# Patient Record
Sex: Female | Born: 1950
Health system: Southern US, Community
[De-identification: ages and names within clinical notes are randomized; demographics above are authoritative.]

## PROBLEM LIST (undated history)

## (undated) DIAGNOSIS — N189 Chronic kidney disease, unspecified: Secondary | ICD-10-CM

## (undated) DIAGNOSIS — F419 Anxiety disorder, unspecified: Secondary | ICD-10-CM

## (undated) DIAGNOSIS — G2581 Restless legs syndrome: Secondary | ICD-10-CM

## (undated) DIAGNOSIS — J9600 Acute respiratory failure, unspecified whether with hypoxia or hypercapnia: Secondary | ICD-10-CM

## (undated) DIAGNOSIS — F32A Depression, unspecified: Secondary | ICD-10-CM

## (undated) DIAGNOSIS — I10 Essential (primary) hypertension: Secondary | ICD-10-CM

## (undated) DIAGNOSIS — Z720 Tobacco use: Secondary | ICD-10-CM

## (undated) DIAGNOSIS — E785 Hyperlipidemia, unspecified: Secondary | ICD-10-CM

## (undated) DIAGNOSIS — J449 Chronic obstructive pulmonary disease, unspecified: Secondary | ICD-10-CM

## (undated) DIAGNOSIS — F329 Major depressive disorder, single episode, unspecified: Secondary | ICD-10-CM

## (undated) DIAGNOSIS — S83209A Unspecified tear of unspecified meniscus, current injury, unspecified knee, initial encounter: Secondary | ICD-10-CM

## (undated) DIAGNOSIS — E119 Type 2 diabetes mellitus without complications: Secondary | ICD-10-CM

## (undated) DIAGNOSIS — E669 Obesity, unspecified: Secondary | ICD-10-CM

## (undated) HISTORY — DX: Essential (primary) hypertension: I10

## (undated) HISTORY — DX: Depression, unspecified: F32.A

## (undated) HISTORY — DX: Hyperlipidemia, unspecified: E78.5

## (undated) HISTORY — DX: Acute respiratory failure, unspecified whether with hypoxia or hypercapnia: J96.00

## (undated) HISTORY — DX: Chronic obstructive pulmonary disease, unspecified: J44.9

## (undated) HISTORY — DX: Unspecified tear of unspecified meniscus, current injury, unspecified knee, initial encounter: S83.209A

## (undated) HISTORY — DX: Chronic kidney disease, unspecified: N18.9

## (undated) HISTORY — DX: Anxiety disorder, unspecified: F41.9

## (undated) HISTORY — DX: Restless legs syndrome: G25.81

## (undated) HISTORY — PX: OTHER SURGICAL HISTORY: SHX169

## (undated) HISTORY — DX: Obesity, unspecified: E66.9

## (undated) HISTORY — DX: Type 2 diabetes mellitus without complications: E11.9

## (undated) HISTORY — PX: CHOLECYSTECTOMY: SHX55

## (undated) HISTORY — DX: Major depressive disorder, single episode, unspecified: F32.9

## (undated) HISTORY — DX: Tobacco use: Z72.0

---

## 1982-09-28 HISTORY — PX: HERNIA REPAIR: SHX51

## 1982-09-28 HISTORY — PX: TUBAL LIGATION: SHX77

## 2007-01-07 ENCOUNTER — Ambulatory Visit: Payer: Self-pay | Admitting: General Surgery

## 2007-01-14 ENCOUNTER — Ambulatory Visit: Payer: Self-pay | Admitting: General Surgery

## 2013-09-28 DIAGNOSIS — J9600 Acute respiratory failure, unspecified whether with hypoxia or hypercapnia: Secondary | ICD-10-CM

## 2013-09-28 HISTORY — DX: Acute respiratory failure, unspecified whether with hypoxia or hypercapnia: J96.00

## 2013-12-13 ENCOUNTER — Inpatient Hospital Stay: Payer: Self-pay | Admitting: Family Medicine

## 2013-12-13 LAB — CBC
HCT: 35.3 % (ref 35.0–47.0)
HGB: 11 g/dL — ABNORMAL LOW (ref 12.0–16.0)
MCH: 28.7 pg (ref 26.0–34.0)
MCHC: 31.3 g/dL — ABNORMAL LOW (ref 32.0–36.0)
MCV: 92 fL (ref 80–100)
Platelet: 282 10*3/uL (ref 150–440)
RBC: 3.86 10*6/uL (ref 3.80–5.20)
RDW: 13.5 % (ref 11.5–14.5)
WBC: 26.6 10*3/uL — ABNORMAL HIGH (ref 3.6–11.0)

## 2013-12-13 LAB — PRO B NATRIURETIC PEPTIDE: B-Type Natriuretic Peptide: 191 pg/mL — ABNORMAL HIGH (ref 0–125)

## 2013-12-13 LAB — COMPREHENSIVE METABOLIC PANEL
Albumin: 3.2 g/dL — ABNORMAL LOW (ref 3.4–5.0)
Alkaline Phosphatase: 102 U/L
Anion Gap: 5 — ABNORMAL LOW (ref 7–16)
BUN: 29 mg/dL — ABNORMAL HIGH (ref 7–18)
Bilirubin,Total: 0.3 mg/dL (ref 0.2–1.0)
Calcium, Total: 9.5 mg/dL (ref 8.5–10.1)
Chloride: 93 mmol/L — ABNORMAL LOW (ref 98–107)
Co2: 36 mmol/L — ABNORMAL HIGH (ref 21–32)
Creatinine: 1.48 mg/dL — ABNORMAL HIGH (ref 0.60–1.30)
EGFR (African American): 44 — ABNORMAL LOW
EGFR (Non-African Amer.): 38 — ABNORMAL LOW
Glucose: 121 mg/dL — ABNORMAL HIGH (ref 65–99)
Osmolality: 275 (ref 275–301)
Potassium: 3 mmol/L — ABNORMAL LOW (ref 3.5–5.1)
SGOT(AST): 20 U/L (ref 15–37)
SGPT (ALT): 16 U/L (ref 12–78)
Sodium: 134 mmol/L — ABNORMAL LOW (ref 136–145)
Total Protein: 7.9 g/dL (ref 6.4–8.2)

## 2013-12-13 LAB — TROPONIN I: Troponin-I: <0.02 ng/mL

## 2013-12-14 LAB — BASIC METABOLIC PANEL
Anion Gap: 2 — ABNORMAL LOW (ref 7–16)
BUN: 32 mg/dL — ABNORMAL HIGH (ref 7–18)
Calcium, Total: 8.8 mg/dL (ref 8.5–10.1)
Chloride: 96 mmol/L — ABNORMAL LOW (ref 98–107)
Co2: 36 mmol/L — ABNORMAL HIGH (ref 21–32)
Creatinine: 1.51 mg/dL — ABNORMAL HIGH (ref 0.60–1.30)
EGFR (African American): 43 — ABNORMAL LOW
EGFR (Non-African Amer.): 37 — ABNORMAL LOW
Glucose: 200 mg/dL — ABNORMAL HIGH (ref 65–99)
Osmolality: 281 (ref 275–301)
Potassium: 3.3 mmol/L — ABNORMAL LOW (ref 3.5–5.1)
Sodium: 134 mmol/L — ABNORMAL LOW (ref 136–145)

## 2013-12-14 LAB — CBC WITH DIFFERENTIAL/PLATELET
Basophil #: 0 10*3/uL (ref 0.0–0.1)
Basophil %: 0.1 %
Eosinophil #: 0 10*3/uL (ref 0.0–0.7)
Eosinophil %: 0 %
HCT: 31.1 % — ABNORMAL LOW (ref 35.0–47.0)
HGB: 10.2 g/dL — ABNORMAL LOW (ref 12.0–16.0)
Lymphocyte #: 0.6 10*3/uL — ABNORMAL LOW (ref 1.0–3.6)
Lymphocyte %: 3.3 %
MCH: 30 pg (ref 26.0–34.0)
MCHC: 32.7 g/dL (ref 32.0–36.0)
MCV: 92 fL (ref 80–100)
Monocyte #: 0.2 x10 3/mm (ref 0.2–0.9)
Monocyte %: 1.3 %
Neutrophil #: 17.1 10*3/uL — ABNORMAL HIGH (ref 1.4–6.5)
Neutrophil %: 95.3 %
Platelet: 242 10*3/uL (ref 150–440)
RBC: 3.4 10*6/uL — ABNORMAL LOW (ref 3.80–5.20)
RDW: 12.8 % (ref 11.5–14.5)
WBC: 17.9 10*3/uL — ABNORMAL HIGH (ref 3.6–11.0)

## 2013-12-14 LAB — HEMOGLOBIN A1C: Hemoglobin A1C: 6.9 % — ABNORMAL HIGH (ref 4.2–6.3)

## 2013-12-15 LAB — BASIC METABOLIC PANEL
Anion Gap: 3 — ABNORMAL LOW (ref 7–16)
BUN: 32 mg/dL — ABNORMAL HIGH (ref 7–18)
Calcium, Total: 8.3 mg/dL — ABNORMAL LOW (ref 8.5–10.1)
Chloride: 104 mmol/L (ref 98–107)
Co2: 31 mmol/L (ref 21–32)
Creatinine: 1.24 mg/dL (ref 0.60–1.30)
EGFR (African American): 54 — ABNORMAL LOW
EGFR (Non-African Amer.): 47 — ABNORMAL LOW
Glucose: 164 mg/dL — ABNORMAL HIGH (ref 65–99)
Osmolality: 286 (ref 275–301)
Potassium: 4.2 mmol/L (ref 3.5–5.1)
Sodium: 138 mmol/L (ref 136–145)

## 2013-12-16 LAB — CBC WITH DIFFERENTIAL/PLATELET
Basophil #: 0 10*3/uL (ref 0.0–0.1)
Basophil %: 0 %
Eosinophil #: 0 10*3/uL (ref 0.0–0.7)
Eosinophil %: 0.1 %
HCT: 33.6 % — ABNORMAL LOW (ref 35.0–47.0)
HGB: 10.7 g/dL — ABNORMAL LOW (ref 12.0–16.0)
Lymphocyte #: 1 10*3/uL (ref 1.0–3.6)
Lymphocyte %: 4.1 %
MCH: 29.5 pg (ref 26.0–34.0)
MCHC: 31.8 g/dL — ABNORMAL LOW (ref 32.0–36.0)
MCV: 93 fL (ref 80–100)
Monocyte #: 0.8 x10 3/mm (ref 0.2–0.9)
Monocyte %: 3.2 %
Neutrophil #: 21.9 10*3/uL — ABNORMAL HIGH (ref 1.4–6.5)
Neutrophil %: 92.6 %
Platelet: 276 10*3/uL (ref 150–440)
RBC: 3.61 10*6/uL — ABNORMAL LOW (ref 3.80–5.20)
RDW: 13.5 % (ref 11.5–14.5)
WBC: 23.7 10*3/uL — ABNORMAL HIGH (ref 3.6–11.0)

## 2013-12-16 LAB — BASIC METABOLIC PANEL
Anion Gap: 4 — ABNORMAL LOW (ref 7–16)
BUN: 32 mg/dL — ABNORMAL HIGH (ref 7–18)
Calcium, Total: 8.7 mg/dL (ref 8.5–10.1)
Chloride: 100 mmol/L (ref 98–107)
Co2: 35 mmol/L — ABNORMAL HIGH (ref 21–32)
Creatinine: 1.21 mg/dL (ref 0.60–1.30)
EGFR (African American): 56 — ABNORMAL LOW
EGFR (Non-African Amer.): 48 — ABNORMAL LOW
Glucose: 171 mg/dL — ABNORMAL HIGH (ref 65–99)
Osmolality: 288 (ref 275–301)
Potassium: 4 mmol/L (ref 3.5–5.1)
Sodium: 139 mmol/L (ref 136–145)

## 2013-12-16 LAB — MAGNESIUM: Magnesium: 2.2 mg/dL

## 2013-12-28 ENCOUNTER — Ambulatory Visit: Payer: Self-pay | Admitting: Cardiovascular Disease

## 2014-01-10 ENCOUNTER — Ambulatory Visit: Payer: Self-pay | Admitting: Cardiovascular Disease

## 2014-02-27 DIAGNOSIS — J449 Chronic obstructive pulmonary disease, unspecified: Secondary | ICD-10-CM | POA: Insufficient documentation

## 2014-06-15 ENCOUNTER — Inpatient Hospital Stay: Payer: Self-pay | Admitting: Internal Medicine

## 2014-06-15 LAB — BASIC METABOLIC PANEL
Anion Gap: 9 (ref 7–16)
BUN: 13 mg/dL (ref 7–18)
Calcium, Total: 9.1 mg/dL (ref 8.5–10.1)
Chloride: 99 mmol/L (ref 98–107)
Co2: 29 mmol/L (ref 21–32)
Creatinine: 1.04 mg/dL (ref 0.60–1.30)
EGFR (African American): >60
EGFR (Non-African Amer.): 58 — ABNORMAL LOW
Glucose: 124 mg/dL — ABNORMAL HIGH (ref 65–99)
Osmolality: 275 (ref 275–301)
Potassium: 3.5 mmol/L (ref 3.5–5.1)
Sodium: 137 mmol/L (ref 136–145)

## 2014-06-15 LAB — CBC
HCT: 40.5 % (ref 35.0–47.0)
HGB: 13 g/dL (ref 12.0–16.0)
MCH: 29.3 pg (ref 26.0–34.0)
MCHC: 32 g/dL (ref 32.0–36.0)
MCV: 91 fL (ref 80–100)
Platelet: 266 10*3/uL (ref 150–440)
RBC: 4.44 10*6/uL (ref 3.80–5.20)
RDW: 13.7 % (ref 11.5–14.5)
WBC: 10.1 10*3/uL (ref 3.6–11.0)

## 2014-06-15 LAB — TROPONIN I: Troponin-I: <0.02 ng/mL

## 2014-06-18 LAB — EXPECTORATED SPUTUM ASSESSMENT W GRAM STAIN, RFLX TO RESP C

## 2014-10-10 DIAGNOSIS — J449 Chronic obstructive pulmonary disease, unspecified: Secondary | ICD-10-CM | POA: Diagnosis not present

## 2014-11-10 DIAGNOSIS — J449 Chronic obstructive pulmonary disease, unspecified: Secondary | ICD-10-CM | POA: Diagnosis not present

## 2014-11-20 DIAGNOSIS — G2581 Restless legs syndrome: Secondary | ICD-10-CM | POA: Diagnosis not present

## 2014-12-09 DIAGNOSIS — J449 Chronic obstructive pulmonary disease, unspecified: Secondary | ICD-10-CM | POA: Diagnosis not present

## 2014-12-17 DIAGNOSIS — R0609 Other forms of dyspnea: Secondary | ICD-10-CM | POA: Diagnosis not present

## 2014-12-17 DIAGNOSIS — J449 Chronic obstructive pulmonary disease, unspecified: Secondary | ICD-10-CM | POA: Diagnosis not present

## 2015-01-09 DIAGNOSIS — J449 Chronic obstructive pulmonary disease, unspecified: Secondary | ICD-10-CM | POA: Diagnosis not present

## 2015-01-19 NOTE — H&P (Signed)
PATIENT NAMSandy Hopkins:  Hopkins, [name] MR#:  [id number] DATE OF BIRTH:  07/03/1951  DATE OF ADMISSION:  06/15/2014  [facility]   CHIEF Hopkins: Shortness of breath and cough for 3 days.   HISTORY OF PRESENT ILLNESS: A 64 year old, morbidly obese, Caucasian female patient with history of COPD, chronic respiratory failure on 2 liters home oxygen, who presents to the Emergency Room complaining of worsening shortness of breath, cough, and clear sputum of 3 days. The patient mentions that also her daughter has been sick Lindsey home a cold. She was hoping that she would improve, but has worsened and presented to the Emergency Room. Here, the patient has received a dose of Solu-Medrol 125 IV x1 along with 3 rounds of nebulizer along with a nebulizer earlier Lindsey home with no significant improvement, needing 4 liters oxygen and is being admitted to the hospitalist service. The patient is afebrile. Chest x-ray does not show any pneumonia. The patient was last admitted to the hospital in March of 2015 with a similar episode. The patient has done well, has been following with her primary care physician and Dr. Meredeth IdeFleming with pulmonary. She has not used any recent antibiotics or steroids.   The patient used to smoke in the past, but quit smoking 2 weeks back.   PAST MEDICAL HISTORY:  1.  COPD.  2.  Chronic respiratory failure on 2 liters oxygen.  3.  Hypertension.  4.  Diabetes mellitus controlled with diet.   5.  Neuropathy.  6.  Restless leg syndrome.  7.  Hyperlipidemia.  8.  Morbid obesity.   ALLERGIES: HYDROCODONE, RASH.    PAST SURGICAL HISTORY: Cholecystectomy.   FAMILY HISTORY: Of MI in her brother who had CABG.  No cancer in the family.  CVA in her mom.   SOCIAL HISTORY: The patient used to smoke in the past, and has quit 2 weeks back. This is her second attempt Lindsey quitting smoking for which I have encouraged her. Does not use any illicit drug use. Lives [place] home with her  daughter and granddaughter. Ambulates on her own, uses 2 liters oxygen.   CODE STATUS: DNR/DNI.   HOME MEDICATIONS ARE: 1.  Albuterol.  2.  Aspirin 81 mg daily.  3.  Atenolol 50 mg daily.  4.  Calcium vitamin D 1 tablet daily.  5.  Cozaar 50 mg daily.  7.  Gabapentin 100 mg oral 3 times a day.  8.  Hydrochlorothiazide 25 mg. 9.  Lorazepam 0.5 mg oral daily.  10.  Pravastatin 40 mg daily.  11.  Ropinirole 0.25 mg oral 2 times a day.  12.  Spiriva 18 mcg inhaled once a day.  13.  Symbicort 2 puffs inhaled 2 times a day.   REVIEW OF SYSTEMS:  CONSTITUTIONAL:  Complains of severe fatigue, weakness.  EYES: No blurred vision, pain, or redness.  ENT: No tinnitus, ear pain, or hearing loss.  RESPIRATORY: Has cough, wheeze, clear sputum; has COPD.  CARDIOVASCULAR: No chest pain, orthopnea, edema.  GASTROINTESTINAL: No nausea, vomiting, diarrhea, abdominal pain.  GENITOURINARY: No dysuria, hematuria, or frequency.  ENDOCRINE: No polyuria, nocturia, or thyroid problems. LYMPHATICS: No anemia, easy bruising, bleeding.  INTEGUMENTARY: No acne, rash, lesion.  MUSCULOSKELETAL: No back pain, arthritis.  NEUROLOGIC: No focal numbness, weakness, seizure. Marland Kitchen. PSYCHIATRIC: No anxiety or depression.   LABORATORY STUDIES: Show glucose of 124, BUN 13, creatinine 1.04, sodium 137, potassium 3.5, chloride 99, bicarbonate 29. GFR 58. Troponin less than 0.02. WBC  18.1, hemoglobin 13, platelets of 266,000.   IMAGING:  EKG shows normal sinus rhythm, nothing acute. Chest x-ray shows emphysematous changes, COPD, nothing acute.   ASSESSMENT AND PLAN: 1.  Acute on chronic respiratory failure secondary to acute chronic obstructive pulmonary disease exacerbation. The patient will be started on IV Solu-Medrol along with scheduled nebulizers and antibiotics. The patient had multiple rounds of DuoNeb in the Emergency Room along with IV steroids with no significant improvement on 4 liters oxygen. We will wean her off  oxygen as needed. She is on 2 liters of oxygen [place] home. She did quit smoking 2 weeks prior for which I have encouraged her to stay away from smoking.  2.  Hypertension. Continue home medications.  3.  Diet-controlled diabetes mellitus. Put her on sliding scale insulin, diabetic diet. Blood sugars will likely be elevated secondary to being on IV steroids.  4.  Deep vein thrombosis prophylaxis with Lovenox.  5.  Code status is DNR/DNI.   TIME SPENT TODAY ON THIS CASE: Was 45 minutes.    ____________________________ Lindsey Bailiff Lempi Edwin, MD srs:lr D: 06/15/2014 18:26:04 ET T: 06/15/2014 18:57:10 ET JOB#: [id number]  cc: Wardell Heath R. Tenae Graziosi, MD, <Dictator> [facility] Herbon E. Meredeth Ide, MD Orie Fisherman MD ELECTRONICALLY SIGNED 06/20/2014 16:23

## 2015-01-19 NOTE — Discharge Summary (Signed)
PATIENT NAME:  [name], [name] MR#:  [id number] DATE OF BIRTH:  26-Dec-1950   DISCHARGE DIAGNOSIS:  Chronic obstructive pulmonary disease exacerbation, now improving.  SECONDARY DIAGNOSES: 1.  Chronic obstructive pulmonary disease.  2.  Chronic respiratory failure on 2 liters oxygen. 3.  Hypertension. 4.  Diabetes mellitus, diet controlled.  5.  Neuropathy.  6.  Restless leg syndrome.  7.  Hyperlipidemia.  8.  Morbid obesity.   CONSULTATIONS: None.   PROCEDURES AND RADIOLOGY: Chest x-ray on 18th of September showed no acute abnormality. COPD seen.   MAJOR LABORATORY PANEL: Sputum culture grew normal flora.   HISTORY AND SHORT HOSPITAL COURSE: The patient is a 64 year old female with above-mentioned medical problems who was admitted for shortness of breath and cough for 3 days. She was found to have acute on COPD exacerbation with underlying chronic respiratory failure. Please see Dr. Eddie NorthSudini's dictated history and physical for further details. The patient was started on IV Solu-Medrol nebulizer breathing treatment and was having a slow improvement. She was also on some cough medicine which seemed to help her, along with empiric antibiotic. By 21st of September, she was close to her baseline and was discharged home in stable condition.   PERTINENT PHYSICAL EXAMINATION: VITAL SIGNS: On the date of discharge, her temperature is 98.1, heart rate 97 per minute, respirations 18 per minute, blood pressure 115/69 mmHg. She is saturating 92% on 2 liters oxygen via nasal cannula.  CARDIOVASCULAR: S1, S2 normal. No murmurs, rubs, or gallops. LUNGS: Clear to auscultation bilaterally. No wheezing, rales, rhonchi, or crepitation.  ABDOMEN: Soft, benign.  NEUROLOGIC: Nonfocal examination. All the physical examination remained at baseline.  DISCHARGE MEDICATIONS: 1.  Hydrochlorothiazide 25 mg p.o. daily. 2.  Gabapentin 100 mg p.o. 3 times a day.  3.  Aspirin 81 mg p.o. daily. 4.  Albuterol 2 puffs inhaled  4-6 times a day as needed.  5.  Albuterol nebulizer every 4-6 hours as needed.  6.  Daliresp 500 mcg 1 tablet p.o. daily. 7.  Symbicort 2 puffs inhaled twice a day.  8.  Spiriva once daily.  9.  Calcium with vitamin D 1 tablet p.o. daily.  10.  Atenolol 50 mg p.o. at bedtime. 11.  Cozaar 50 mg p.o. at bedtime.  12.  Lorazepam 0.5 mg p.o. at bedtime. 13.  Ropinirole 0.25 mg p.o. b.i.d. 14.  Pravastatin 40 mg p.o. at bedtime.  15.  Levaquin 250 mg p.o. daily for 3 days.  16.  Colace 100 mg p.o. at bedtime for 7 days as needed.  17.  Prednisone 60 mg p.o. daily, taper 10 mg daily until finished. 18.  Benzonatate 100 mg p.o. 3 times a day for 5 days.  19.  Tussionex 5 mL p.o. b.i.d. for 5 days.   DISCHARGE DIET:  Low Sodium    DISCHARGE ACTIVITY: As tolerated.   DISCHARGE INSTRUCTIONS AND FOLLOWUP:  1.  The patient was instructed to follow up with her primary physician, Dr. Vonita MossMark Crissman in 1-2 weeks.  2.  She will need to follow with Dr. Meredeth IdeFleming in 2-4 weeks. She was set up to get 2 liters oxygen via nasal cannula continuous at home as per her chronic need.  TOTAL TIME DISCHARGING THIS PATIENT: Forty-five minutes.    ____________________________ Ellamae SiaVipul S. Sherryll BurgerShah, MD vss:LT D: 06/20/2014 16:07:08 ET T: 06/20/2014 21:41:06 ET JOB#: [id number]  cc: Nazia Rhines S. Sherryll BurgerShah, MD, <Dictator> Steele SizerMark A. Crissman, MD Herbon E. Meredeth IdeFleming, MD Ellamae SiaVIPUL S [facility] MD ELECTRONICALLY SIGNED 06/23/2014 17:10

## 2015-01-19 NOTE — Discharge Summary (Signed)
PATIENT NAMNedra Hai:  Hopkins, [name] MR#:  [id number] DATE OF BIRTH:  03/03/1951  DATE OF ADMISSION:  12/13/2013 DATE OF DISCHARGE:  12/17/2013  PRIMARY CARE PHYSICIAN: Dr. Dossie Arbourrissman   DISCHARGE DIAGNOSES: 1.  Acute on chronic respiratory failure secondary to chronic obstructive pulmonary disease exacerbation and bronchitis.  2.  Acute renal failure.  3.  Hyponatremia.  4.  Hypertension.  5.  Diabetes.    CODE STATUS: Full code.   CONDITION: Stable.   HOME MEDICATIONS: [facility] instruction medication reconciliation list. The patient will continue home oxygen 2 liters by nasal cannula.   DIET: Low-sodium, low-fat, low-cholesterol, ADA diet.   ACTIVITY: As tolerated.   FOLLOW-UP CARE: Follow with PCP within 1 to 2 weeks.   REASON FOR ADMISSION: Shortness of breath.   HOSPITAL COURSE: The patient is a 64 year old Caucasian female with a history of chronic obstructive pulmonary disease with chronic respiratory failure, who came to the ED due to cough, fever, shortness of breath. The patient's oxygen saturation decreased to 88 on 4 liters oxygen in the ED. For detailed history and physical examination, please refer to the admission note dictated by Dr. Mordecai MaesSanchez. The patient's chest x-ray on admission date did not show any infiltrates. Laboratory data on admission date showed WBC 26, hemoglobin 11. BNP 199. ABG showed pH of 7.38, pO2 75, pCO2 of 66. Chest x-ray showed chronic obstructive pulmonary disease with hyperinflation of the lungs, negative for pneumonia. After the acute on chronic respiratory failure secondary to chronic obstructive pulmonary disease exacerbation, the patient has been treated with IV Solu-Medrol nebulizer and oxygen by nasal cannula. The patient's oxygen was 4 liters, then weaning down to 2 liters by nasal cannula. The patient's shortness of breath, cough, sputum has been improving. Physical examination shows lung sounds are weak, but no wheezing or  crackles.   1.  SIRS, which is possibly due to chronic obstructive pulmonary disease exacerbation and bronchitis. The patient was treated with Levaquin and has improved.  2.  Leukocytosis, which is possibly chronic obstructive pulmonary disease with bronchitis, and  steroid.  3.  Hypokalemia, improved after supplement.  4.  Acute renal failure. The patient's creatinine increased to 1.51. After IV fluid support, decreased to 1.24, resolved.  5.  The patient's vital signs are stable. She is clinically stable and will be discharged to home today. I discussed the patient's discharge plan with the patient and nurse.   TIME SPENT: About 38 minutes.    ____________________________ Lindsey PollackQing Dhruva Orndoff, MD qc:cg D: 12/17/2013 15:08:26 ET T: 12/18/2013 01:34:47 ET JOB#: [id number]  cc: Lindsey PollackQing Marcel Gary, MD, <Dictator> Lindsey PollackQING Lynna Zamorano MD ELECTRONICALLY SIGNED 12/18/2013 17:27

## 2015-01-19 NOTE — H&P (Signed)
PATIENT NAMEMarsi Hopkins, [name] MR#:  [id number] DATE OF BIRTH:  25-Sep-1951  DATE OF ADMISSION:  12/13/2013  REASON FOR ADMISSION: COPD exacerbation, acute-on-chronic respiratory failure.   CHIEF COMPLAINT: Shortness of breath.   REFERRING PHYSICIAN: Lowella Fairy, MD  HISTORY OF PRESENT ILLNESS: This is a very nice 64 year old female who has history of COPD with chronic respiratory failure on 2 liters of oxygen nasal cannula over 2 years. The patient comes today complaining of cough, fever, shortness of breath, and not able to ambulate very well due to that problem.   The patient apparently has been coughing for over a week, having significant increase of respiratory secretions which are colored green. She states that she had a fever of 101.5 at home. Here, the patient is afebrile but during the time that she has been in the ER, she has desaturated to 88% on 4 liters nasal cannula which she requires only 2 liters at home. She is going to be admitted and treated with steroids, antibiotics and nebulizers.   REVIEW OF SYSTEMS: A 12-system review of systems is done.  CONSTITUTIONAL: Positive fever. Denies any fatigue or weakness.  EYES: No blurry vision, double vision.  ENT: No tinnitus or hearing loss.  RESPIRATORY: Positive cough. Positive wheezing. No hemoptysis. Positive dyspnea. Positive COPD.  CARDIOVASCULAR: No chest pain, orthopnea, or syncope  GASTROINTESTINAL: No nausea, vomiting, abdominal pain, constipation, or diarrhea.  GENITOURINARY: No dysuria, hematuria.  GYNECOLOGIC: No breast masses.  HEMOLYMPHATIC: No anemia, easy bruising or bleeding.  ENDOCRINE: No polyuria, polydipsia, polyphagia.  SKIN: No rashes or petechiae.  MUSCULOSKELETAL: No significant neck pain, back pain or gout.  NEUROLOGIC: No numbness, tingling, or CVAs.  PSYCHIATRIC: No insomnia or depression.   PAST MEDICAL HISTORY:  1.  COPD.  2.  Hypertension.  3.  Diabetes mellitus, off of medications right now.  4.   Neuropathy.  5.  Restless leg syndrome.  6.  Hyperlipidemia.  7.  Chronic respiratory failure, on 2 liters of oxygen.   ALLERGIES: HYDROCODONE GIVES HER NAUSEA, VOMITING, AND RASH.   PAST SURGICAL HISTORY: Cholecystectomy.   FAMILY HISTORY: MI in her brother who had a CABG. No cancer in the family. CVA in her mom.   SOCIAL HISTORY: The patient apparently quit smoking in 2014 around May but now recently she started smoking again. Her last cigarette was a couple of days ago. Smoking cessation counseling given to the patient for over 4 minutes and she agrees that she needs to quit. Help was offered to quitting smoking, but she says she can do it herself. The patient lives with her daughter and son-in-law. Her son-in-law apparently is unemployed and he stays in bed all day. Apparently, he might be depressed and they are trying to help him and she has been wearing out  herself by taking care of her grandson.   MEDICATIONS: Aspirin 81 mg daily, Cozaar 50 mg daily, lorazepam 0.4 mg once a day as needed for anxiety or sleeping, clonazepam 1 mg p.r.n., gabapentin 100 mg three times a day, fluoxetine 20 mg once a day, atenolol 50 mg once a day, albuterol Atrovent nebs once daily, Spiriva 18 mcg once a day, Symbicort 160/4.5 mg two puffs twice daily, hydrochlorothiazide 25 mg once a day, Daliresp 500 mg oral tablet.   PHYSICAL EXAMINATION:  VITAL SIGNS: Blood pressure 121/61, heart rate 96, respirations 26, temperature 98.3.  GENERAL: Alert and oriented x3, in no acute distress. Has mild respiratory distress when she talks,  ENT: Pupils  are equal and reactive. Extraocular movements are intact. Mucosae are moist. Anicteric sclerae. Pink conjunctivae. No oral lesions. No oropharyngeal exudates.  NECK: Supple. No JVD. No thyromegaly. No adenopathy. No carotid bruits.  CARDIOVASCULAR: Regular rate and rhythm. No murmurs, rubs or gallops. No displacement of PMI.  LUNGS: Positive rhonchi, diffuse in both  respiratory fields. The patient was wheezing earlier but she has got respiratorydistress . Positive use of accessory muscles when she moves or talks. No dullness to percussion.  ABDOMEN: Soft, nontender, nondistended. No hepatosplenomegaly. No masses. Bowel sounds are positive.  GENITAL: Deferred.  EXTREMITIES: No edema, cyanosis or clubbing. Pulses +2. Capillary refill less than 3.  MUSCULOSKELETAL: No joint effusions or joint swelling.  NEUROLOGIC: Cranial nerves II through XII intact. Strength is five out of five in all four extremities. No focal findings.  PSYCHIATRIC: No agitation. The patient is alert and oriented x3.  LYMPHATICS: Negative for lymphadenopathy in the neck or supraclavicular areas.  SKIN: No rashes or petechiae.   LABORATORY, DIAGNOSTIC AND RADIOLOGICAL DATA:  CHEST X-RAY: No acute abnormalities.   Shows pH 7.38, pO2 75, pCO2 66. White count 26,000, hemoglobin 11, platelet count 282, albumin 3.2. Her BNP is 191. Glucose 121, BUN 29, creatinine 1.48, sodium 134, potassium 3, chloride 93. GFR around 44.   CHEST X-RAY: When looking at the chest x-ray myself, I think there is a slight increase in density at the level of the left costovertebral angle. Could be secondary to effusion versus atelectasis versus new infiltrate.   ASSESSMENT AND PLAN: A 64 year old female with history of chronic obstructive pulmonary disease, chronic respiratory failure, hyperlipidemia, neuropathy, diabetes, hypertension, who comes with worsening shortness of breath and hypoxic.  1.  Acute-on-chronic respiratory failure secondary to chronic obstructive pulmonary disease exacerbation. Treated with steroids, treated with nebulizers, admitted for inpatient treatment. The patient has 2 liters of oxygen at home, right now is requiring 4, and she desaturated down to 88 by moving around. Continue to monitor closely. Provide oxygen as needed.  2.  Systemic inflammatory response syndrome. The patient has a white  blood count above 20,000, tachycardic, tachypneic. Likely secondary to chronic obstructive pulmonary disease exacerbation/acute bronchitis versus new beginnings of pneumonia. The patient treated with antibiotics. Since there is no pneumonia, blood cultures were not taken. No sputum culture necessary at this moment.  3.  Chronic obstructive pulmonary disease exacerbation as mentioned above. No evident pneumonia although chest x-ray shows possible atelectasis versus light pleural effusions on the left side. Continue to monitor. The patient is afebrile. Continue treatment with Levaquin.  4.  Hypertension. The patient is stable. Continue atenolol and losartan.  5.  Neuropathy. Continue gabapentin.  6.  Restless leg syndrome. The patient is stable. Continue pramipexole.  7.  Depression. The patient on Prozac.  8.  Hypokalemia. Replace with IV fluids.  9.  Increased creatinine. At this moment, I do not have a baseline and I do not know if this is acute or chronic, but her creatinine is 1.48. Her GFR is around 44%. Provide IV fluids with potassium replacement.  10.  Hyponatremia, with sodium 134, likely secondary to intravascular volume depletion. Provide IV fluids.  11.  CO2 of 36, chronic respiratory acidosis. Consider the use of Diamox.  12.  Other medical problems are stable.  13.  Deep vein thrombosis prophylaxis with Lovenox, renal dose, and gastrointestinal prophylaxis with Protonix.   TOTAL TIME SPENT: I spent about 45 minutes with this patient.   CODE STATUS: FULL CODE.  ____________________________ Felipa Furnaceoberto Sanchez Gutierrez, MD rsg:np D: 12/13/2013 15:39:03 ET T: 12/13/2013 16:24:32 ET JOB#: [id number]  cc: Felipa Furnaceoberto Sanchez Gutierrez, MD, <Dictator> Taden Witter Juanda ChanceSANCHEZ GUTIERRE MD ELECTRONICALLY SIGNED 12/24/2013 13:40

## 2015-02-08 DIAGNOSIS — J449 Chronic obstructive pulmonary disease, unspecified: Secondary | ICD-10-CM | POA: Diagnosis not present

## 2015-03-11 DIAGNOSIS — J449 Chronic obstructive pulmonary disease, unspecified: Secondary | ICD-10-CM | POA: Diagnosis not present

## 2015-03-22 ENCOUNTER — Encounter: Payer: Self-pay | Admitting: Family Medicine

## 2015-03-22 ENCOUNTER — Ambulatory Visit (INDEPENDENT_AMBULATORY_CARE_PROVIDER_SITE_OTHER): Payer: Medicare Other | Admitting: Family Medicine

## 2015-03-22 VITALS — BP 90/63 | HR 99 | Temp 98.0°F | Ht 62.0 in | Wt 222.0 lb

## 2015-03-22 DIAGNOSIS — N189 Chronic kidney disease, unspecified: Secondary | ICD-10-CM | POA: Insufficient documentation

## 2015-03-22 DIAGNOSIS — E785 Hyperlipidemia, unspecified: Secondary | ICD-10-CM | POA: Diagnosis not present

## 2015-03-22 DIAGNOSIS — H409 Unspecified glaucoma: Secondary | ICD-10-CM

## 2015-03-22 DIAGNOSIS — N182 Chronic kidney disease, stage 2 (mild): Secondary | ICD-10-CM | POA: Diagnosis not present

## 2015-03-22 DIAGNOSIS — I129 Hypertensive chronic kidney disease with stage 1 through stage 4 chronic kidney disease, or unspecified chronic kidney disease: Secondary | ICD-10-CM | POA: Diagnosis not present

## 2015-03-22 DIAGNOSIS — J441 Chronic obstructive pulmonary disease with (acute) exacerbation: Secondary | ICD-10-CM | POA: Diagnosis not present

## 2015-03-22 DIAGNOSIS — F419 Anxiety disorder, unspecified: Secondary | ICD-10-CM | POA: Diagnosis not present

## 2015-03-22 DIAGNOSIS — E119 Type 2 diabetes mellitus without complications: Secondary | ICD-10-CM | POA: Insufficient documentation

## 2015-03-22 DIAGNOSIS — R7301 Impaired fasting glucose: Secondary | ICD-10-CM | POA: Diagnosis not present

## 2015-03-22 LAB — CBC WITH DIFFERENTIAL/PLATELET
Hematocrit: 39 % (ref 34.0–46.6)
Hemoglobin: 12.4 g/dL (ref 11.1–15.9)
Lymphocytes Absolute: 3 10*3/uL (ref 0.7–3.1)
Lymphs: 20 %
MCH: 30.2 pg (ref 26.6–33.0)
MCHC: 31.8 g/dL (ref 31.5–35.7)
MCV: 95 fL (ref 79–97)
MID (Absolute): 1 10*3/uL (ref 0.1–1.6)
MID: 7 %
Neutrophils Absolute: 11.3 10*3/uL — ABNORMAL HIGH (ref 1.4–7.0)
Neutrophils: 74 %
Platelets: 284 10*3/uL (ref 150–379)
RBC: 4.1 x10E6/uL (ref 3.77–5.28)
RDW: 14.1 % (ref 12.3–15.4)
WBC: 15.3 10*3/uL — ABNORMAL HIGH (ref 3.4–10.8)

## 2015-03-22 LAB — MICROALBUMIN, URINE WAIVED
Creatinine, Urine Waived: 100 mg/dL (ref 10–300)
Microalb, Ur Waived: 30 mg/L — ABNORMAL HIGH (ref 0–19)
Microalb/Creat Ratio: <30 mg/g (ref <30)

## 2015-03-22 LAB — LIPID PANEL PICCOLO, WAIVED
Chol/HDL Ratio Piccolo,Waive: 2.8 mg/dL
Cholesterol Piccolo, Waived: 158 mg/dL (ref <200)
HDL Chol Piccolo, Waived: 57 mg/dL — ABNORMAL LOW (ref >59)
LDL Chol Calc Piccolo Waived: 66 mg/dL (ref <100)
Triglycerides Piccolo,Waived: 176 mg/dL — ABNORMAL HIGH (ref <150)
VLDL Chol Calc Piccolo,Waive: 35 mg/dL — ABNORMAL HIGH (ref <30)

## 2015-03-22 LAB — BAYER DCA HB A1C WAIVED: HB A1C (BAYER DCA - WAIVED): 6.3 % (ref <7.0)

## 2015-03-22 MED ORDER — ATENOLOL 50 MG PO TABS: 50.0000 mg | ORAL_TABLET | Freq: Every day | ORAL | Status: DC

## 2015-03-22 MED ORDER — ROPINIROLE HCL 0.25 MG PO TABS: 0.2500 mg | ORAL_TABLET | Freq: Three times a day (TID) | ORAL | Status: DC

## 2015-03-22 MED ORDER — HYDROCHLOROTHIAZIDE 25 MG PO TABS: 25.0000 mg | ORAL_TABLET | Freq: Every day | ORAL | Status: DC

## 2015-03-22 MED ORDER — LOSARTAN POTASSIUM 50 MG PO TABS: 50.0000 mg | ORAL_TABLET | Freq: Every day | ORAL | Status: DC

## 2015-03-22 MED ORDER — GABAPENTIN 100 MG PO CAPS: 200.0000 mg | ORAL_CAPSULE | Freq: Three times a day (TID) | ORAL | Status: DC

## 2015-03-22 MED ORDER — CLONAZEPAM 1 MG PO TABS: 1.0000 mg | ORAL_TABLET | Freq: Every day | ORAL | Status: DC | PRN

## 2015-03-22 MED ORDER — BUPROPION HCL ER (SMOKING DET) 150 MG PO TB12: 300.0000 mg | ORAL_TABLET | Freq: Every day | ORAL | Status: DC

## 2015-03-22 MED ORDER — PREDNISONE 10 MG PO TABS: ORAL_TABLET | ORAL | Status: DC

## 2015-03-22 MED ORDER — PRAVASTATIN SODIUM 40 MG PO TABS: 40.0000 mg | ORAL_TABLET | Freq: Every day | ORAL | Status: DC

## 2015-03-22 MED ORDER — AZITHROMYCIN 250 MG PO TABS: ORAL_TABLET | ORAL | Status: DC

## 2015-03-22 NOTE — Assessment & Plan Note (Signed)
Currently on a ARB. Checked CMP and microalbumin today. Continue to monitor.

## 2015-03-22 NOTE — Assessment & Plan Note (Signed)
Under great control at this time. Continue current medication. Refill of pravastatin given today. Continue to monitor.

## 2015-03-22 NOTE — Patient Instructions (Signed)
DASH Eating Plan °DASH stands for "Dietary Approaches to Stop Hypertension." The DASH eating plan is a healthy eating plan that has been shown to reduce high blood pressure (hypertension). Additional health benefits may include reducing the risk of type 2 diabetes mellitus, heart disease, and stroke. The DASH eating plan may also help with weight loss. °WHAT DO I NEED TO KNOW ABOUT THE DASH EATING PLAN? °For the DASH eating plan, you will follow these general guidelines: °· Choose foods with a percent daily value for sodium of less than 5% (as listed on the food label). °· Use salt-free seasonings or herbs instead of table salt or sea salt. °· Check with your health care provider or pharmacist before using salt substitutes. °· Eat lower-sodium products, often labeled as "lower sodium" or "no salt added." °· Eat fresh foods. °· Eat more vegetables, fruits, and low-fat dairy products. °· Choose whole grains. Look for the word "whole" as the first word in the ingredient list. °· Choose fish and skinless chicken or turkey more often than red meat. Limit fish, poultry, and meat to 6 oz (170 g) each day. °· Limit sweets, desserts, sugars, and sugary drinks. °· Choose heart-healthy fats. °· Limit cheese to 1 oz (28 g) per day. °· Eat more home-cooked food and less restaurant, buffet, and fast food. °· Limit fried foods. °· Cook foods using methods other than frying. °· Limit canned vegetables. If you do use them, rinse them well to decrease the sodium. °· When eating at a restaurant, ask that your food be prepared with less salt, or no salt if possible. °WHAT FOODS CAN I EAT? °Seek help from a dietitian for individual calorie needs. °Grains °Whole grain or whole wheat bread. Brown rice. Whole grain or whole wheat pasta. Quinoa, bulgur, and whole grain cereals. Low-sodium cereals. Corn or whole wheat flour tortillas. Whole grain cornbread. Whole grain crackers. Low-sodium crackers. °Vegetables °Fresh or frozen vegetables  (raw, steamed, roasted, or grilled). Low-sodium or reduced-sodium tomato and vegetable juices. Low-sodium or reduced-sodium tomato sauce and paste. Low-sodium or reduced-sodium canned vegetables.  °Fruits °All fresh, canned (in natural juice), or frozen fruits. °Meat and Other Protein Products °Ground beef (85% or leaner), grass-fed beef, or beef trimmed of fat. Skinless chicken or turkey. Ground chicken or turkey. Pork trimmed of fat. All fish and seafood. Eggs. Dried beans, peas, or lentils. Unsalted nuts and seeds. Unsalted canned beans. °Dairy °Low-fat dairy products, such as skim or 1% milk, 2% or reduced-fat cheeses, low-fat ricotta or cottage cheese, or plain low-fat yogurt. Low-sodium or reduced-sodium cheeses. °Fats and Oils °Tub margarines without trans fats. Light or reduced-fat mayonnaise and salad dressings (reduced sodium). Avocado. Safflower, olive, or canola oils. Natural peanut or almond butter. °Other °Unsalted popcorn and pretzels. °The items listed above may not be a complete list of recommended foods or beverages. Contact your dietitian for more options. °WHAT FOODS ARE NOT RECOMMENDED? °Grains °White bread. White pasta. White rice. Refined cornbread. Bagels and croissants. Crackers that contain trans fat. °Vegetables °Creamed or fried vegetables. Vegetables in a cheese sauce. Regular canned vegetables. Regular canned tomato sauce and paste. Regular tomato and vegetable juices. °Fruits °Dried fruits. Canned fruit in light or heavy syrup. Fruit juice. °Meat and Other Protein Products °Fatty cuts of meat. Ribs, chicken wings, bacon, sausage, bologna, salami, chitterlings, fatback, hot dogs, bratwurst, and packaged luncheon meats. Salted nuts and seeds. Canned beans with salt. °Dairy °Whole or 2% milk, cream, half-and-half, and cream cheese. Whole-fat or sweetened yogurt. Full-fat   cheeses or blue cheese. Nondairy creamers and whipped toppings. Processed cheese, cheese spreads, or cheese  curds. °Condiments °Onion and garlic salt, seasoned salt, table salt, and sea salt. Canned and packaged gravies. Worcestershire sauce. Tartar sauce. Barbecue sauce. Teriyaki sauce. Soy sauce, including reduced sodium. Steak sauce. Fish sauce. Oyster sauce. Cocktail sauce. Horseradish. Ketchup and mustard. Meat flavorings and tenderizers. Bouillon cubes. Hot sauce. Tabasco sauce. Marinades. Taco seasonings. Relishes. °Fats and Oils °Butter, stick margarine, lard, shortening, ghee, and bacon fat. Coconut, palm kernel, or palm oils. Regular salad dressings. °Other °Pickles and olives. Salted popcorn and pretzels. °The items listed above may not be a complete list of foods and beverages to avoid. Contact your dietitian for more information. °WHERE CAN I FIND MORE INFORMATION? °National Heart, [employer], [employer] Institute: www.nhlbi.nih.gov/health/health-topics/topics/dash/ °Document Released: 09/03/2011 Document Revised: 01/29/2014 Document Reviewed: 07/19/2013 °ExitCare® Patient Information ©2015 [employer]. This information is not intended to replace advice given to you by your health care provider. Make sure you discuss any questions you have with your health care provider. ° °

## 2015-03-22 NOTE — Progress Notes (Signed)
BP 90/63 mmHg  Pulse 99  Temp(Src) 98 F (36.7 C)  Ht  (1.575 m)  Wt 222 lb (100.699 kg)  BMI 40.59 kg/m2  SpO2 84%  LMP    Subjective:    Patient ID: [name], female    DOB: [birth year], 64 y.o.   MRN: [id number]  HPI: [name] is a 64 y.o. female who presents today for follow up. She did not bring her home oxygen with her today. She states her pulse ox is better at home when she is on it.   Chief Complaint  Patient presents with  . Hyperlipidemia  . Hypertension  . Anxiety   UPPER RESPIRATORY TRACT INFECTION x 2 weeks Worst symptom: cough Fever: yes- subjective Cough: yes Shortness of breath: yes Wheezing: yes Chest pain: no Chest tightness: yes Chest congestion: yes Nasal congestion: yes Runny nose: yes Post nasal drip: yes Sneezing: yes Sore throat: yes Swollen glands: no Sinus pressure: no Headache: no Face pain: no Toothache: no Ear pain: no  Ear pressure: no  Eyes red/itching:no Eye drainage/crusting: no  Vomiting: no Rash: no Fatigue: no Sick contacts: no Strep contacts: no  Context: worse Recurrent sinusitis: no Relief with OTC cold/cough medications: no  Treatments attempted: none   HYPERTENSION / HYPERLIPIDEMIA Satisfied with current treatment? yes Duration of hypertension: chronic BP monitoring frequency: not checking BP medication side effects: no Duration of hyperlipidemia: chronic Cholesterol medication side effects: no Cholesterol supplements: none Past cholesterol medications: pravastatin (pravachol) Medication compliance: good compliance Aspirin: yes Recent stressors: yes Recurrent headaches: no Visual changes: no Palpitations: no Dyspnea: yes Chest pain: no Lower extremity edema: no Dizzy/lightheaded: no  ANXIETY/STRESS Duration:exacerbated Anxious mood: yes  Excessive worrying: yes Irritability: yes  Sweating: yes Nausea: no Palpitations:no Hyperventilation: no Panic attacks: no Agoraphobia: no   Obscessions/compulsions: no Depressed mood: no GAD7: 19 Anhedonia: no Weight changes: no Insomnia: yes hard to stay asleep  Hypersomnia: no Fatigue/loss of energy: yes Feelings of worthlessness: no Feelings of guilt: no Impaired concentration/indecisiveness: yes Suicidal ideations: no  Crying spells: no Recent Stressors/Life Changes: yes   Relationship problems: no   Family stress: yes     Financial stress: yes    Job stress: no    Recent death/loss: no  Impaired Fasting Glucose HbA1C: 6.3 Duration of elevated blood sugar: unknown Polydipsia: no Polyuria: no Weight change: no Visual disturbance: no Glucose Monitoring: no    Accucheck frequency: Not Checking Diabetic Education: Not Completed Family history of diabetes: yes  Relevant past medical, surgical, family and social history reviewed and updated as indicated. Interim medical history since our last visit reviewed. Allergies and medications reviewed and updated.  Review of Systems  Constitutional: Negative.   HENT: Negative.   Respiratory: Negative.   Cardiovascular: Negative.   Neurological: Negative.   Psychiatric/Behavioral: Negative.     Per HPI unless specifically indicated above     Objective:    BP 90/63 mmHg  Pulse 99  Temp(Src) 98 F (36.7 C)  Ht  (1.575 m)  Wt 222 lb (100.699 kg)  BMI 40.59 kg/m2  SpO2 84%  LMP  94% following being on o2 Wt Readings from Last 3 Encounters:  03/22/15 222 lb (100.699 kg)  11/20/14 223 lb (101.152 kg)    Physical Exam  Constitutional: She is oriented to person, place, and time. She appears well-developed and well-nourished. No distress.  HENT:  Head: Normocephalic and atraumatic.  Right Ear: Hearing and external ear normal.  Left Ear: Hearing  and external ear normal.  Nose: Nose normal.  Mouth/Throat: Oropharynx is clear and moist. No oropharyngeal exudate.  Eyes: Conjunctivae and lids are normal. Right eye exhibits no discharge. Left eye  exhibits no discharge. No scleral icterus.  Cardiovascular: Normal rate, regular rhythm and normal heart sounds.  Exam reveals no gallop and no friction rub.   No murmur heard. Pulmonary/Chest: No respiratory distress. She has wheezes. She has rales.  Decreased breath sounds, increased work of breathing, scattered wheezes throughout  Musculoskeletal: Normal range of motion.  Neurological: She is alert and oriented to person, place, and time.  Skin: Skin is intact. No rash noted.  Psychiatric: She has a normal mood and affect. Her speech is normal and behavior is normal. Judgment and thought content normal. Cognition and memory are normal.        Assessment & Plan:   Problem List Items Addressed This Visit      Endocrine   Impaired fasting glucose    A1c 6.3 today. Continue diet. Continue to monitor.       Relevant Orders   Comprehensive metabolic panel   Bayer DCA Hb O2H Waived (Completed)   Microalbumin, Urine Waived (Completed)     Genitourinary   Benign hypertensive renal disease - Primary    BP quite low today. If still low next visit, will decrease her medication. CMP checked today. Continue to monitor. Microalbumin checked today.       Relevant Orders   Comprehensive metabolic panel   Microalbumin, Urine Waived (Completed)   Chronic kidney disease    Currently on a ARB. Checked CMP and microalbumin today. Continue to monitor.       Relevant Orders   Comprehensive metabolic panel   CBC With Differential/Platelet     Other   Anxiety    In exacerbation due to social issues today. Continue current regimen. Continue to monitor. Refill of her lorazepam given today.       Hyperlipidemia    Under great control at this time. Continue current medication. Refill of pravastatin given today. Continue to monitor.       Relevant Medications   atenolol (TENORMIN) 50 MG tablet   hydrochlorothiazide (HYDRODIURIL) 25 MG tablet   losartan (COZAAR) 50 MG tablet   pravastatin  (PRAVACHOL) 40 MG tablet   Other Relevant Orders   Lipid Panel Piccolo, Waived (Completed)    Other Visit Diagnoses    Glaucoma        Needs a referral back to opthalmology. Generated today.     Relevant Orders    Ambulatory referral to Ophthalmology    COPD exacerbation        In acute exacerbation at this time. Will treat with prednisone taper and z-pack. Following up with pulmonology next week. Call if not getting better.     Relevant Medications    buPROPion (ZYBAN) 150 MG 12 hr tablet    azithromycin (ZITHROMAX Z-PAK) 250 MG tablet    predniSONE (DELTASONE) 10 MG tablet        Follow up plan: Return 2-3 months.

## 2015-03-22 NOTE — Assessment & Plan Note (Signed)
BP quite low today. If still low next visit, will decrease her medication. CMP checked today. Continue to monitor. Microalbumin checked today.

## 2015-03-22 NOTE — Assessment & Plan Note (Signed)
A1c 6.3 today. Continue diet. Continue to monitor.

## 2015-03-22 NOTE — Assessment & Plan Note (Signed)
In exacerbation due to social issues today. Continue current regimen. Continue to monitor. Refill of her lorazepam given today.

## 2015-03-23 LAB — COMPREHENSIVE METABOLIC PANEL
ALT: 12 IU/L (ref 0–32)
AST: 17 IU/L (ref 0–40)
Albumin/Globulin Ratio: 1.5 (ref 1.1–2.5)
Albumin: 4.5 g/dL (ref 3.6–4.8)
Alkaline Phosphatase: 102 IU/L (ref 39–117)
BUN/Creatinine Ratio: 22 (ref 11–26)
BUN: 20 mg/dL (ref 8–27)
Bilirubin Total: <0.2 mg/dL (ref 0.0–1.2)
CO2: 31 mmol/L — ABNORMAL HIGH (ref 18–29)
Calcium: 9.6 mg/dL (ref 8.7–10.3)
Chloride: 94 mmol/L — ABNORMAL LOW (ref 97–108)
Creatinine, Ser: 0.93 mg/dL (ref 0.57–1.00)
GFR calc Af Amer: 76 mL/min/{1.73_m2} (ref >59)
GFR calc non Af Amer: 66 mL/min/{1.73_m2} (ref >59)
Globulin, Total: 3.1 g/dL (ref 1.5–4.5)
Glucose: 123 mg/dL — ABNORMAL HIGH (ref 65–99)
Potassium: 3.4 mmol/L — ABNORMAL LOW (ref 3.5–5.2)
Sodium: 143 mmol/L (ref 134–144)
Total Protein: 7.6 g/dL (ref 6.0–8.5)

## 2015-03-25 ENCOUNTER — Encounter: Payer: Self-pay | Admitting: Family Medicine

## 2015-03-26 ENCOUNTER — Telehealth: Payer: Self-pay | Admitting: Family Medicine

## 2015-03-26 NOTE — Telephone Encounter (Signed)
Called patient and let her know the results of her blood work. Also let her know that they have been mailed out to her as well.

## 2015-03-26 NOTE — Telephone Encounter (Signed)
Patient called requesting results. Were discussed at her appointment, but "just wants to know how she's doing". Requesting a call back.

## 2015-03-27 DIAGNOSIS — J449 Chronic obstructive pulmonary disease, unspecified: Secondary | ICD-10-CM | POA: Diagnosis not present

## 2015-03-27 DIAGNOSIS — J9801 Acute bronchospasm: Secondary | ICD-10-CM | POA: Diagnosis not present

## 2015-03-27 DIAGNOSIS — R05 Cough: Secondary | ICD-10-CM | POA: Diagnosis not present

## 2015-04-09 DIAGNOSIS — E119 Type 2 diabetes mellitus without complications: Secondary | ICD-10-CM | POA: Diagnosis not present

## 2015-04-09 LAB — HM DIABETES EYE EXAM

## 2015-04-10 DIAGNOSIS — J449 Chronic obstructive pulmonary disease, unspecified: Secondary | ICD-10-CM | POA: Diagnosis not present

## 2015-05-10 ENCOUNTER — Telehealth: Payer: Self-pay | Admitting: Family Medicine

## 2015-05-10 DIAGNOSIS — F419 Anxiety disorder, unspecified: Secondary | ICD-10-CM

## 2015-05-10 MED ORDER — GABAPENTIN 100 MG PO CAPS: 200.0000 mg | ORAL_CAPSULE | Freq: Three times a day (TID) | ORAL | Status: DC

## 2015-05-10 NOTE — Telephone Encounter (Signed)
Called into [facility].

## 2015-05-10 NOTE — Telephone Encounter (Signed)
Gabapentin sent in. Requip 6 month supply sent in at the end of June. Should not be due.

## 2015-05-10 NOTE — Telephone Encounter (Signed)
Patient has an appointment the middle of September.

## 2015-05-10 NOTE — Telephone Encounter (Signed)
E-fax came through for refill on: Rx: Ropinirole Rx: Gabapentin Copy in basket.

## 2015-05-11 DIAGNOSIS — J449 Chronic obstructive pulmonary disease, unspecified: Secondary | ICD-10-CM | POA: Diagnosis not present

## 2015-06-11 ENCOUNTER — Ambulatory Visit (INDEPENDENT_AMBULATORY_CARE_PROVIDER_SITE_OTHER): Payer: Medicare Other | Admitting: Family Medicine

## 2015-06-11 ENCOUNTER — Other Ambulatory Visit: Payer: Self-pay | Admitting: Family Medicine

## 2015-06-11 ENCOUNTER — Encounter: Payer: Self-pay | Admitting: Family Medicine

## 2015-06-11 VITALS — BP 130/67 | HR 103 | Temp 98.6°F | Wt 225.0 lb

## 2015-06-11 DIAGNOSIS — Z23 Encounter for immunization: Secondary | ICD-10-CM | POA: Diagnosis not present

## 2015-06-11 DIAGNOSIS — J449 Chronic obstructive pulmonary disease, unspecified: Secondary | ICD-10-CM | POA: Diagnosis not present

## 2015-06-11 DIAGNOSIS — E785 Hyperlipidemia, unspecified: Secondary | ICD-10-CM | POA: Diagnosis not present

## 2015-06-11 DIAGNOSIS — F419 Anxiety disorder, unspecified: Secondary | ICD-10-CM

## 2015-06-11 DIAGNOSIS — I129 Hypertensive chronic kidney disease with stage 1 through stage 4 chronic kidney disease, or unspecified chronic kidney disease: Secondary | ICD-10-CM | POA: Diagnosis not present

## 2015-06-11 DIAGNOSIS — G2581 Restless legs syndrome: Secondary | ICD-10-CM

## 2015-06-11 MED ORDER — ATENOLOL 50 MG PO TABS: 50.0000 mg | ORAL_TABLET | Freq: Every day | ORAL | Status: DC

## 2015-06-11 MED ORDER — GABAPENTIN 100 MG PO CAPS: 200.0000 mg | ORAL_CAPSULE | Freq: Three times a day (TID) | ORAL | Status: DC

## 2015-06-11 MED ORDER — HYDROCHLOROTHIAZIDE 25 MG PO TABS: 25.0000 mg | ORAL_TABLET | Freq: Every day | ORAL | Status: DC

## 2015-06-11 MED ORDER — ALBUTEROL SULFATE HFA 108 (90 BASE) MCG/ACT IN AERS: 2.0000 | INHALATION_SPRAY | Freq: Four times a day (QID) | RESPIRATORY_TRACT | Status: DC | PRN

## 2015-06-11 MED ORDER — CLONAZEPAM 1 MG PO TABS: 1.0000 mg | ORAL_TABLET | Freq: Every day | ORAL | Status: DC | PRN

## 2015-06-11 MED ORDER — ALBUTEROL SULFATE (2.5 MG/3ML) 0.083% IN NEBU: 2.5000 mg | INHALATION_SOLUTION | Freq: Four times a day (QID) | RESPIRATORY_TRACT | Status: DC | PRN

## 2015-06-11 MED ORDER — TIOTROPIUM BROMIDE MONOHYDRATE 18 MCG IN CAPS: 18.0000 ug | ORAL_CAPSULE | Freq: Every day | RESPIRATORY_TRACT | Status: DC

## 2015-06-11 MED ORDER — LOSARTAN POTASSIUM 50 MG PO TABS: 50.0000 mg | ORAL_TABLET | Freq: Every day | ORAL | Status: DC

## 2015-06-11 MED ORDER — PRAVASTATIN SODIUM 40 MG PO TABS
40.0000 mg | ORAL_TABLET | Freq: Every day | ORAL | Status: DC
Start: 2015-06-11 — End: 2016-01-21

## 2015-06-11 MED ORDER — BUDESONIDE-FORMOTEROL FUMARATE 160-4.5 MCG/ACT IN AERO: 2.0000 | INHALATION_SPRAY | Freq: Two times a day (BID) | RESPIRATORY_TRACT | Status: DC

## 2015-06-11 MED ORDER — THEOPHYLLINE ER 400 MG PO TB24: 400.0000 mg | ORAL_TABLET | Freq: Every day | ORAL | Status: DC

## 2015-06-11 MED ORDER — BUPROPION HCL ER (SMOKING DET) 150 MG PO TB12: 300.0000 mg | ORAL_TABLET | Freq: Every day | ORAL | Status: DC

## 2015-06-11 MED ORDER — ROPINIROLE HCL 0.25 MG PO TABS: 0.2500 mg | ORAL_TABLET | Freq: Three times a day (TID) | ORAL | Status: DC

## 2015-06-11 NOTE — Assessment & Plan Note (Signed)
Recovered from recent exacerbation. Continue oxygen. Continue current regimen. Encouraged her to establish with a new pulmonologist ASAP.  [place] of [place], we will give her 6 month supply until she can establish with a new provider.

## 2015-06-11 NOTE — Assessment & Plan Note (Signed)
Under good control at last check. As she is moving out of state, we will give her 6 month supply until she can establish with a new provider.

## 2015-06-11 NOTE — Assessment & Plan Note (Signed)
BP under good control at this time. As she is moving out of state, we will give her 6 month supply until she can establish with a new provider.

## 2015-06-11 NOTE — Assessment & Plan Note (Signed)
Mild- has usually taken it more for RLS than anxiety. Controlled with klonapin, which helps with the  RLS as well. Has been very stable. Has followed controlled substance agreement and used medicine appropriately.  As she is moving out of state, we will give her 6 month supply until she can establish with a new provider with 90 day supply followed by post-dated rx for December. After this, she will need to get her medicine from her new provider.

## 2015-06-11 NOTE — Progress Notes (Addendum)
BP 130/67 mmHg  Pulse 103  Temp(Src) 98.6 F (37 C)  Wt 225 lb (102.059 kg)  SpO2 97% on supplemental O2  Subjective:    Patient ID: [name], female    DOB: September 30, 1950, 64 y.o.   MRN: [id number]  HPI: [name] is a 64 y.o. female  Chief Complaint  Patient presents with  . Anxiety    Patient is moving, she will be in [place] for 3 months until she can get settled back in with her daughter. Is there anyway that she can get enough medication to get her through until then.,   Restless leg has been acting up again. Klonapin helps, but she has been having to take it just about every night. She has been feeling more anxious recently from social issues. He son-in-law lost his job and the family is going to have to move. She is going to go to [place] to live with her ex-husband and his wife until her daughter and her husband get settled in [place]. She is planning on getting settled with her new doctors shortly, but is concerned about how quickly she will be able to get set up. She just got over a COPD exacerbation and has finished her steroids and antibiotics. She notes that she is feeling better and more like herself. She is anxious about her move. She is otherwise doing well with no other concerns or complaints at this time.    ANXIETY/STRESS Duration:controlled Anxious mood: yes  Excessive worrying: yes Irritability: no  Sweating: no Nausea: no Palpitations:no Hyperventilation: no Panic attacks: no Agoraphobia: no  Obscessions/compulsions: no Depressed mood: no Depression screen PHQ 2/9 06/12/2015  Decreased Interest 1  Down, Depressed, Hopeless 1  PHQ - 2 Score 2  GAD7: 13 Anhedonia: no Weight changes: no Insomnia: no   Hypersomnia: no Fatigue/loss of energy: yes Feelings of worthlessness: no Feelings of guilt: no Impaired concentration/indecisiveness: yes Suicidal ideations: no  Crying spells: no Recent Stressors/Life Changes: yes  Relationship problems: no   Family stress: yes     Financial stress: yes    Job stress: no    Recent death/loss: no   Relevant past medical, surgical, family and social history reviewed and updated as indicated. Interim medical history since our last visit reviewed. Allergies and medications reviewed and updated.  Review of Systems  Constitutional: Negative.   Respiratory: Negative.   Cardiovascular: Negative.   Musculoskeletal: Positive for myalgias, arthralgias and gait problem. Negative for back pain, joint swelling, neck pain and neck stiffness.  Skin: Negative.   Psychiatric/Behavioral: Negative.     Per HPI unless specifically indicated above     Objective:    BP 130/67 mmHg  Pulse 103  Temp(Src) 98.6 F (37 C)  Wt 225 lb (102.059 kg)  SpO2 97%  Wt Readings from Last 3 Encounters:  06/11/15 225 lb (102.059 kg)  03/22/15 222 lb (100.699 kg)  11/20/14 223 lb (101.152 kg)    Physical Exam  Constitutional: She is oriented to person, place, and time. She appears well-developed and well-nourished. No distress.  HENT:  Head: Normocephalic and atraumatic.  Right Ear: Hearing normal.  Left Ear: Hearing normal.  Nose: Nose normal.  Eyes: Conjunctivae and lids are normal. Right eye exhibits no discharge. Left eye exhibits no discharge. No scleral icterus.  Cardiovascular: Normal rate, regular rhythm, normal heart sounds and intact distal pulses.  Exam reveals no gallop and no friction rub.   No murmur heard. Pulmonary/Chest:  Effort normal. No respiratory distress. She has wheezes. She has no rales. She exhibits no tenderness.  Decreased breath sounds throughout  Musculoskeletal: Normal range of motion.  Neurological: She is alert and oriented to person, place, and time.  Skin: Skin is warm, dry and intact. No rash noted. No erythema. No pallor.  Psychiatric: She has a normal mood and affect. Her speech is normal and behavior is normal. Judgment and thought content normal.  Cognition and memory are normal.  Nursing note and vitals reviewed.   Results for orders placed or performed in visit on 03/22/15  Comprehensive metabolic panel  Result Value Ref Range   Glucose 123 (H) 65 - 99 mg/dL   BUN 20 8 - 27 mg/dL   Creatinine, Ser 1.61 0.57 - 1.00 mg/dL   GFR calc non Af Amer 66 >59 mL/min/1.73   GFR calc Af Amer 76 >59 mL/min/1.73   BUN/Creatinine Ratio 22 11 - 26   Sodium 143 134 - 144 mmol/L   Potassium 3.4 (L) 3.5 - 5.2 mmol/L   Chloride 94 (L) 97 - 108 mmol/L   CO2 31 (H) 18 - 29 mmol/L   Calcium 9.6 8.7 - 10.3 mg/dL   Total Protein 7.6 6.0 - 8.5 g/dL   Albumin 4.5 3.6 - 4.8 g/dL   Globulin, Total 3.1 1.5 - 4.5 g/dL   Albumin/Globulin Ratio 1.5 1.1 - 2.5   Bilirubin Total <0.2 0.0 - 1.2 mg/dL   Alkaline Phosphatase 102 39 - 117 IU/L   AST 17 0 - 40 IU/L   ALT 12 0 - 32 IU/L  Bayer DCA Hb A1c Waived  Result Value Ref Range   Bayer DCA Hb A1c Waived 6.3 <7.0 %  Lipid Panel Piccolo, Waived  Result Value Ref Range   Cholesterol Piccolo, Waived 158 <200 mg/dL   HDL Chol Piccolo, Waived 57 (L) >59 mg/dL   Triglycerides Piccolo,Waived 176 (H) <150 mg/dL   Chol/HDL Ratio Piccolo,Waive 2.8 mg/dL   LDL Chol Calc Piccolo Waived 66 <100 mg/dL   VLDL Chol Calc Piccolo,Waive 35 (H) <30 mg/dL  Microalbumin, Urine Waived  Result Value Ref Range   Microalb, Ur Waived 30 (H) 0 - 19 mg/L   Creatinine, Urine Waived 100 10 - 300 mg/dL   Microalb/Creat Ratio <30 <30 mg/g  CBC With Differential/Platelet  Result Value Ref Range   WBC 15.3 (H) 3.4 - 10.8 x10E3/uL   RBC 4.10 3.77 - 5.28 x10E6/uL   Hemoglobin 12.4 11.1 - 15.9 g/dL   Hematocrit 09.6 04.5 - 46.6 %   MCV 95 79 - 97 fL   MCH 30.2 26.6 - 33.0 pg   MCHC 31.8 31.5 - 35.7 g/dL   RDW 40.9 81.1 - 91.4 %   Platelets 284 150 - 379 x10E3/uL   Neutrophils 74 %   Lymphs 20 %   MID 7 %   Neutrophils Absolute 11.3 (H) 1.4 - 7.0 x10E3/uL   Lymphocytes Absolute 3.0 0.7 - 3.1 x10E3/uL   MID (Absolute) 1.0  0.1 - 1.6 X10E3/uL      Assessment & Plan:   Problem List Items Addressed This Visit      Respiratory   COPD (chronic obstructive pulmonary disease)    Recovered from recent exacerbation. Continue oxygen. Continue current regimen. Encouraged her to establish with a new pulmonologist ASAP.  As she is moving out of state, we will give her 6 month supply until she can establish with a new provider.      Relevant Medications  albuterol (PROVENTIL HFA;VENTOLIN HFA) 108 (90 BASE) MCG/ACT inhaler   albuterol (PROVENTIL) (2.5 MG/3ML) 0.083% nebulizer solution   budesonide-formoterol (SYMBICORT) 160-4.5 MCG/ACT inhaler   buPROPion (ZYBAN) 150 MG 12 hr tablet   theophylline (UNIPHYL) 400 MG 24 hr tablet   tiotropium (SPIRIVA) 18 MCG inhalation capsule     Genitourinary   Benign hypertensive renal disease    BP under good control at this time. As she is moving out of state, we will give her 6 month supply until she can establish with a new provider.       Relevant Medications   atenolol (TENORMIN) 50 MG tablet   hydrochlorothiazide (HYDRODIURIL) 25 MG tablet   losartan (COZAAR) 50 MG tablet     Other   Anxiety - Primary    Mild- has usually taken it more for RLS than anxiety. Controlled with klonapin, which helps with the  RLS as well. Has been very stable. Has followed controlled substance agreement and used medicine appropriately.  As she is moving out of state, we will give her 6 month supply until she can establish with a new provider with 90 day supply followed by post-dated rx for December. After this, she will need to get her medicine from her new provider.       Relevant Medications   buPROPion (ZYBAN) 150 MG 12 hr tablet   gabapentin (NEURONTIN) 100 MG capsule   rOPINIRole (REQUIP) 0.25 MG tablet   clonazePAM (KLONOPIN) 1 MG tablet   Hyperlipidemia    Under good control at last check. As she is moving out of state, we will give her 6 month supply until she can establish with a  new provider.      Relevant Medications   atenolol (TENORMIN) 50 MG tablet   hydrochlorothiazide (HYDRODIURIL) 25 MG tablet   losartan (COZAAR) 50 MG tablet   pravastatin (PRAVACHOL) 40 MG tablet   Restless legs syndrome (RLS)    Not under great control at this time. Controlled with klonapin, which helps with the anxiety as well. Has been very stable. Has followed controlled substance agreement and used medicine appropriately.  As she is moving out of state, we will give her 6 month supply until she can establish with a new provider with 90 day supply followed by post-dated rx for December. After this, she will need to get her medicine from her new provider.        Other Visit Diagnoses    Immunization due        Flu shot given today.     Relevant Orders    Flu Vaccine QUAD 36+ mos PF IM (Fluarix & Fluzone Quad PF) (Completed)        Follow up plan: Return if symptoms worsen or fail to improve.

## 2015-06-11 NOTE — Assessment & Plan Note (Signed)
Not under great control at this time. Controlled with klonapin, which helps with the anxiety as well. Has been very stable. Has followed controlled substance agreement and used medicine appropriately.  As she is moving out of state, we will give her 6 month supply until she can establish with a new provider with 90 day supply followed by post-dated rx for December. After this, she will need to get her medicine from her new provider.

## 2015-06-14 ENCOUNTER — Ambulatory Visit: Payer: Self-pay | Admitting: Family Medicine

## 2015-08-05 ENCOUNTER — Telehealth: Payer: Self-pay | Admitting: Family Medicine

## 2015-08-05 NOTE — Telephone Encounter (Signed)
Pt needs refills sent to [facility]. Would not give me the list she just continued to say it's a lot of them. Pt stated she had some questions about some of her medications and would like a call back.

## 2015-08-05 NOTE — Telephone Encounter (Signed)
Spoke with patient and explained that all the medications [facility] [place] [facility]/[place], notified patient to call and speak with someone at the pharmacy to see if they have them on hold. If they don't have them on hold to give me a call back.

## 2015-08-11 DIAGNOSIS — J449 Chronic obstructive pulmonary disease, unspecified: Secondary | ICD-10-CM | POA: Diagnosis not present

## 2015-09-09 ENCOUNTER — Telehealth: Payer: Self-pay | Admitting: Family Medicine

## 2015-09-09 NOTE — Telephone Encounter (Signed)
[facility]arm correction:  [address], [place], [place] [phone]

## 2015-09-09 NOTE — Telephone Encounter (Signed)
Pt called she is in [place], pt stated the RX for Klonopin must be electronically sent, pt also needs refill on HCTZ. Pharm is [facility] on [address] [place], [place]. Pt needs a 3 month supply. Thanks.  Pharm phone # [phone]

## 2015-09-09 NOTE — Telephone Encounter (Signed)
Please contact patient and let her know that Dr.Johnson can not send medications [place]. She was also aware that she needed to find a new provider in [place].

## 2015-09-11 ENCOUNTER — Telehealth: Payer: Self-pay

## 2015-09-11 DIAGNOSIS — I129 Hypertensive chronic kidney disease with stage 1 through stage 4 chronic kidney disease, or unspecified chronic kidney disease: Secondary | ICD-10-CM

## 2015-09-11 DIAGNOSIS — F419 Anxiety disorder, unspecified: Secondary | ICD-10-CM

## 2015-09-11 NOTE — Telephone Encounter (Signed)
Notified patient that we are unable to send th prescription for the klonopin. Will send HCTZ.

## 2015-09-11 NOTE — Telephone Encounter (Signed)
Patient called, she states that the pharmacy there will not accept the script that you had wrote for her back in September for the Klonipin and HCTZ!  Pharmacy states that it can be electronically sent. I informed patient that I didn't think that you could refill it, I told her I would ask you again.

## 2015-09-11 NOTE — Telephone Encounter (Signed)
We can't send it in because of [facility]. We can call in the Rx, but then she will need to get a new provider for the next rx.

## 2015-09-11 NOTE — Telephone Encounter (Signed)
I will send it, I just need the pharmacy in [place]'s info

## 2015-09-11 NOTE — Telephone Encounter (Signed)
I am not able to call the prescription in due to a strict policy that they have. Could we mail her a new copy of the prescription, in [place] it has to be filled within 30 days of the prescription being written.

## 2015-09-12 MED ORDER — HYDROCHLOROTHIAZIDE 25 MG PO TABS: 25.0000 mg | ORAL_TABLET | Freq: Every day | ORAL | Status: DC

## 2015-09-12 NOTE — Addendum Note (Signed)
Addended by: Dorcas CarrowJOHNSON, MEGAN P on: 09/12/2015 02:46 PM   Modules accepted: Orders

## 2015-09-12 NOTE — Telephone Encounter (Signed)
Rx sent to her pharmacy 

## 2015-09-15 DIAGNOSIS — J441 Chronic obstructive pulmonary disease with (acute) exacerbation: Secondary | ICD-10-CM | POA: Diagnosis not present

## 2015-09-15 DIAGNOSIS — R0600 Dyspnea, unspecified: Secondary | ICD-10-CM | POA: Diagnosis not present

## 2015-09-15 DIAGNOSIS — F17200 Nicotine dependence, unspecified, uncomplicated: Secondary | ICD-10-CM | POA: Diagnosis not present

## 2015-09-15 DIAGNOSIS — I10 Essential (primary) hypertension: Secondary | ICD-10-CM | POA: Diagnosis not present

## 2015-09-15 DIAGNOSIS — E119 Type 2 diabetes mellitus without complications: Secondary | ICD-10-CM | POA: Diagnosis not present

## 2015-10-22 ENCOUNTER — Encounter: Payer: Self-pay | Admitting: Family Medicine

## 2015-10-22 ENCOUNTER — Ambulatory Visit (INDEPENDENT_AMBULATORY_CARE_PROVIDER_SITE_OTHER): Payer: Medicare Other | Admitting: Family Medicine

## 2015-10-22 VITALS — BP 108/63 | HR 96 | Temp 98.7°F | Ht 63.7 in | Wt 219.0 lb

## 2015-10-22 DIAGNOSIS — J449 Chronic obstructive pulmonary disease, unspecified: Secondary | ICD-10-CM | POA: Diagnosis not present

## 2015-10-22 DIAGNOSIS — G2581 Restless legs syndrome: Secondary | ICD-10-CM | POA: Diagnosis not present

## 2015-10-22 DIAGNOSIS — R7301 Impaired fasting glucose: Secondary | ICD-10-CM | POA: Diagnosis not present

## 2015-10-22 DIAGNOSIS — N182 Chronic kidney disease, stage 2 (mild): Secondary | ICD-10-CM

## 2015-10-22 DIAGNOSIS — F419 Anxiety disorder, unspecified: Secondary | ICD-10-CM

## 2015-10-22 DIAGNOSIS — E119 Type 2 diabetes mellitus without complications: Secondary | ICD-10-CM

## 2015-10-22 DIAGNOSIS — I129 Hypertensive chronic kidney disease with stage 1 through stage 4 chronic kidney disease, or unspecified chronic kidney disease: Secondary | ICD-10-CM | POA: Diagnosis not present

## 2015-10-22 DIAGNOSIS — E785 Hyperlipidemia, unspecified: Secondary | ICD-10-CM | POA: Diagnosis not present

## 2015-10-22 LAB — CBC WITH DIFFERENTIAL/PLATELET
Hematocrit: 34.3 % (ref 34.0–46.6)
Hemoglobin: 11.1 g/dL (ref 11.1–15.9)
Lymphocytes Absolute: 2.3 10*3/uL (ref 0.7–3.1)
Lymphs: 16 %
MCH: 31.4 pg (ref 26.6–33.0)
MCHC: 32.4 g/dL (ref 31.5–35.7)
MCV: 97 fL (ref 79–97)
MID (Absolute): 0.7 10*3/uL (ref 0.1–1.6)
MID: 5 %
Neutrophils Absolute: 11.8 10*3/uL — ABNORMAL HIGH (ref 1.4–7.0)
Neutrophils: 80 %
Platelets: 266 10*3/uL (ref 150–379)
RBC: 3.53 x10E6/uL — ABNORMAL LOW (ref 3.77–5.28)
RDW: 13.9 % (ref 12.3–15.4)
WBC: 14.8 10*3/uL — ABNORMAL HIGH (ref 3.4–10.8)

## 2015-10-22 LAB — BAYER DCA HB A1C WAIVED: HB A1C (BAYER DCA - WAIVED): 6.6 % (ref <7.0)

## 2015-10-22 MED ORDER — SERTRALINE HCL 25 MG PO TABS: 25.0000 mg | ORAL_TABLET | Freq: Every day | ORAL | Status: DC

## 2015-10-22 MED ORDER — LOSARTAN POTASSIUM 50 MG PO TABS: 50.0000 mg | ORAL_TABLET | Freq: Every day | ORAL | Status: DC

## 2015-10-22 NOTE — Assessment & Plan Note (Signed)
Stable on current regimen. Continue current regimen. Continue to montior

## 2015-10-22 NOTE — Assessment & Plan Note (Signed)
Stable on current regimen. Continue current regimen. Continue to montior 

## 2015-10-22 NOTE — Assessment & Plan Note (Signed)
Newly diagnosed. A1c 6.6 up from 6.3- work on diet and exercise. Will recheck in 3 months.

## 2015-10-22 NOTE — Assessment & Plan Note (Signed)
Stable on current medicines. Continue current regimen. Continue to monitor.

## 2015-10-22 NOTE — Progress Notes (Signed)
BP 108/63 mmHg  Pulse 96  Temp(Src) 98.7 F (37.1 C)  Ht 5' 3.7" (1.618 m)  Wt 219 lb (99.338 kg)  BMI 37.95 kg/m2  SpO2 91%   Subjective:    Patient ID: [name], female    DOB: 12-31-1950, 65 y.o.   MRN: [id number]  HPI: [name] is a 65 y.o. female  Chief Complaint  Patient presents with  . medication refills  . Stress   Just moved back from [place]. States that she is practically homeless, but living with a couple of ladies for as long as she can. She states that her daughter kicked her out and that she can't live with her any more. She is really upset about this because she has been under a lot of stress  Impaired Fasting Glucose HbA1C: 6.6 Duration of elevated blood sugar: chronic Polydipsia: yes Polyuria: no Weight change: no Visual disturbance: yes Glucose Monitoring: yes- every other day Diabetic Education: Not Completed Family history of diabetes: yes  HYPERTENSION / HYPERLIPIDEMIA Satisfied with current treatment? yes Duration of hypertension: chronic BP monitoring frequency: not checking BP medication side effects: no Duration of hyperlipidemia: chronic Cholesterol medication side effects: no Cholesterol supplements: none Medication compliance: good compliance Aspirin: yes Recent stressors: yes Recurrent headaches: no Visual changes: no Palpitations: no Dyspnea: no Chest pain: no Lower extremity edema: no Dizzy/lightheaded: no  ANXIETY/STRESS- under a lot of stress, very sad. Not living with daughter and granddaughter any more. Feeling abandoned. Stopped her wellbutrin. Doesn't want to go back on it. Would consider taking something else for mood.  Duration:exacerbated Anxious mood: yes  Excessive worrying: yes Irritability: yes  Sweating: no Nausea: no Palpitations:no Hyperventilation: no Panic attacks: no Agoraphobia: no  Obscessions/compulsions: no Depressed mood: yes Depression screen PHQ 2/9 06/12/2015  Decreased Interest  1  Down, Depressed, Hopeless 1  PHQ - 2 Score 2   Anhedonia: no Weight changes: no Insomnia: no   Hypersomnia: no Fatigue/loss of energy: yes Feelings of worthlessness: no Feelings of guilt: yes Impaired concentration/indecisiveness: yes Suicidal ideations: no  Crying spells: yes Recent Stressors/Life Changes: yes   Relationship problems: no   Family stress: yes     Financial stress: yes    Job stress: no    Recent death/loss: no   Relevant past medical, surgical, family and social history reviewed and updated as indicated. Interim medical history since our last visit reviewed. Allergies and medications reviewed and updated.  Review of Systems  Constitutional: Negative.   Respiratory: Negative.   Cardiovascular: Negative.   Gastrointestinal: Negative.   Musculoskeletal: Negative.   Psychiatric/Behavioral: Negative.     Per HPI unless specifically indicated above     Objective:    BP 108/63 mmHg  Pulse 96  Temp(Src) 98.7 F (37.1 C)  Ht 5' 3.7" (1.618 m)  Wt 219 lb (99.338 kg)  BMI 37.95 kg/m2  SpO2 91%  Wt Readings from Last 3 Encounters:  10/22/15 219 lb (99.338 kg)  06/11/15 225 lb (102.059 kg)  03/22/15 222 lb (100.699 kg)    Physical Exam  Constitutional: She is oriented to person, place, and time. She appears well-developed and well-nourished. No distress.  HENT:  Head: Normocephalic and atraumatic.  Right Ear: Hearing and external ear normal.  Left Ear: Hearing and external ear normal.  Nose: Nose normal.  Mouth/Throat: Oropharynx is clear and moist. No oropharyngeal exudate.  Eyes: Conjunctivae, EOM and lids are normal. Pupils are equal, round, and reactive to light. Right  eye exhibits no discharge. Left eye exhibits no discharge. No scleral icterus.  Neck: Normal range of motion. Neck supple. No JVD present. No tracheal deviation present. No thyromegaly present.  Cardiovascular: Normal rate, regular rhythm, normal heart sounds and intact distal  pulses.  Exam reveals no gallop and no friction rub.   No murmur heard. Pulmonary/Chest: Effort normal. No stridor. No respiratory distress. She has decreased breath sounds. She has no wheezes. She has no rales. She exhibits no tenderness.  Musculoskeletal: Normal range of motion.  Lymphadenopathy:    She has no cervical adenopathy.  Neurological: She is alert and oriented to person, place, and time.  Skin: Skin is warm, dry and intact. No rash noted. She is not diaphoretic. No erythema. No pallor.  Psychiatric: She has a normal mood and affect. Her speech is normal and behavior is normal. Judgment and thought content normal. Cognition and memory are normal.  Nursing note and vitals reviewed.   Results for orders placed or performed in visit on 03/22/15  Comprehensive metabolic panel  Result Value Ref Range   Glucose 123 (H) 65 - 99 mg/dL   BUN 20 8 - 27 mg/dL   Creatinine, Ser 1.61 0.57 - 1.00 mg/dL   GFR calc non Af Amer 66 >59 mL/min/1.73   GFR calc Af Amer 76 >59 mL/min/1.73   BUN/Creatinine Ratio 22 11 - 26   Sodium 143 134 - 144 mmol/L   Potassium 3.4 (L) 3.5 - 5.2 mmol/L   Chloride 94 (L) 97 - 108 mmol/L   CO2 31 (H) 18 - 29 mmol/L   Calcium 9.6 8.7 - 10.3 mg/dL   Total Protein 7.6 6.0 - 8.5 g/dL   Albumin 4.5 3.6 - 4.8 g/dL   Globulin, Total 3.1 1.5 - 4.5 g/dL   Albumin/Globulin Ratio 1.5 1.1 - 2.5   Bilirubin Total <0.2 0.0 - 1.2 mg/dL   Alkaline Phosphatase 102 39 - 117 IU/L   AST 17 0 - 40 IU/L   ALT 12 0 - 32 IU/L  Bayer DCA Hb A1c Waived  Result Value Ref Range   Bayer DCA Hb A1c Waived 6.3 <7.0 %  Lipid Panel Piccolo, Waived  Result Value Ref Range   Cholesterol Piccolo, Waived 158 <200 mg/dL   HDL Chol Piccolo, Waived 57 (L) >59 mg/dL   Triglycerides Piccolo,Waived 176 (H) <150 mg/dL   Chol/HDL Ratio Piccolo,Waive 2.8 mg/dL   LDL Chol Calc Piccolo Waived 66 <100 mg/dL   VLDL Chol Calc Piccolo,Waive 35 (H) <30 mg/dL  Microalbumin, Urine Waived  Result Value  Ref Range   Microalb, Ur Waived 30 (H) 0 - 19 mg/L   Creatinine, Urine Waived 100 10 - 300 mg/dL   Microalb/Creat Ratio <30 <30 mg/g  CBC With Differential/Platelet  Result Value Ref Range   WBC 15.3 (H) 3.4 - 10.8 x10E3/uL   RBC 4.10 3.77 - 5.28 x10E6/uL   Hemoglobin 12.4 11.1 - 15.9 g/dL   Hematocrit 09.6 04.5 - 46.6 %   MCV 95 79 - 97 fL   MCH 30.2 26.6 - 33.0 pg   MCHC 31.8 31.5 - 35.7 g/dL   RDW 40.9 81.1 - 91.4 %   Platelets 284 150 - 379 x10E3/uL   Neutrophils 74 %   Lymphs 20 %   MID 7 %   Neutrophils Absolute 11.3 (H) 1.4 - 7.0 x10E3/uL   Lymphocytes Absolute 3.0 0.7 - 3.1 x10E3/uL   MID (Absolute) 1.0 0.1 - 1.6 X10E3/uL  Assessment & Plan:   Problem List Items Addressed This Visit      Endocrine   Type 2 diabetes, diet controlled (HCC)    Newly diagnosed. A1c 6.6 up from 6.3- work on diet and exercise. Will recheck in 3 months.       Relevant Medications   losartan (COZAAR) 50 MG tablet     Genitourinary   Benign hypertensive renal disease    Stable on current regimen. Continue current regimen. Continue to montior      Relevant Medications   losartan (COZAAR) 50 MG tablet   Other Relevant Orders   CBC With Differential/Platelet   Chronic kidney disease    Stable on current regimen. Continue current regimen. Continue to montior      Relevant Orders   CBC With Differential/Platelet   Comprehensive metabolic panel     Other   Anxiety - Primary    In exacerbation today due to recent social issues. Will start low dose zoloft to see if that helps. Continue to monitor closely and recheck in 1 month.       Relevant Medications   sertraline (ZOLOFT) 25 MG tablet   Other Relevant Orders   TSH   Hyperlipidemia    Stable on current regimen. Continue current regimen. Continue to montior      Relevant Medications   losartan (COZAAR) 50 MG tablet   Other Relevant Orders   Comprehensive metabolic panel   Lipid Panel w/o Chol/HDL Ratio   Restless  legs syndrome (RLS)    Stable on current regimen. Continue current regimen. Continue to montior      Relevant Orders   Comprehensive metabolic panel   TSH    Other Visit Diagnoses    Chronic obstructive pulmonary disease, unspecified COPD, unspecified chronic bronchitis type        Relevant Orders    CBC With Differential/Platelet    TSH        Follow up plan: Return in about 3 months (around 01/20/2016) for DM visit.

## 2015-10-22 NOTE — Assessment & Plan Note (Signed)
In exacerbation today due to recent social issues. Will start low dose zoloft to see if that helps. Continue to monitor closely and recheck in 1 month.

## 2015-10-23 ENCOUNTER — Encounter: Payer: Self-pay | Admitting: Family Medicine

## 2015-10-23 LAB — COMPREHENSIVE METABOLIC PANEL
ALT: 13 IU/L (ref 0–32)
AST: 16 IU/L (ref 0–40)
Albumin/Globulin Ratio: 1.6 (ref 1.1–2.5)
Albumin: 4 g/dL (ref 3.6–4.8)
Alkaline Phosphatase: 84 IU/L (ref 39–117)
BUN/Creatinine Ratio: 23 (ref 11–26)
BUN: 27 mg/dL (ref 8–27)
Bilirubin Total: <0.2 mg/dL (ref 0.0–1.2)
CO2: 30 mmol/L — ABNORMAL HIGH (ref 18–29)
Calcium: 9 mg/dL (ref 8.7–10.3)
Chloride: 93 mmol/L — ABNORMAL LOW (ref 96–106)
Creatinine, Ser: 1.16 mg/dL — ABNORMAL HIGH (ref 0.57–1.00)
GFR calc Af Amer: 58 mL/min/{1.73_m2} — ABNORMAL LOW (ref >59)
GFR calc non Af Amer: 50 mL/min/{1.73_m2} — ABNORMAL LOW (ref >59)
Globulin, Total: 2.5 g/dL (ref 1.5–4.5)
Glucose: 110 mg/dL — ABNORMAL HIGH (ref 65–99)
Potassium: 3.6 mmol/L (ref 3.5–5.2)
Sodium: 141 mmol/L (ref 134–144)
Total Protein: 6.5 g/dL (ref 6.0–8.5)

## 2015-10-23 LAB — LIPID PANEL W/O CHOL/HDL RATIO
Cholesterol, Total: 160 mg/dL (ref 100–199)
HDL: 53 mg/dL (ref >39)
LDL Calculated: 80 mg/dL (ref 0–99)
Triglycerides: 133 mg/dL (ref 0–149)
VLDL Cholesterol Cal: 27 mg/dL (ref 5–40)

## 2015-10-23 LAB — TSH: TSH: 1.62 u[IU]/mL (ref 0.450–4.500)

## 2015-11-22 ENCOUNTER — Ambulatory Visit (INDEPENDENT_AMBULATORY_CARE_PROVIDER_SITE_OTHER): Payer: Medicare Other | Admitting: Family Medicine

## 2015-11-22 ENCOUNTER — Encounter: Payer: Self-pay | Admitting: Family Medicine

## 2015-11-22 VITALS — BP 99/65 | HR 79 | Temp 98.2°F | Ht 63.5 in | Wt 216.0 lb

## 2015-11-22 DIAGNOSIS — I129 Hypertensive chronic kidney disease with stage 1 through stage 4 chronic kidney disease, or unspecified chronic kidney disease: Secondary | ICD-10-CM | POA: Diagnosis not present

## 2015-11-22 DIAGNOSIS — F419 Anxiety disorder, unspecified: Secondary | ICD-10-CM | POA: Diagnosis not present

## 2015-11-22 DIAGNOSIS — Z720 Tobacco use: Secondary | ICD-10-CM | POA: Diagnosis not present

## 2015-11-22 MED ORDER — VARENICLINE TARTRATE 1 MG PO TABS
1.0000 mg | ORAL_TABLET | Freq: Two times a day (BID) | ORAL | Status: DC
Start: 2015-11-22 — End: 2015-12-24

## 2015-11-22 MED ORDER — VARENICLINE TARTRATE 0.5 MG X 11 & 1 MG X 42 PO MISC: ORAL | Status: DC

## 2015-11-22 MED ORDER — SERTRALINE HCL 50 MG PO TABS: 25.0000 mg | ORAL_TABLET | Freq: Every day | ORAL | Status: DC

## 2015-11-22 NOTE — Assessment & Plan Note (Signed)
Would like to start chantix. Rx given today. Recheck 1 month.

## 2015-11-22 NOTE — Assessment & Plan Note (Signed)
BP running low, will stop HCTZ and recheck in 1 month.

## 2015-11-22 NOTE — Progress Notes (Signed)
BP 99/65 mmHg  Pulse 79  Temp(Src) 98.2 F (36.8 C)  Ht 5' 3.5" (1.613 m)  Wt 216 lb (97.977 kg)  BMI 37.66 kg/m2  SpO2 95%   Subjective:    Patient ID: [name], female    DOB: Jul 27, 1951, 65 y.o.   MRN: [id number]  HPI: [name] is a 65 y.o. female  Chief Complaint  Patient presents with  . Anxiety  . would like to quit smoking   ANXIETY/STRESS Duration:better Anxious mood: yes  Excessive worrying: yes Irritability: no  Sweating: no Nausea: no Palpitations:no Hyperventilation: no Panic attacks: no Agoraphobia: no  Obscessions/compulsions: no Depressed mood: yes Depression screen [facility] 2/9 11/22/2015 06/12/2015  Decreased Interest 1 1  Down, Depressed, Hopeless 1 1  PHQ - 2 Score 2 2   Anhedonia: no Weight changes: yes Insomnia: no   Hypersomnia: no Fatigue/loss of energy: yes Feelings of worthlessness: yes Feelings of guilt: yes Impaired concentration/indecisiveness: no Suicidal ideations: no  Crying spells: no Recent Stressors/Life Changes: yes  SMOKING CESSATION Smoking Status: Current every day smoker Smoking Amount: 1pack per 3-4 days Smoking Quit Date: not set yet Smoking triggers: eating, stress Type of tobacco use: cigarettes Children in the house: yes Other household members who smoke: yes Treatments attempted: chantix worked in the past  Pneumovax: up to date  Relevant past medical, surgical, family and social history reviewed and updated as indicated. Interim medical history since our last visit reviewed. Allergies and medications reviewed and updated.  Review of Systems  Constitutional: Negative.   Respiratory: Negative.   Cardiovascular: Negative.   Psychiatric/Behavioral: Negative.     Per HPI unless specifically indicated above     Objective:    BP 99/65 mmHg  Pulse 79  Temp(Src) 98.2 F (36.8 C)  Ht 5' 3.5" (1.613 m)  Wt 216 lb (97.977 kg)  BMI 37.66 kg/m2  SpO2 95%  Wt Readings from Last 3 Encounters:   11/22/15 216 lb (97.977 kg)  10/22/15 219 lb (99.338 kg)  06/11/15 225 lb (102.059 kg)    Physical Exam  Constitutional: She is oriented to person, place, and time. She appears well-developed and well-nourished. No distress.  HENT:  Head: Normocephalic and atraumatic.  Right Ear: Hearing normal.  Left Ear: Hearing normal.  Nose: Nose normal.  Eyes: Conjunctivae and lids are normal. Right eye exhibits no discharge. Left eye exhibits no discharge. No scleral icterus.  Cardiovascular: Normal rate, regular rhythm and intact distal pulses.  Exam reveals no gallop and no friction rub.   No murmur heard. Pulmonary/Chest: Effort normal and breath sounds normal. No respiratory distress. She has no wheezes. She has no rales. She exhibits no tenderness.  Musculoskeletal: Normal range of motion.  Neurological: She is alert and oriented to person, place, and time.  Skin: Skin is warm, dry and intact. No rash noted. No erythema. No pallor.  Psychiatric: She has a normal mood and affect. Her speech is normal and behavior is normal. Judgment and thought content normal. Cognition and memory are normal.  Nursing note and vitals reviewed.   Results for orders placed or performed in visit on 10/22/15  Bayer DCA Hb A1c Waived  Result Value Ref Range   Bayer DCA Hb A1c Waived 6.6 <7.0 %  CBC With Differential/Platelet  Result Value Ref Range   WBC 14.8 (H) 3.4 - 10.8 x10E3/uL   RBC 3.53 (L) 3.77 - 5.28 x10E6/uL   Hemoglobin 11.1 11.1 - 15.9 g/dL   Hematocrit 34.3  34.0 - 46.6 %   MCV 97 79 - 97 fL   MCH 31.4 26.6 - 33.0 pg   MCHC 32.4 31.5 - 35.7 g/dL   RDW 16.1 09.6 - 04.5 %   Platelets 266 150 - 379 x10E3/uL   Neutrophils 80 %   Lymphs 16 %   MID 5 %   Neutrophils Absolute 11.8 (H) 1.4 - 7.0 x10E3/uL   Lymphocytes Absolute 2.3 0.7 - 3.1 x10E3/uL   MID (Absolute) 0.7 0.1 - 1.6 X10E3/uL  Comprehensive metabolic panel  Result Value Ref Range   Glucose 110 (H) 65 - 99 mg/dL   BUN 27 8 - 27  mg/dL   Creatinine, Ser 4.09 (H) 0.57 - 1.00 mg/dL   GFR calc non Af Amer 50 (L) >59 mL/min/1.73   GFR calc Af Amer 58 (L) >59 mL/min/1.73   BUN/Creatinine Ratio 23 11 - 26   Sodium 141 134 - 144 mmol/L   Potassium 3.6 3.5 - 5.2 mmol/L   Chloride 93 (L) 96 - 106 mmol/L   CO2 30 (H) 18 - 29 mmol/L   Calcium 9.0 8.7 - 10.3 mg/dL   Total Protein 6.5 6.0 - 8.5 g/dL   Albumin 4.0 3.6 - 4.8 g/dL   Globulin, Total 2.5 1.5 - 4.5 g/dL   Albumin/Globulin Ratio 1.6 1.1 - 2.5   Bilirubin Total <0.2 0.0 - 1.2 mg/dL   Alkaline Phosphatase 84 39 - 117 IU/L   AST 16 0 - 40 IU/L   ALT 13 0 - 32 IU/L  Lipid Panel w/o Chol/HDL Ratio  Result Value Ref Range   Cholesterol, Total 160 100 - 199 mg/dL   Triglycerides 811 0 - 149 mg/dL   HDL 53 >91 mg/dL   VLDL Cholesterol Cal 27 5 - 40 mg/dL   LDL Calculated 80 0 - 99 mg/dL  TSH  Result Value Ref Range   TSH 1.620 0.450 - 4.500 uIU/mL      Assessment & Plan:   Problem List Items Addressed This Visit      Genitourinary   Benign hypertensive renal disease - Primary    BP running low, will stop HCTZ and recheck in 1 month.         Other   Anxiety    Will increase to  and check in in 1 month.       Relevant Medications   sertraline (ZOLOFT) 50 MG tablet   Tobacco abuse    Would like to start chantix. Rx given today. Recheck 1 month.       Relevant Medications   varenicline (CHANTIX STARTING MONTH PAK) 0.5 MG X 11 & 1 MG X 42 tablet   varenicline (CHANTIX CONTINUING MONTH PAK) 1 MG tablet       Follow up plan: Return in about 4 weeks (around 12/20/2015) for smoking and mood follow up.

## 2015-11-22 NOTE — Assessment & Plan Note (Signed)
Will increase to  and check in in 1 month.

## 2015-12-12 ENCOUNTER — Other Ambulatory Visit: Payer: Self-pay | Admitting: Family Medicine

## 2015-12-20 ENCOUNTER — Ambulatory Visit: Payer: Medicare Other | Admitting: Family Medicine

## 2015-12-24 ENCOUNTER — Encounter: Payer: Self-pay | Admitting: Family Medicine

## 2015-12-24 ENCOUNTER — Ambulatory Visit (INDEPENDENT_AMBULATORY_CARE_PROVIDER_SITE_OTHER): Payer: Medicare Other | Admitting: Family Medicine

## 2015-12-24 VITALS — BP 112/76 | HR 78 | Temp 98.8°F | Ht 63.4 in | Wt 211.0 lb

## 2015-12-24 DIAGNOSIS — J449 Chronic obstructive pulmonary disease, unspecified: Secondary | ICD-10-CM | POA: Diagnosis not present

## 2015-12-24 DIAGNOSIS — F419 Anxiety disorder, unspecified: Secondary | ICD-10-CM | POA: Diagnosis not present

## 2015-12-24 DIAGNOSIS — I129 Hypertensive chronic kidney disease with stage 1 through stage 4 chronic kidney disease, or unspecified chronic kidney disease: Secondary | ICD-10-CM

## 2015-12-24 MED ORDER — BUDESONIDE-FORMOTEROL FUMARATE 160-4.5 MCG/ACT IN AERO: 2.0000 | INHALATION_SPRAY | Freq: Two times a day (BID) | RESPIRATORY_TRACT | Status: DC

## 2015-12-24 MED ORDER — SERTRALINE HCL 100 MG PO TABS: 100.0000 mg | ORAL_TABLET | Freq: Every day | ORAL | Status: DC

## 2015-12-24 MED ORDER — ATENOLOL 50 MG PO TABS: 50.0000 mg | ORAL_TABLET | Freq: Every day | ORAL | Status: DC

## 2015-12-24 MED ORDER — HYDROCHLOROTHIAZIDE 25 MG PO TABS: 12.5000 mg | ORAL_TABLET | Freq: Every day | ORAL | Status: DC

## 2015-12-24 NOTE — Assessment & Plan Note (Signed)
Continue oxygen. Continue to monitor. Refills given today.

## 2015-12-24 NOTE — Assessment & Plan Note (Signed)
Not under great control. We will increase to 100mg  daily and recheck in 1 month. Continue to monitor.

## 2015-12-24 NOTE — Progress Notes (Signed)
BP 112/76 mmHg  Pulse 78  Temp(Src) 98.8 F (37.1 C)  Ht 5' 3.4" (1.61 m)  Wt 211 lb (95.709 kg)  BMI 36.92 kg/m2  SpO2 95%   Subjective:    Patient ID: [name], female    DOB: 1950/11/11, 65 y.o.   MRN: [id number]  HPI: [name] is a 64 y.o. female  Chief Complaint  Patient presents with  . Hypertension    Refill on Atenlol   . COPD    Refill on Symbicort  . Depression    Refill on zoloft   Here today for re-certification of her oxygen, so didn't wear it on her way here. Form for oxygen filled out and faxed. Oxygen back up to normal range back on 2L oxygen in the office.   HYPERTENSION- wasn't able to come off the HCTZ all together due to swelling in her legs, but cut it in half and doing much better on her BP now.  Hypertension status: controlled  Satisfied with current treatment? yes Duration of hypertension: chronic BP monitoring frequency:  a few times a week BP medication side effects:  no Medication compliance: excellent compliance Aspirin: no Recurrent headaches: no Visual changes: no Palpitations: no Dyspnea: no Chest pain: no Lower extremity edema: no Dizzy/lightheaded: no   DEPRESSION- still having a lot of social issues going on. Still adjusting to not living with her daughter and missing her granddaughter Mood status: stable Satisfied with current treatment?: no Symptom severity: moderate  Duration of current treatment : just started about a month ago Side effects: no Medication compliance: excellent compliance Psychotherapy/counseling: no  Depressed mood: yes Anxious mood: yes Anhedonia: no Significant weight loss or gain: no Insomnia: no  Fatigue: yes Feelings of worthlessness or guilt: yes Impaired concentration/indecisiveness: no Suicidal ideations: yes Hopelessness: yes Crying spells: yes Depression screen [facility] 2/9 12/24/2015 11/22/2015 06/12/2015  Decreased Interest Down, Depressed, Hopeless PHQ - 2 Score Altered sleeping 3 - -  Tired, decreased energy 3 - -  Change in appetite 1 - -  Feeling bad or failure about yourself  1 - -  Trouble concentrating 2 - -  Moving slowly or fidgety/restless 0 - -  Suicidal thoughts 2 - -  PHQ-9 Score 17 - -  Difficult doing work/chores Very difficult - -   Quit smoking! Didn't do well with the chantix. Stopped smoking on her own!   Relevant past medical, surgical, family and social history reviewed and updated as indicated. Interim medical history since our last visit reviewed. Allergies and medications reviewed and updated.  Review of Systems  Constitutional: Negative.   Respiratory: Negative.   Cardiovascular: Negative.   Psychiatric/Behavioral: Positive for dysphoric mood. Negative for suicidal ideas, hallucinations, behavioral problems, confusion, sleep disturbance, self-injury, decreased concentration and agitation. The patient is not nervous/anxious and is not hyperactive.     Per HPI unless specifically indicated above     Objective:    BP 112/76 mmHg  Pulse 78  Temp(Src) 98.8 F (37.1 C)  Ht 5' 3.4" (1.61 m)  Wt 211 lb (95.709 kg)  BMI 36.92 kg/m2  SpO2 95%  Wt Readings from Last 3 Encounters:  12/24/15 211 lb (95.709 kg)  11/22/15 216 lb (97.977 kg)  10/22/15 219 lb (99.338 kg)    Physical Exam  Constitutional: She is oriented to person, place, and time. She appears well-developed and well-nourished. No distress.  HENT:  Head:  Normocephalic and atraumatic.  Right Ear: Hearing and external ear normal.  Left Ear: Hearing and external ear normal.  Nose: Nose normal.  Eyes: Conjunctivae, EOM and lids are normal. Pupils are equal, round, and reactive to light. Right eye exhibits no discharge. Left eye exhibits no discharge. No scleral icterus.  Cardiovascular: Normal rate, regular rhythm, normal heart sounds and intact distal pulses.  Exam reveals no gallop and no friction rub.   No murmur heard. Pulmonary/Chest: Effort normal  and breath sounds normal. No respiratory distress. She has no wheezes. She has no rales. She exhibits no tenderness.  Musculoskeletal: Normal range of motion.  Neurological: She is alert and oriented to person, place, and time.  Skin: Skin is warm, dry and intact. No rash noted. She is not diaphoretic. No erythema. No pallor.  Psychiatric: She has a normal mood and affect. Her speech is normal and behavior is normal. Judgment and thought content normal. Cognition and memory are normal.  Nursing note and vitals reviewed.   Results for orders placed or performed in visit on 10/22/15  Bayer DCA Hb A1c Waived  Result Value Ref Range   Bayer DCA Hb A1c Waived 6.6 <7.0 %  CBC With Differential/Platelet  Result Value Ref Range   WBC 14.8 (H) 3.4 - 10.8 x10E3/uL   RBC 3.53 (L) 3.77 - 5.28 x10E6/uL   Hemoglobin 11.1 11.1 - 15.9 g/dL   Hematocrit 16.1 09.6 - 46.6 %   MCV 97 79 - 97 fL   MCH 31.4 26.6 - 33.0 pg   MCHC 32.4 31.5 - 35.7 g/dL   RDW 04.5 40.9 - 81.1 %   Platelets 266 150 - 379 x10E3/uL   Neutrophils 80 %   Lymphs 16 %   MID 5 %   Neutrophils Absolute 11.8 (H) 1.4 - 7.0 x10E3/uL   Lymphocytes Absolute 2.3 0.7 - 3.1 x10E3/uL   MID (Absolute) 0.7 0.1 - 1.6 X10E3/uL  Comprehensive metabolic panel  Result Value Ref Range   Glucose 110 (H) 65 - 99 mg/dL   BUN 27 8 - 27 mg/dL   Creatinine, Ser 9.14 (H) 0.57 - 1.00 mg/dL   GFR calc non Af Amer 50 (L) >59 mL/min/1.73   GFR calc Af Amer 58 (L) >59 mL/min/1.73   BUN/Creatinine Ratio 23 11 - 26   Sodium 141 134 - 144 mmol/L   Potassium 3.6 3.5 - 5.2 mmol/L   Chloride 93 (L) 96 - 106 mmol/L   CO2 30 (H) 18 - 29 mmol/L   Calcium 9.0 8.7 - 10.3 mg/dL   Total Protein 6.5 6.0 - 8.5 g/dL   Albumin 4.0 3.6 - 4.8 g/dL   Globulin, Total 2.5 1.5 - 4.5 g/dL   Albumin/Globulin Ratio 1.6 1.1 - 2.5   Bilirubin Total <0.2 0.0 - 1.2 mg/dL   Alkaline Phosphatase 84 39 - 117 IU/L   AST 16 0 - 40 IU/L   ALT 13 0 - 32 IU/L  Lipid Panel w/o  Chol/HDL Ratio  Result Value Ref Range   Cholesterol, Total 160 100 - 199 mg/dL   Triglycerides 782 0 - 149 mg/dL   HDL 53 >95 mg/dL   VLDL Cholesterol Cal 27 5 - 40 mg/dL   LDL Calculated 80 0 - 99 mg/dL  TSH  Result Value Ref Range   TSH 1.620 0.450 - 4.500 uIU/mL      Assessment & Plan:   Problem List Items Addressed This Visit      Respiratory   COPD (  chronic obstructive pulmonary disease) (HCC)    Continue oxygen. Continue to monitor. Refills given today.      Relevant Medications   budesonide-formoterol (SYMBICORT) 160-4.5 MCG/ACT inhaler     Genitourinary   Benign hypertensive renal disease - Primary    Under better control. Continue current regimen. Continue to monitor.       Relevant Medications   atenolol (TENORMIN) 50 MG tablet     Other   Anxiety    Not under great control. We will increase to 100mg  daily and recheck in 1 month. Continue to monitor.       Relevant Medications   sertraline (ZOLOFT) 100 MG tablet       Follow up plan: Return in about 4 weeks (around 01/21/2016) for DM visit and mood.

## 2015-12-24 NOTE — Assessment & Plan Note (Signed)
Under better control. Continue current regimen. Continue to monitor.  

## 2016-01-21 ENCOUNTER — Encounter: Payer: Self-pay | Admitting: Family Medicine

## 2016-01-21 ENCOUNTER — Ambulatory Visit (INDEPENDENT_AMBULATORY_CARE_PROVIDER_SITE_OTHER): Payer: Medicare Other | Admitting: Family Medicine

## 2016-01-21 VITALS — BP 115/75 | HR 89 | Temp 98.9°F | Ht 63.5 in | Wt 217.0 lb

## 2016-01-21 DIAGNOSIS — E119 Type 2 diabetes mellitus without complications: Secondary | ICD-10-CM

## 2016-01-21 DIAGNOSIS — F419 Anxiety disorder, unspecified: Secondary | ICD-10-CM | POA: Diagnosis not present

## 2016-01-21 DIAGNOSIS — E785 Hyperlipidemia, unspecified: Secondary | ICD-10-CM | POA: Diagnosis not present

## 2016-01-21 LAB — BAYER DCA HB A1C WAIVED: HB A1C (BAYER DCA - WAIVED): 6.4 % (ref <7.0)

## 2016-01-21 MED ORDER — GABAPENTIN 100 MG PO CAPS: 200.0000 mg | ORAL_CAPSULE | Freq: Three times a day (TID) | ORAL | Status: DC

## 2016-01-21 MED ORDER — TIOTROPIUM BROMIDE MONOHYDRATE 18 MCG IN CAPS
18.0000 ug | ORAL_CAPSULE | Freq: Every day | RESPIRATORY_TRACT | Status: DC
Start: 2016-01-21 — End: 2016-07-10

## 2016-01-21 MED ORDER — SERTRALINE HCL 100 MG PO TABS: 100.0000 mg | ORAL_TABLET | Freq: Every day | ORAL | Status: DC

## 2016-01-21 MED ORDER — THEOPHYLLINE ER 400 MG PO TB24: 400.0000 mg | ORAL_TABLET | Freq: Every day | ORAL | Status: DC

## 2016-01-21 MED ORDER — ROPINIROLE HCL 0.25 MG PO TABS: 0.2500 mg | ORAL_TABLET | Freq: Three times a day (TID) | ORAL | Status: DC

## 2016-01-21 MED ORDER — PRAVASTATIN SODIUM 40 MG PO TABS: 40.0000 mg | ORAL_TABLET | Freq: Every day | ORAL | Status: DC

## 2016-01-21 NOTE — Progress Notes (Signed)
BP 115/75 mmHg  Pulse 89  Temp(Src) 98.9 F (37.2 C)  Ht 5' 3.5" (1.613 m)  Wt 217 lb (98.431 kg)  BMI 37.83 kg/m2  SpO2 90%   Subjective:    Patient ID: [name], female    DOB: 1950/11/30, 65 y.o.   MRN: [id number]  HPI: [name] is a 65 y.o. female  Chief Complaint  Patient presents with  . Diabetes  . Depression   DIABETES Hypoglycemic episodes:no Polydipsia/polyuria: no Visual disturbance: no Chest pain: no Paresthesias: no Glucose Monitoring: yes  Accucheck frequency: Daily Taking Insulin?: no Blood Pressure Monitoring: not checking Retinal Examination: Up to Date Foot Exam: Up to Date Diabetic Education: Completed Pneumovax: Up to Date Influenza: Up to Date Aspirin: yes  DEPRESSION Mood status: controlled Satisfied with current treatment?: yes Symptom severity: mild  Duration of current treatment : 2 months Side effects: no Medication compliance: excellent compliance Psychotherapy/counseling: no  Depressed mood: no Anxious mood: no Anhedonia: no Significant weight loss or gain: no Insomnia: no  Fatigue: no Feelings of worthlessness or guilt: no Impaired concentration/indecisiveness: no Suicidal ideations: no Hopelessness: no Crying spells: no Depression screen [facility] 2/9 12/24/2015 11/22/2015 06/12/2015  Decreased Interest Down, Depressed, Hopeless PHQ - 2 Score Altered sleeping 3 - -  Tired, decreased energy 3 - -  Change in appetite 1 - -  Feeling bad or failure about yourself  1 - -  Trouble concentrating 2 - -  Moving slowly or fidgety/restless 0 - -  Suicidal thoughts 2 - -  PHQ-9 Score 17 - -  Difficult doing work/chores Very difficult - -     Relevant past medical, surgical, family and social history reviewed and updated as indicated. Interim medical history since our last visit reviewed. Allergies and medications reviewed and updated.  Review of Systems  Constitutional: Negative.   Respiratory:  Negative.   Cardiovascular: Negative.   Psychiatric/Behavioral: Negative.     Per HPI unless specifically indicated above     Objective:    BP 115/75 mmHg  Pulse 89  Temp(Src) 98.9 F (37.2 C)  Ht 5' 3.5" (1.613 m)  Wt 217 lb (98.431 kg)  BMI 37.83 kg/m2  SpO2 90%  Wt Readings from Last 3 Encounters:  01/21/16 217 lb (98.431 kg)  12/24/15 211 lb (95.709 kg)  11/22/15 216 lb (97.977 kg)    Physical Exam  Constitutional: She is oriented to person, place, and time. She appears well-developed and well-nourished. No distress.  HENT:  Head: Normocephalic and atraumatic.  Right Ear: Hearing normal.  Left Ear: Hearing normal.  Nose: Nose normal.  Eyes: Conjunctivae and lids are normal. Right eye exhibits no discharge. Left eye exhibits no discharge. No scleral icterus.  Cardiovascular: Normal rate, regular rhythm, normal heart sounds and intact distal pulses.  Exam reveals no gallop and no friction rub.   No murmur heard. Pulmonary/Chest: Effort normal and breath sounds normal. No respiratory distress. She has no wheezes. She has no rales. She exhibits no tenderness.  Musculoskeletal: Normal range of motion.  Neurological: She is alert and oriented to person, place, and time.  Skin: Skin is warm, dry and intact. No rash noted. No erythema. No pallor.  Psychiatric: She has a normal mood and affect. Her speech is normal and behavior is normal. Judgment and thought content normal. Cognition and memory are normal.  Nursing note and vitals reviewed.   Results for orders  placed or performed in visit on 10/22/15  Bayer DCA Hb A1c Waived  Result Value Ref Range   Bayer DCA Hb A1c Waived 6.6 <7.0 %  CBC With Differential/Platelet  Result Value Ref Range   WBC 14.8 (H) 3.4 - 10.8 x10E3/uL   RBC 3.53 (L) 3.77 - 5.28 x10E6/uL   Hemoglobin 11.1 11.1 - 15.9 g/dL   Hematocrit 29.534.3 62.134.0 - 46.6 %   MCV 97 79 - 97 fL   MCH 31.4 26.6 - 33.0 pg   MCHC 32.4 31.5 - 35.7 g/dL   RDW 30.813.9 65.712.3 -  84.615.4 %   Platelets 266 150 - 379 x10E3/uL   Neutrophils 80 %   Lymphs 16 %   MID 5 %   Neutrophils Absolute 11.8 (H) 1.4 - 7.0 x10E3/uL   Lymphocytes Absolute 2.3 0.7 - 3.1 x10E3/uL   MID (Absolute) 0.7 0.1 - 1.6 X10E3/uL  Comprehensive metabolic panel  Result Value Ref Range   Glucose 110 (H) 65 - 99 mg/dL   BUN 27 8 - 27 mg/dL   Creatinine, Ser 9.621.16 (H) 0.57 - 1.00 mg/dL   GFR calc non Af Amer 50 (L) >59 mL/min/1.73   GFR calc Af Amer 58 (L) >59 mL/min/1.73   BUN/Creatinine Ratio 23 11 - 26   Sodium 141 134 - 144 mmol/L   Potassium 3.6 3.5 - 5.2 mmol/L   Chloride 93 (L) 96 - 106 mmol/L   CO2 30 (H) 18 - 29 mmol/L   Calcium 9.0 8.7 - 10.3 mg/dL   Total Protein 6.5 6.0 - 8.5 g/dL   Albumin 4.0 3.6 - 4.8 g/dL   Globulin, Total 2.5 1.5 - 4.5 g/dL   Albumin/Globulin Ratio 1.6 1.1 - 2.5   Bilirubin Total <0.2 0.0 - 1.2 mg/dL   Alkaline Phosphatase 84 39 - 117 IU/L   AST 16 0 - 40 IU/L   ALT 13 0 - 32 IU/L  Lipid Panel w/o Chol/HDL Ratio  Result Value Ref Range   Cholesterol, Total 160 100 - 199 mg/dL   Triglycerides 952133 0 - 149 mg/dL   HDL 53 >84>39 mg/dL   VLDL Cholesterol Cal 27 5 - 40 mg/dL   LDL Calculated 80 0 - 99 mg/dL  TSH  Result Value Ref Range   TSH 1.620 0.450 - 4.500 uIU/mL      Assessment & Plan:   Problem List Items Addressed This Visit      Endocrine   Type 2 diabetes, diet controlled (HCC) - Primary    Under good control. A1c down to 6.4. Continue to monitor. Recheck 3 months      Relevant Medications   pravastatin (PRAVACHOL) 40 MG tablet   Other Relevant Orders   Bayer DCA Hb A1c Waived     Other   Anxiety    Under good control. Continue current regimen. Continue to monitor. Call with any problems.       Relevant Medications   gabapentin (NEURONTIN) 100 MG capsule   sertraline (ZOLOFT) 100 MG tablet   rOPINIRole (REQUIP) 0.25 MG tablet   Hyperlipidemia    Refill given today. Continue to monitor.       Relevant Medications   pravastatin  (PRAVACHOL) 40 MG tablet       Follow up plan: Return in about 3 months (around 04/21/2016) for DM visit.

## 2016-01-21 NOTE — Assessment & Plan Note (Signed)
Under good control. A1c down to 6.4. Continue to monitor. Recheck 3 months

## 2016-01-21 NOTE — Assessment & Plan Note (Signed)
Refill given today. Continue to monitor.

## 2016-01-21 NOTE — Assessment & Plan Note (Signed)
Under good control. Continue current regimen. Continue to monitor. Call with any problems.

## 2016-02-05 LAB — HM DIABETES EYE EXAM

## 2016-02-17 ENCOUNTER — Telehealth: Payer: Self-pay | Admitting: Family Medicine

## 2016-02-17 MED ORDER — AZITHROMYCIN 250 MG PO TABS: ORAL_TABLET | ORAL | Status: DC

## 2016-02-17 NOTE — Telephone Encounter (Signed)
Rx sent to her pharmacy 

## 2016-02-17 NOTE — Telephone Encounter (Signed)
Patient called stating that she feels like she is starting to have symptoms of bronchitis and would like Dr. Laural BenesJohnson to call her in a Z-pack if possible.

## 2016-02-17 NOTE — Telephone Encounter (Signed)
Routing to provider  

## 2016-04-24 ENCOUNTER — Other Ambulatory Visit: Payer: Self-pay | Admitting: Family Medicine

## 2016-04-24 DIAGNOSIS — I129 Hypertensive chronic kidney disease with stage 1 through stage 4 chronic kidney disease, or unspecified chronic kidney disease: Secondary | ICD-10-CM

## 2016-04-30 ENCOUNTER — Ambulatory Visit: Payer: Self-pay | Admitting: Family Medicine

## 2016-05-11 ENCOUNTER — Other Ambulatory Visit: Payer: Self-pay | Admitting: Family Medicine

## 2016-05-11 DIAGNOSIS — I129 Hypertensive chronic kidney disease with stage 1 through stage 4 chronic kidney disease, or unspecified chronic kidney disease: Secondary | ICD-10-CM

## 2016-05-19 ENCOUNTER — Encounter: Payer: Self-pay | Admitting: Family Medicine

## 2016-05-19 ENCOUNTER — Ambulatory Visit (INDEPENDENT_AMBULATORY_CARE_PROVIDER_SITE_OTHER): Payer: Medicare Other | Admitting: Family Medicine

## 2016-05-19 VITALS — BP 120/69 | HR 85 | Temp 98.6°F | Wt 213.0 lb

## 2016-05-19 DIAGNOSIS — E119 Type 2 diabetes mellitus without complications: Secondary | ICD-10-CM | POA: Diagnosis not present

## 2016-05-19 DIAGNOSIS — I129 Hypertensive chronic kidney disease with stage 1 through stage 4 chronic kidney disease, or unspecified chronic kidney disease: Secondary | ICD-10-CM | POA: Diagnosis not present

## 2016-05-19 DIAGNOSIS — J449 Chronic obstructive pulmonary disease, unspecified: Secondary | ICD-10-CM | POA: Diagnosis not present

## 2016-05-19 DIAGNOSIS — G2581 Restless legs syndrome: Secondary | ICD-10-CM | POA: Diagnosis not present

## 2016-05-19 DIAGNOSIS — N182 Chronic kidney disease, stage 2 (mild): Secondary | ICD-10-CM | POA: Diagnosis not present

## 2016-05-19 DIAGNOSIS — E785 Hyperlipidemia, unspecified: Secondary | ICD-10-CM

## 2016-05-19 DIAGNOSIS — Z720 Tobacco use: Secondary | ICD-10-CM | POA: Diagnosis not present

## 2016-05-19 DIAGNOSIS — F419 Anxiety disorder, unspecified: Secondary | ICD-10-CM | POA: Diagnosis not present

## 2016-05-19 LAB — UA/M W/RFLX CULTURE, ROUTINE
Bilirubin, UA: NEGATIVE
Glucose, UA: NEGATIVE
Ketones, UA: NEGATIVE
Leukocytes, UA: NEGATIVE
Nitrite, UA: NEGATIVE
Protein, UA: NEGATIVE
RBC, UA: NEGATIVE
Specific Gravity, UA: 1.015 (ref 1.005–1.030)
Urobilinogen, Ur: 0.2 mg/dL (ref 0.2–1.0)
pH, UA: 5.5 (ref 5.0–7.5)

## 2016-05-19 LAB — HEMOGLOBIN A1C: Hemoglobin A1C: 6.3

## 2016-05-19 LAB — LIPID PANEL PICCOLO, WAIVED
Chol/HDL Ratio Piccolo,Waive: 2.5 mg/dL
Cholesterol Piccolo, Waived: 155 mg/dL (ref <200)
HDL Chol Piccolo, Waived: 63 mg/dL (ref >59)
LDL Chol Calc Piccolo Waived: 73 mg/dL (ref <100)
Triglycerides Piccolo,Waived: 94 mg/dL (ref <150)
VLDL Chol Calc Piccolo,Waive: 19 mg/dL (ref <30)

## 2016-05-19 LAB — MICROALBUMIN, URINE WAIVED
Creatinine, Urine Waived: 100 mg/dL (ref 10–300)
Microalb, Ur Waived: 30 mg/L — ABNORMAL HIGH (ref 0–19)
Microalb/Creat Ratio: <30 mg/g (ref <30)

## 2016-05-19 LAB — BAYER DCA HB A1C WAIVED: HB A1C (BAYER DCA - WAIVED): 6.3 % (ref <7.0)

## 2016-05-19 MED ORDER — BUDESONIDE-FORMOTEROL FUMARATE 160-4.5 MCG/ACT IN AERO: 2.0000 | INHALATION_SPRAY | Freq: Two times a day (BID) | RESPIRATORY_TRACT | 1 refills | Status: DC

## 2016-05-19 MED ORDER — HYDROCHLOROTHIAZIDE 25 MG PO TABS: 12.5000 mg | ORAL_TABLET | Freq: Every day | ORAL | 1 refills | Status: DC

## 2016-05-19 MED ORDER — CLONAZEPAM 1 MG PO TABS: ORAL_TABLET | ORAL | 0 refills | Status: DC

## 2016-05-19 MED ORDER — ATENOLOL 50 MG PO TABS: 50.0000 mg | ORAL_TABLET | Freq: Every day | ORAL | 1 refills | Status: DC

## 2016-05-19 NOTE — Assessment & Plan Note (Signed)
Will get her oxygen approved. Form faxed over. Off oxygen pulse ox 86 after 20 minutes and 75% after 30. On 2L O2 back to 97 before she left

## 2016-05-19 NOTE — Assessment & Plan Note (Signed)
Under goof control. Continue current regimen. Continue to monitor. Call with concerns.

## 2016-05-19 NOTE — Assessment & Plan Note (Signed)
Stable. Continue current regimen. Continue to monitor.  

## 2016-05-19 NOTE — Progress Notes (Signed)
BP 120/69 (BP Location: Right Arm, Patient Position: Sitting, Cuff Size: Large)   Pulse 85   Temp 98.6 F (37 C)   Wt 213 lb (96.6 kg)   SpO2 97%   BMI 37.14 kg/m    Subjective:    Patient ID: [name], female    DOB: December 08, 1950, 65 y.o.   MRN: [id number]  HPI: [name] is a 65 y.o. female  Chief Complaint  Patient presents with  . Anxiety  . Hypertension  . COPD    Patient's oxygen is 86% sitting without her oxygen    Larey SeatFell- got up to sit on a rolling walker and it wasn't locked  DIABETES Hypoglycemic episodes:no Polydipsia/polyuria: no Visual disturbance: no Chest pain: no Paresthesias: no Glucose Monitoring: no Taking Insulin?: no Blood Pressure Monitoring: not checking Retinal Examination: Up to Date Foot Exam: Up to Date Diabetic Education: Completed Pneumovax: Not up to Date Influenza: Up to Date Aspirin: yes  HYPERTENSION / HYPERLIPIDEMIA Satisfied with current treatment? yes Duration of hypertension: chronic BP monitoring frequency: not checking BP medication side effects: no Duration of hyperlipidemia: chronic Cholesterol medication side effects: no Cholesterol supplements: none Medication compliance: excellent compliance Aspirin: yes Recent stressors: no Recurrent headaches: no Visual changes: no Palpitations: no Dyspnea: no Chest pain: no Lower extremity edema: no Dizzy/lightheaded: no  ANXIETY/STRESS Duration:controlled Anxious mood: yes  Excessive worrying: no Irritability: no  Sweating: no Nausea: no Palpitations:no Hyperventilation: no Panic attacks: no Agoraphobia: no  Obscessions/compulsions: no Depressed mood: yes [facility] 12/24/2015 11/22/2015 06/12/2015  Decreased Interest 3 1 1   Down, Depressed, Hopeless 2 1 1   PHQ - 2 Score 5 2 2   Altered sleeping 3 - -  Tired, decreased energy 3 - -  Change in appetite 1 - -  Feeling bad or failure about yourself  1 - -  Trouble concentrating 2 - -    Moving slowly or fidgety/restless 0 - -  Suicidal thoughts 2 - -  PHQ-9 Score 17 - -  Difficult doing work/chores Very difficult - -   Anhedonia: no Weight changes: no Insomnia: no   Hypersomnia: no Fatigue/loss of energy: no Feelings of worthlessness: no Feelings of guilt: no Impaired concentration/indecisiveness: no Suicidal ideations: no  Crying spells: no Recent Stressors/Life Changes: no  Relevant past medical, surgical, family and social history reviewed and updated as indicated. Interim medical history since our last visit reviewed. Allergies and medications reviewed and updated.  Review of Systems  Constitutional: Negative.   Respiratory: Positive for shortness of breath. Negative for apnea, cough, choking, chest tightness, wheezing and stridor.   Cardiovascular: Negative.   Psychiatric/Behavioral: Negative.     Per HPI unless specifically indicated above     Objective:    BP 120/69 (BP Location: Right Arm, Patient Position: Sitting, Cuff Size: Large)   Pulse 85   Temp 98.6 F (37 C)   Wt 213 lb (96.6 kg)   SpO2 97%   BMI 37.14 kg/m  Pulse Ox off oxygen x20 minutes: 86% Wt Readings from Last 3 Encounters:  05/19/16 213 lb (96.6 kg)  01/21/16 217 lb (98.4 kg)  12/24/15 211 lb (95.7 kg)    Physical Exam  Constitutional: [name] is oriented to person, place, and time. [name] appears well-developed and well-nourished. No distress.  HENT:  Head: Normocephalic and atraumatic.  Right Ear: Hearing normal.  Left Ear: Hearing normal.  Nose: Nose normal.  Eyes: Conjunctivae and lids are normal. Right eye exhibits no  discharge. Left eye exhibits no discharge. No scleral icterus.  Cardiovascular: Normal rate, regular rhythm, normal heart sounds and intact distal pulses.  Exam reveals no gallop and no friction rub.   No murmur heard. Pulmonary/Chest: Effort normal and breath sounds normal. No respiratory distress. [name] has no wheezes. [name] has no rales. [name] exhibits no  tenderness.  Musculoskeletal: Normal range of motion.  Neurological: [name] is alert and oriented to person, place, and time.  Skin: Skin is warm, dry and intact. No rash noted. No erythema. No pallor.  Psychiatric: [name] has a normal mood and affect. Her speech is normal and behavior is normal. Judgment and thought content normal. Cognition and memory are normal.  Nursing note and vitals reviewed.   Results for orders placed or performed in visit on 05/19/16  Hemoglobin A1c  Result Value Ref Range   Hemoglobin A1C 6.3       Assessment & Plan:   Problem List Items Addressed This Visit      Respiratory   COPD (chronic obstructive pulmonary disease) (HCC)    Will get her oxygen approved. Form faxed over. Off oxygen pulse ox 86 after 20 minutes and 75% after 30. On 2L O2 back to 97 before [name] left      Relevant Medications   budesonide-formoterol (SYMBICORT) 160-4.5 MCG/ACT inhaler     Endocrine   Type 2 diabetes, diet controlled (HCC) - Primary    Under good control A1c down to 6.3. Continue current regimen. Continue to monitor. Call with concerns.       Relevant Orders   Bayer DCA Hb A1c Waived   Comprehensive metabolic panel   Microalbumin, Urine Waived   TSH     Genitourinary   Benign hypertensive renal disease    Under goof control. Continue current regimen. Continue to monitor. Call with concerns.       Relevant Medications   atenolol (TENORMIN) 50 MG tablet   Other Relevant Orders   Comprehensive metabolic panel   Microalbumin, Urine Waived   TSH   Chronic kidney disease    Rechecking levels. Continue to monitor. On ARB.      Relevant Orders   CBC with Differential/Platelet   Comprehensive metabolic panel     Other   Anxiety    Stable. Continue current regimen. Continue to monitor.       Hyperlipidemia    Under good control. Continue current regimen. Continue to monitor.       Relevant Medications   hydrochlorothiazide (HYDRODIURIL) 25 MG tablet    atenolol (TENORMIN) 50 MG tablet   Other Relevant Orders   Comprehensive metabolic panel   Lipid Panel Piccolo, Waived   Restless legs syndrome (RLS)    Stable. Continue current regimen. Continue to monitor.       RESOLVED: Tobacco abuse   Relevant Orders   CBC with Differential/Platelet   Comprehensive metabolic panel   UA/M w/rflx Culture, Routine    Other Visit Diagnoses   None.      Follow up plan: Return in about 3 months (around 08/19/2016) for Physical.

## 2016-05-19 NOTE — Assessment & Plan Note (Signed)
Under good control A1c down to 6.3. Continue current regimen. Continue to monitor. Call with concerns.

## 2016-05-19 NOTE — Assessment & Plan Note (Signed)
Rechecking levels. Continue to monitor. On ARB.

## 2016-05-19 NOTE — Assessment & Plan Note (Signed)
Under good control. Continue current regimen. Continue to monitor.  

## 2016-05-20 ENCOUNTER — Encounter: Payer: Self-pay | Admitting: Family Medicine

## 2016-05-20 LAB — COMPREHENSIVE METABOLIC PANEL
ALT: 13 IU/L (ref 0–32)
AST: 19 IU/L (ref 0–40)
Albumin/Globulin Ratio: 1.6 (ref 1.2–2.2)
Albumin: 4.1 g/dL (ref 3.6–4.8)
Alkaline Phosphatase: 90 IU/L (ref 39–117)
BUN/Creatinine Ratio: 22 (ref 12–28)
BUN: 23 mg/dL (ref 8–27)
Bilirubin Total: <0.2 mg/dL (ref 0.0–1.2)
CO2: 32 mmol/L — ABNORMAL HIGH (ref 18–29)
Calcium: 10.3 mg/dL (ref 8.7–10.3)
Chloride: 96 mmol/L (ref 96–106)
Creatinine, Ser: 1.05 mg/dL — ABNORMAL HIGH (ref 0.57–1.00)
GFR calc Af Amer: 65 mL/min/{1.73_m2} (ref >59)
GFR calc non Af Amer: 56 mL/min/{1.73_m2} — ABNORMAL LOW (ref >59)
Globulin, Total: 2.5 g/dL (ref 1.5–4.5)
Glucose: 106 mg/dL — ABNORMAL HIGH (ref 65–99)
Potassium: 3.3 mmol/L — ABNORMAL LOW (ref 3.5–5.2)
Sodium: 143 mmol/L (ref 134–144)
Total Protein: 6.6 g/dL (ref 6.0–8.5)

## 2016-05-20 LAB — CBC WITH DIFFERENTIAL/PLATELET
Basophils Absolute: 0 10*3/uL (ref 0.0–0.2)
Basos: 0 %
EOS (ABSOLUTE): 0.3 10*3/uL (ref 0.0–0.4)
Eos: 3 %
Hematocrit: 34.4 % (ref 34.0–46.6)
Hemoglobin: 11.3 g/dL (ref 11.1–15.9)
Immature Grans (Abs): 0 10*3/uL (ref 0.0–0.1)
Immature Granulocytes: 0 %
Lymphocytes Absolute: 1.5 10*3/uL (ref 0.7–3.1)
Lymphs: 17 %
MCH: 29.4 pg (ref 26.6–33.0)
MCHC: 32.8 g/dL (ref 31.5–35.7)
MCV: 89 fL (ref 79–97)
Monocytes Absolute: 0.5 10*3/uL (ref 0.1–0.9)
Monocytes: 5 %
Neutrophils Absolute: 6.8 10*3/uL (ref 1.4–7.0)
Neutrophils: 75 %
Platelets: 303 10*3/uL (ref 150–379)
RBC: 3.85 x10E6/uL (ref 3.77–5.28)
RDW: 14 % (ref 12.3–15.4)
WBC: 9.2 10*3/uL (ref 3.4–10.8)

## 2016-05-20 LAB — TSH: TSH: 1.68 u[IU]/mL (ref 0.450–4.500)

## 2016-06-19 DIAGNOSIS — J449 Chronic obstructive pulmonary disease, unspecified: Secondary | ICD-10-CM | POA: Diagnosis not present

## 2016-06-23 ENCOUNTER — Telehealth: Payer: Self-pay

## 2016-06-23 MED ORDER — CIPROFLOXACIN HCL 250 MG PO TABS: 250.0000 mg | ORAL_TABLET | Freq: Two times a day (BID) | ORAL | 0 refills | Status: DC

## 2016-06-23 NOTE — Telephone Encounter (Signed)
Patient notified

## 2016-06-23 NOTE — Telephone Encounter (Signed)
Patient called, she has a UTI and is in a lot of pain. She does not have a way to get here for an appointment. She would like to know if there is something that you can give her or any advice.

## 2016-06-23 NOTE — Telephone Encounter (Signed)
Rx sent to her pharmacy. If she's not better by the time it's done, she'll need to be seen.

## 2016-07-02 ENCOUNTER — Other Ambulatory Visit: Payer: Self-pay | Admitting: Family Medicine

## 2016-07-10 ENCOUNTER — Other Ambulatory Visit: Payer: Self-pay | Admitting: Family Medicine

## 2016-07-10 DIAGNOSIS — F419 Anxiety disorder, unspecified: Secondary | ICD-10-CM

## 2016-07-19 DIAGNOSIS — J449 Chronic obstructive pulmonary disease, unspecified: Secondary | ICD-10-CM | POA: Diagnosis not present

## 2016-08-03 ENCOUNTER — Other Ambulatory Visit: Payer: Self-pay | Admitting: Family Medicine

## 2016-08-10 ENCOUNTER — Ambulatory Visit (INDEPENDENT_AMBULATORY_CARE_PROVIDER_SITE_OTHER): Payer: Medicare Other | Admitting: Family Medicine

## 2016-08-10 ENCOUNTER — Encounter: Payer: Self-pay | Admitting: Family Medicine

## 2016-08-10 VITALS — BP 99/65 | HR 75 | Temp 97.7°F | Ht 64.4 in | Wt 210.6 lb

## 2016-08-10 DIAGNOSIS — N182 Chronic kidney disease, stage 2 (mild): Secondary | ICD-10-CM | POA: Diagnosis not present

## 2016-08-10 DIAGNOSIS — Z23 Encounter for immunization: Secondary | ICD-10-CM

## 2016-08-10 DIAGNOSIS — J449 Chronic obstructive pulmonary disease, unspecified: Secondary | ICD-10-CM | POA: Diagnosis not present

## 2016-08-10 DIAGNOSIS — I129 Hypertensive chronic kidney disease with stage 1 through stage 4 chronic kidney disease, or unspecified chronic kidney disease: Secondary | ICD-10-CM

## 2016-08-10 DIAGNOSIS — Z0000 Encounter for general adult medical examination without abnormal findings: Secondary | ICD-10-CM | POA: Diagnosis not present

## 2016-08-10 DIAGNOSIS — E782 Mixed hyperlipidemia: Secondary | ICD-10-CM

## 2016-08-10 DIAGNOSIS — G2581 Restless legs syndrome: Secondary | ICD-10-CM

## 2016-08-10 DIAGNOSIS — F419 Anxiety disorder, unspecified: Secondary | ICD-10-CM | POA: Diagnosis not present

## 2016-08-10 DIAGNOSIS — E119 Type 2 diabetes mellitus without complications: Secondary | ICD-10-CM

## 2016-08-10 DIAGNOSIS — Z114 Encounter for screening for human immunodeficiency virus [HIV]: Secondary | ICD-10-CM | POA: Diagnosis not present

## 2016-08-10 MED ORDER — PRAVASTATIN SODIUM 40 MG PO TABS: 40.0000 mg | ORAL_TABLET | Freq: Every day | ORAL | 1 refills | Status: DC

## 2016-08-10 MED ORDER — ATENOLOL 50 MG PO TABS: 50.0000 mg | ORAL_TABLET | Freq: Every day | ORAL | 1 refills | Status: DC

## 2016-08-10 MED ORDER — LOSARTAN POTASSIUM 25 MG PO TABS: 50.0000 mg | ORAL_TABLET | Freq: Every day | ORAL | 1 refills | Status: DC

## 2016-08-10 MED ORDER — LOSARTAN POTASSIUM 50 MG PO TABS: 50.0000 mg | ORAL_TABLET | Freq: Every day | ORAL | 1 refills | Status: DC

## 2016-08-10 MED ORDER — ALBUTEROL SULFATE (2.5 MG/3ML) 0.083% IN NEBU: 2.5000 mg | INHALATION_SOLUTION | Freq: Four times a day (QID) | RESPIRATORY_TRACT | 3 refills | Status: DC | PRN

## 2016-08-10 MED ORDER — CLONAZEPAM 1 MG PO TABS: ORAL_TABLET | ORAL | 0 refills | Status: DC

## 2016-08-10 MED ORDER — ALBUTEROL SULFATE HFA 108 (90 BASE) MCG/ACT IN AERS: INHALATION_SPRAY | RESPIRATORY_TRACT | 12 refills | Status: DC

## 2016-08-10 MED ORDER — BUDESONIDE-FORMOTEROL FUMARATE 160-4.5 MCG/ACT IN AERO: 2.0000 | INHALATION_SPRAY | Freq: Two times a day (BID) | RESPIRATORY_TRACT | 4 refills | Status: DC

## 2016-08-10 NOTE — Assessment & Plan Note (Signed)
Checking labs today. Continue current regimen. Continue to monitor.  

## 2016-08-10 NOTE — Assessment & Plan Note (Signed)
BP too low and she's getting dizzy. Will cut losartan in 1/2 and recheck 1 month.

## 2016-08-10 NOTE — Assessment & Plan Note (Signed)
Rechecking levels today. Await results.  

## 2016-08-10 NOTE — Assessment & Plan Note (Signed)
Stable. Continue current regimen. Continue to monitor. Call with any concerns.  

## 2016-08-10 NOTE — Assessment & Plan Note (Signed)
Exacerbated due to family issues. Continue current regimen. Continue to monitor.

## 2016-08-10 NOTE — Assessment & Plan Note (Signed)
A1c checked today. Call with any concerns. Continue to monitor.

## 2016-08-10 NOTE — Addendum Note (Signed)
Addended by: Dorcas CarrowJOHNSON, MEGAN P on: 08/10/2016 03:25 PM   Modules accepted: Orders

## 2016-08-10 NOTE — Patient Instructions (Addendum)
Preventative Services:  Health Risk Assessment and Personalized Prevention Plan: Done today Bone Mass Measurements: N/A Breast Cancer Screening: Declined CVD Screening: Done today Cervical Cancer Screening: Declined Colon Cancer Screening: Declined Depression Screening: Done today Diabetes Screening: Done today Glaucoma Screening: See your eye doctor Hepatitis B vaccine: N/A Hepatitis C screening: Done previously HIV Screening: Done today Flu Vaccine: up to date Lung cancer Screening: N/A Obesity Screening: Done today Pneumonia Vaccines (2): Up to date- due for 2nd one next year STI Screening: N/AHealth Maintenance, Female Adopting a healthy lifestyle and getting preventive care can go a long way to promote health and wellness. Talk with your health care provider about what schedule of regular examinations is right for you. This is a good chance for you to check in with your provider about disease prevention and staying healthy. In between checkups, there are plenty of things you can do on your own. Experts have done a lot of research about which lifestyle changes and preventive measures are most likely to keep you healthy. Ask your health care provider for more information. WEIGHT AND DIET  Eat a healthy diet  Be sure to include plenty of vegetables, fruits, low-fat dairy products, and lean protein.  Do not eat a lot of foods high in solid fats, added sugars, or salt.  Get regular exercise. This is one of the most important things you can do for your health.  Most adults should exercise for at least 150 minutes each week. The exercise should increase your heart rate and make you sweat (moderate-intensity exercise).  Most adults should also do strengthening exercises at least twice a week. This is in addition to the moderate-intensity exercise.  Maintain a healthy weight  Body mass index (BMI) is a measurement that can be used to identify possible weight problems. It estimates body  fat based on height and weight. Your health care provider can help determine your BMI and help you achieve or maintain a healthy weight.  For females 72 years of age and older:   A BMI below 18.5 is considered underweight.  A BMI of 18.5 to 24.9 is normal.  A BMI of 25 to 29.9 is considered overweight.  A BMI of 30 and above is considered obese.  Watch levels of cholesterol and blood lipids  You should start having your blood tested for lipids and cholesterol at 64 years of age, then have this test every 5 years.  You may need to have your cholesterol levels checked more often if:  Your lipid or cholesterol levels are high.  You are older than 64 years of age.  You are at high risk for heart disease.  CANCER SCREENING   Lung Cancer  Lung cancer screening is recommended for adults 84-24 years old who are at high risk for lung cancer because of a history of smoking.  A yearly low-dose CT scan of the lungs is recommended for people who:  Currently smoke.  Have quit within the past 15 years.  Have at least a 30-pack-year history of smoking. A pack year is smoking an average of one pack of cigarettes a day for 1 year.  Yearly screening should continue until it has been 15 years since you quit.  Yearly screening should stop if you develop a health problem that would prevent you from having lung cancer treatment.  Breast Cancer  Practice breast self-awareness. This means understanding how your breasts normally appear and feel.  It also means doing regular breast self-exams. Let your  health care provider know about any changes, no matter how small.  If you are in your 20s or 30s, you should have a clinical breast exam (CBE) by a health care provider every 1-3 years as part of a regular health exam.  If you are 10 or older, have a CBE every year. Also consider having a breast X-ray (mammogram) every year.  If you have a family history of breast cancer, talk to your health  care provider about genetic screening.  If you are at high risk for breast cancer, talk to your health care provider about having an MRI and a mammogram every year.  Breast cancer gene (BRCA) assessment is recommended for women who have family members with BRCA-related cancers. BRCA-related cancers include:  Breast.  Ovarian.  Tubal.  Peritoneal cancers.  Results of the assessment will determine the need for genetic counseling and BRCA1 and BRCA2 testing. Cervical Cancer Your health care provider may recommend that you be screened regularly for cancer of the pelvic organs (ovaries, uterus, and vagina). This screening involves a pelvic examination, including checking for microscopic changes to the surface of your cervix (Pap test). You may be encouraged to have this screening done every 3 years, beginning at age 4.  For women ages 66-65, health care providers may recommend pelvic exams and Pap testing every 3 years, or they may recommend the Pap and pelvic exam, combined with testing for human papilloma virus (HPV), every 5 years. Some types of HPV increase your risk of cervical cancer. Testing for HPV may also be done on women of any age with unclear Pap test results.  Other health care providers may not recommend any screening for nonpregnant women who are considered low risk for pelvic cancer and who do not have symptoms. Ask your health care provider if a screening pelvic exam is right for you.  If you have had past treatment for cervical cancer or a condition that could lead to cancer, you need Pap tests and screening for cancer for at least 20 years after your treatment. If Pap tests have been discontinued, your risk factors (such as having a new sexual partner) need to be reassessed to determine if screening should resume. Some women have medical problems that increase the chance of getting cervical cancer. In these cases, your health care provider may recommend more frequent screening  and Pap tests. Colorectal Cancer  This type of cancer can be detected and often prevented.  Routine colorectal cancer screening usually begins at 64 years of age and continues through 64 years of age.  Your health care provider may recommend screening at an earlier age if you have risk factors for colon cancer.  Your health care provider may also recommend using home test kits to check for hidden blood in the stool.  A small camera at the end of a tube can be used to examine your colon directly (sigmoidoscopy or colonoscopy). This is done to check for the earliest forms of colorectal cancer.  Routine screening usually begins at age 41.  Direct examination of the colon should be repeated every 5-10 years through 64 years of age. However, you may need to be screened more often if early forms of precancerous polyps or small growths are found. Skin Cancer  Check your skin from head to toe regularly.  Tell your health care provider about any new moles or changes in moles, especially if there is a change in a mole's shape or color.  Also tell your health  care provider if you have a mole that is larger than the size of a pencil eraser.  Always use sunscreen. Apply sunscreen liberally and repeatedly throughout the day.  Protect yourself by wearing long sleeves, pants, a wide-brimmed hat, and sunglasses whenever you are outside. HEART DISEASE, DIABETES, AND HIGH BLOOD PRESSURE   High blood pressure causes heart disease and increases the risk of stroke. High blood pressure is more likely to develop in:  People who have blood pressure in the high end of the normal range (130-139/85-89 mm Hg).  People who are overweight or obese.  People who are African American.  If you are 110-57 years of age, have your blood pressure checked every 3-5 years. If you are 22 years of age or older, have your blood pressure checked every year. You should have your blood pressure measured twice--once when you  are at a hospital or clinic, and once when you are not at a hospital or clinic. Record the average of the two measurements. To check your blood pressure when you are not at a hospital or clinic, you can use:  An automated blood pressure machine at a pharmacy.  A home blood pressure monitor.  If you are between 72 years and 70 years old, ask your health care provider if you should take aspirin to prevent strokes.  Have regular diabetes screenings. This involves taking a blood sample to check your fasting blood sugar level.  If you are at a normal weight and have a low risk for diabetes, have this test once every three years after 64 years of age.  If you are overweight and have a high risk for diabetes, consider being tested at a younger age or more often. PREVENTING INFECTION  Hepatitis B  If you have a higher risk for hepatitis B, you should be screened for this virus. You are considered at high risk for hepatitis B if:  You were born in a country where hepatitis B is common. Ask your health care provider which countries are considered high risk.  Your parents were born in a high-risk country, and you have not been immunized against hepatitis B (hepatitis B vaccine).  You have HIV or AIDS.  You use needles to inject street drugs.  You live with someone who has hepatitis B.  You have had sex with someone who has hepatitis B.  You get hemodialysis treatment.  You take certain medicines for conditions, including cancer, organ transplantation, and autoimmune conditions. Hepatitis C  Blood testing is recommended for:  Everyone born from [birth year] through 1964.  Anyone with known risk factors for hepatitis C. Sexually transmitted infections (STIs)  You should be screened for sexually transmitted infections (STIs) including gonorrhea and chlamydia if:  You are sexually active and are younger than 64 years of age.  You are older than 64 years of age and your health care provider  tells you that you are at risk for this type of infection.  Your sexual activity has changed since you were last screened and you are at an increased risk for chlamydia or gonorrhea. Ask your health care provider if you are at risk.  If you do not have HIV, but are at risk, it may be recommended that you take a prescription medicine daily to prevent HIV infection. This is called pre-exposure prophylaxis (PrEP). You are considered at risk if:  You are sexually active and do not regularly use condoms or know the HIV status of your partner(s).  You take  drugs by injection.  You are sexually active with a partner who has HIV. Talk with your health care provider about whether you are at high risk of being infected with HIV. If you choose to begin PrEP, you should first be tested for HIV. You should then be tested every 3 months for as long as you are taking PrEP.  PREGNANCY   If you are premenopausal and you may become pregnant, ask your health care provider about preconception counseling.  If you may become pregnant, take 400 to 800 micrograms (mcg) of folic acid every day.  If you want to prevent pregnancy, talk to your health care provider about birth control (contraception). OSTEOPOROSIS AND MENOPAUSE   Osteoporosis is a disease in which the bones lose minerals and strength with aging. This can result in serious bone fractures. Your risk for osteoporosis can be identified using a bone density scan.  If you are 38 years of age or older, or if you are at risk for osteoporosis and fractures, ask your health care provider if you should be screened.  Ask your health care provider whether you should take a calcium or vitamin D supplement to lower your risk for osteoporosis.  Menopause may have certain physical symptoms and risks.  Hormone replacement therapy may reduce some of these symptoms and risks. Talk to your health care provider about whether hormone replacement therapy is right for  you.  HOME CARE INSTRUCTIONS   Schedule regular health, dental, and eye exams.  Stay current with your immunizations.   Do not use any tobacco products including cigarettes, chewing tobacco, or electronic cigarettes.  If you are pregnant, do not drink alcohol.  If you are breastfeeding, limit how much and how often you drink alcohol.  Limit alcohol intake to no more than 1 drink per day for nonpregnant women. One drink equals 12 ounces of beer, 5 ounces of wine, or 1 ounces of hard liquor.  Do not use street drugs.  Do not share needles.  Ask your health care provider for help if you need support or information about quitting drugs.  Tell your health care provider if you often feel depressed.  Tell your health care provider if you have ever been abused or do not feel safe at home.   This information is not intended to replace advice given to you by your health care provider. Make sure you discuss any questions you have with your health care provider.   Document Released: 03/30/2011 Document Revised: 10/05/2014 Document Reviewed: 08/16/2013 Elsevier Interactive Patient Education [employer]. Menopause is a normal process in which your reproductive ability comes to an end. This process happens gradually over a span of months to years, usually between the ages of 47 and 63. Menopause is complete when you have missed 12 consecutive menstrual periods. It is important to talk with your health care provider about some of the most common conditions that affect postmenopausal women, such as heart disease, cancer, and bone loss (osteoporosis). Adopting a healthy lifestyle and getting preventive care can help to promote your health and wellness. Those actions can also lower your chances of developing some of these common conditions. WHAT SHOULD I KNOW ABOUT MENOPAUSE? During menopause, you may experience a number of symptoms, such as:  Moderate-to-severe hot flashes.  Night  sweats.  Decrease in sex drive.  Mood swings.  Headaches.  Tiredness.  Irritability.  Memory problems.  Insomnia. Choosing to treat or not to treat menopausal changes is an individual decision  that you make with your health care provider. WHAT SHOULD I KNOW ABOUT HORMONE REPLACEMENT THERAPY AND SUPPLEMENTS? Hormone therapy products are effective for treating symptoms that are associated with menopause, such as hot flashes and night sweats. Hormone replacement carries certain risks, especially as you become older. If you are thinking about using estrogen or estrogen with progestin treatments, discuss the benefits and risks with your health care provider. WHAT SHOULD I KNOW ABOUT HEART DISEASE AND STROKE? Heart disease, heart attack, and stroke become more likely as you age. This may be due, in part, to the hormonal changes that your body experiences during menopause. These can affect how your body processes dietary fats, triglycerides, and cholesterol. Heart attack and stroke are both medical emergencies. There are many things that you can do to help prevent heart disease and stroke:  Have your blood pressure checked at least every 1-2 years. High blood pressure causes heart disease and increases the risk of stroke.  If you are 53-69 years old, ask your health care provider if you should take aspirin to prevent a heart attack or a stroke.  Do not use any tobacco products, including cigarettes, chewing tobacco, or electronic cigarettes. If you need help quitting, ask your health care provider.  It is important to eat a healthy diet and maintain a healthy weight.  Be sure to include plenty of vegetables, fruits, low-fat dairy products, and lean protein.  Avoid eating foods that are high in solid fats, added sugars, or salt (sodium).  Get regular exercise. This is one of the most important things that you can do for your health.  Try to exercise for at least 150 minutes each week. The  type of exercise that you do should increase your heart rate and make you sweat. This is known as moderate-intensity exercise.  Try to do strengthening exercises at least twice each week. Do these in addition to the moderate-intensity exercise.  Know your numbers.Ask your health care provider to check your cholesterol and your blood glucose. Continue to have your blood tested as directed by your health care provider. WHAT SHOULD I KNOW ABOUT CANCER SCREENING? There are several types of cancer. Take the following steps to reduce your risk and to catch any cancer development as early as possible. Breast Cancer  Practice breast self-awareness.  This means understanding how your breasts normally appear and feel.  It also means doing regular breast self-exams. Let your health care provider know about any changes, no matter how small.  If you are 74 or older, have a clinician do a breast exam (clinical breast exam or CBE) every year. Depending on your age, family history, and medical history, it may be recommended that you also have a yearly breast X-ray (mammogram).  If you have a family history of breast cancer, talk with your health care provider about genetic screening.  If you are at high risk for breast cancer, talk with your health care provider about having an MRI and a mammogram every year.  Breast cancer (BRCA) gene test is recommended for women who have family members with BRCA-related cancers. Results of the assessment will determine the need for genetic counseling and BRCA1 and for BRCA2 testing. BRCA-related cancers include these types:  Breast. This occurs in males or females.  Ovarian.  Tubal. This may also be called fallopian tube cancer.  Cancer of the abdominal or pelvic lining (peritoneal cancer).  Prostate.  Pancreatic. Cervical, Uterine, and Ovarian Cancer Your health care provider may recommend that  you be screened regularly for cancer of the pelvic organs. These  include your ovaries, uterus, and vagina. This screening involves a pelvic exam, which includes checking for microscopic changes to the surface of your cervix (Pap test).  For women ages 21-65, health care providers may recommend a pelvic exam and a Pap test every three years. For women ages 19-65, they may recommend the Pap test and pelvic exam, combined with testing for human papilloma virus (HPV), every five years. Some types of HPV increase your risk of cervical cancer. Testing for HPV may also be done on women of any age who have unclear Pap test results.  Other health care providers may not recommend any screening for nonpregnant women who are considered low risk for pelvic cancer and have no symptoms. Ask your health care provider if a screening pelvic exam is right for you.  If you have had past treatment for cervical cancer or a condition that could lead to cancer, you need Pap tests and screening for cancer for at least 20 years after your treatment. If Pap tests have been discontinued for you, your risk factors (such as having a new sexual partner) need to be reassessed to determine if you should start having screenings again. Some women have medical problems that increase the chance of getting cervical cancer. In these cases, your health care provider may recommend that you have screening and Pap tests more often.  If you have a family history of uterine cancer or ovarian cancer, talk with your health care provider about genetic screening.  If you have vaginal bleeding after reaching menopause, tell your health care provider.  There are currently no reliable tests available to screen for ovarian cancer. Lung Cancer Lung cancer screening is recommended for adults 48-85 years old who are at high risk for lung cancer because of a history of smoking. A yearly low-dose CT scan of the lungs is recommended if you:  Currently smoke.  Have a history of at least 30 pack-years of smoking and you  currently smoke or have quit within the past 15 years. A pack-year is smoking an average of one pack of cigarettes per day for one year. Yearly screening should:  Continue until it has been 15 years since you quit.  Stop if you develop a health problem that would prevent you from having lung cancer treatment. Colorectal Cancer  This type of cancer can be detected and can often be prevented.  Routine colorectal cancer screening usually begins at age 68 and continues through age 58.  If you have risk factors for colon cancer, your health care provider may recommend that you be screened at an earlier age.  If you have a family history of colorectal cancer, talk with your health care provider about genetic screening.  Your health care provider may also recommend using home test kits to check for hidden blood in your stool.  A small camera at the end of a tube can be used to examine your colon directly (sigmoidoscopy or colonoscopy). This is done to check for the earliest forms of colorectal cancer.  Direct examination of the colon should be repeated every 5-10 years until age 71. However, if early forms of precancerous polyps or small growths are found or if you have a family history or genetic risk for colorectal cancer, you may need to be screened more often. Skin Cancer  Check your skin from head to toe regularly.  Monitor any moles. Be sure to tell your health  care provider:  About any new moles or changes in moles, especially if there is a change in a mole's shape or color.  If you have a mole that is larger than the size of a pencil eraser.  If any of your family members has a history of skin cancer, especially at a young age, talk with your health care provider about genetic screening.  Always use sunscreen. Apply sunscreen liberally and repeatedly throughout the day.  Whenever you are outside, protect yourself by wearing long sleeves, pants, a wide-brimmed hat, and  sunglasses. WHAT SHOULD I KNOW ABOUT OSTEOPOROSIS? Osteoporosis is a condition in which bone destruction happens more quickly than new bone creation. After menopause, you may be at an increased risk for osteoporosis. To help prevent osteoporosis or the bone fractures that can happen because of osteoporosis, the following is recommended:  If you are 60-102 years old, get at least 1,000 mg of calcium and at least 600 mg of vitamin D per day.  If you are older than age 72 but younger than age 25, get at least 1,200 mg of calcium and at least 600 mg of vitamin D per day.  If you are older than age 60, get at least 1,200 mg of calcium and at least 800 mg of vitamin D per day. Smoking and excessive alcohol intake increase the risk of osteoporosis. Eat foods that are rich in calcium and vitamin D, and do weight-bearing exercises several times each week as directed by your health care provider. WHAT SHOULD I KNOW ABOUT HOW MENOPAUSE AFFECTS [place]? Depression may occur at any age, but it is more common as you become older. Common symptoms of depression include:  Low or sad mood.  Changes in sleep patterns.  Changes in appetite or eating patterns.  Feeling an overall lack of motivation or enjoyment of activities that you previously enjoyed.  Frequent crying spells. Talk with your health care provider if you think that you are experiencing depression. WHAT SHOULD I KNOW ABOUT IMMUNIZATIONS? It is important that you get and maintain your immunizations. These include:  Tetanus, diphtheria, and pertussis (Tdap) booster vaccine.  Influenza every year before the flu season begins.  Pneumonia vaccine.  Shingles vaccine. Your health care provider may also recommend other immunizations.   This information is not intended to replace advice given to you by your health care provider. Make sure you discuss any questions you have with your health care provider.   Document Released: 11/06/2005  Document Revised: 10/05/2014 Document Reviewed: 05/17/2014 Elsevier Interactive Patient Education 2016 [place] Directive Advance directives are the legal documents that allow you to make choices about your health care and medical treatment if you cannot speak for yourself. Advance directives are a way for you to communicate your wishes to family, friends, and health care providers. The specified people can then convey your decisions about end-of-life care to avoid confusion if you should become unable to communicate. Ideally, the process of discussing and writing advance directives should happen over time rather than making decisions all at once. Advance directives can be modified as your situation changes, and you can change your mind at any time, even after you have signed the advance directives. Each state has its own laws regarding advance directives. You may want to check with your health care provider, attorney, or state representative about the law in your state. Below are some examples of advance directives. LIVING WILL A living will is a set of instructions documenting  your wishes about medical care when you cannot care for yourself. It is used if you become:  Terminally ill.  Incapacitated.  Unable to communicate.  Unable to make decisions. Items to consider in your living will include:  The use or non-use of life-sustaining equipment, such as dialysis machines and breathing machines (ventilators).  A do not resuscitate (DNR) order, which is the instruction not to use cardiopulmonary resuscitation (CPR) if breathing or heartbeat stops.  Tube feeding.  Withholding of food and fluids.  Comfort (palliative) care when the goal becomes comfort rather than a cure.  Organ and tissue donation. A living will does not give instructions about distribution of your money and property if you should pass away. It is advisable to seek the expert advice of a lawyer in drawing up  a will regarding your possessions. Decisions about taxes, beneficiaries, and asset distribution will be legally binding. This process can relieve your family and friends of any burdens surrounding disputes or questions that may come up about the allocation of your assets. DO NOT RESUSCITATE (DNR) A do not resuscitate (DNR) order is a request to not have CPR in the event that your heart stops beating or you stop breathing. Unless given other instructions, a health care provider will try to help any patient whose heart has stopped or who has stopped breathing.  HEALTH CARE PROXY AND DURABLE POWER OF ATTORNEY FOR HEALTH CARE A health care proxy is a person (agent) appointed to make medical decisions for you if you cannot. Generally, people choose someone they know well and trust to represent their preferences when they can no longer do so. You should be sure to ask this person for agreement to act as your agent. An agent may have to exercise judgment in the event of a medical decision for which your wishes are not known. The durable power of attorney for health care is the legal document that names your health care proxy. Once written, it should be:  Signed.  Notarized.  Dated.  Copied.  Witnessed.  Incorporated into your medical record. You may also want to appoint someone to manage your financial affairs if you cannot. This is called a durable power of attorney for finances. It is a separate legal document from the durable power of attorney for health care. You may choose the same person or someone different from your health care proxy to act as your agent in financial matters.   This information is not intended to replace advice given to you by your health care provider. Make sure you discuss any questions you have with your health care provider.   Document Released: 12/22/2007 Document Revised: 09/19/2013 Document Reviewed: 02/01/2013 Elsevier Interactive Patient Education [employer].

## 2016-08-10 NOTE — Assessment & Plan Note (Signed)
Stable. Continue current regimen. Call with any concerns. Refills given.  

## 2016-08-10 NOTE — Progress Notes (Addendum)
BP 99/65 (BP Location: Left Arm, Patient Position: Sitting, Cuff Size: Large)   Pulse 75   Temp 97.7 F (36.5 C)   Ht 5' 4.4" (1.636 m)   Wt 210 lb 9.6 oz (95.5 kg)   SpO2 95%   BMI 35.70 kg/m    Subjective:    Patient ID: [name], female    DOB: [birth year], 65 y.o.   MRN: [id number]  HPI: [name] is a 65 y.o. female presenting on 08/10/2016 for comprehensive medical examination. Current medical complaints include:  HYPERTENSION / HYPERLIPIDEMIA Satisfied with current treatment? yes Duration of hypertension: chronic BP monitoring frequency: not checking BP medication side effects: no Duration of hyperlipidemia: chronic Cholesterol medication side effects: no Cholesterol supplements: none Past cholesterol medications: pravastatin (pravachol) Medication compliance: excellent compliance Aspirin: yes Recent stressors: yes Recurrent headaches: yes Visual changes: no Palpitations: no Dyspnea: yes Chest pain: no Lower extremity edema: no Dizzy/lightheaded: no  DIABETES Hypoglycemic episodes:no Polydipsia/polyuria: no Visual disturbance: no Chest pain: no Paresthesias: no Glucose Monitoring: no Taking Insulin?: no Blood Pressure Monitoring: not checking Retinal Examination: Up to Date Foot Exam: Up to Date Diabetic Education: Not Completed Pneumovax: Up to Date Influenza: Up to Date Aspirin: yes  COPD COPD status: stable Satisfied with current treatment?: yes Oxygen use: yes Dyspnea frequency: daily Cough frequency: daily Rescue inhaler frequency: daily   Limitation of activity: yes Productive cough: yes Pneumovax: Up to Date Influenza: Up to Date  She currently lives with: friends  Menopausal Symptoms: no  Functional Status Survey: Is the patient deaf or have difficulty hearing?: No Does the patient have difficulty seeing, even when wearing glasses/contacts?: No Does the patient have difficulty concentrating, remembering, or making  decisions?: No Does the patient have difficulty walking or climbing stairs?: Yes Does the patient have difficulty dressing or bathing?: No Does the patient have difficulty doing errands alone such as visiting a doctor's office or shopping?: Yes  Fall Risk  08/10/2016 08/10/2016  Falls in the past year? No No    Depression Screen Depression screen [facility] 2/9 08/10/2016 12/24/2015 11/22/2015 06/12/2015  Decreased Interest 3 3 1 1   Down, Depressed, Hopeless 3 2 1 1   PHQ - 2 Score 6 5 2 2   Altered sleeping 2 3 - -  Tired, decreased energy 2 3 - -  Change in appetite 0 1 - -  Feeling bad or failure about yourself  1 1 - -  Trouble concentrating 1 2 - -  Moving slowly or fidgety/restless 0 0 - -  Suicidal thoughts 0 2 - -  PHQ-9 Score 12 17 - -  Difficult doing work/chores - Very difficult - -   Advanced Directives Does patient have a HCPOA?    no Does patient have a living will or MOST form?  no  Past Medical History:  Past Medical History:  Diagnosis Date  . Acute respiratory failure (HCC) 2015   twice  . Anxiety   . Chronic kidney disease   . COPD (chronic obstructive pulmonary disease) (HCC)   . Depression   . Diabetes mellitus without complication (HCC)   . Hyperlipidemia   . Hypertension   . Meniscus tear    right knee  . Obesity   . Restless leg syndrome   . Tobacco abuse     Surgical History:  Past Surgical History:  Procedure Laterality Date  . CHOLECYSTECTOMY    . tumor removed      Medications:  Current Outpatient Prescriptions  on File Prior to Visit  Medication Sig  . aspirin EC 81 MG tablet Take 81 mg by mouth daily.  . Calcium Carbonate-Vit D-Min (CALCIUM 1200 PO) Take by mouth.  . gabapentin (NEURONTIN) 100 MG capsule take 2 capsules by mouth three times a day  . hydrochlorothiazide (HYDRODIURIL) 25 MG tablet take 1 tablet by mouth once daily  . Multiple Vitamins-Minerals (MULTIVITAMIN GUMMIES ADULTS PO) Take by mouth.  Marland Kitchen rOPINIRole (REQUIP) 0.25 MG  tablet take 1 tablet by mouth three times a day  . sertraline (ZOLOFT) 100 MG tablet take 1 tablet by mouth once daily  . SPIRIVA HANDIHALER 18 MCG inhalation capsule inhale the contents of one capsule in the handihaler once daily  . theophylline (UNIPHYL) 400 MG 24 hr tablet take 1 tablet by mouth once daily  . Vitamin D, Cholecalciferol, 1000 units CAPS Take by mouth.   No current facility-administered medications on file prior to visit.     Allergies:  Allergies  Allergen Reactions  . Percocet [Oxycodone-Acetaminophen] Nausea And Vomiting  . Chantix [Varenicline] Other (See Comments)    Severe headache and vomiting  . Fluoxetine Other (See Comments)    Headaches  . Lisinopril Cough  . Other     Other reaction(s): Unknown Contrast  . Verapamil Other (See Comments)    Lazy    Social History:  Social History   Social History  . Marital status: Single    Spouse name: N/A  . Number of children: N/A  . Years of education: N/A   Occupational History  . Not on file.   Social History Main Topics  . Smoking status: Former Smoker    Packs/day: 0.25    Types: Cigarettes    Quit date: 12/06/2015  . Smokeless tobacco: Never Used  . Alcohol use No  . Drug use: No  . Sexual activity: No   Other Topics Concern  . Not on file   Social History Narrative  . No narrative on file   History  Smoking Status  . Former Smoker  . Packs/day: 0.25  . Types: Cigarettes  . Quit date: 12/06/2015  Smokeless Tobacco  . Never Used   History  Alcohol Use No    Family History:  Family History  Problem Relation Age of Onset  . Alcohol abuse Mother   . Heart disease Mother   . Hypertension Mother   . Mental illness Mother   . Stroke Mother   . Heart disease Father   . Hyperlipidemia Father   . Drug abuse Sister   . Migraines Sister   . Drug abuse Brother   . Alcohol abuse Brother   . Heart disease Brother   . Hyperlipidemia Brother   . Bipolar disorder Daughter   .  Anxiety disorder Daughter   . Bipolar disorder Son   . Alcohol abuse Son   . Alcohol abuse Brother   . Drug abuse Brother   . Drug abuse Brother   . Alcohol abuse Brother   . Asthma Brother   . Raynaud syndrome Brother   . Heart disease Maternal Grandmother   . Diabetes Paternal Grandmother     Past medical history, surgical history, medications, allergies, family history and social history reviewed with patient today and changes made to appropriate areas of the chart.   Review of Systems  Constitutional: Negative.   HENT: Negative.   Eyes: Negative.   Respiratory: Positive for cough, shortness of breath and wheezing. Negative for hemoptysis and sputum production.  Cardiovascular: Negative.   Gastrointestinal: Negative.   Genitourinary: Negative.   Musculoskeletal: Positive for myalgias. Negative for back pain, falls, joint pain and neck pain.  Skin: Negative.   Neurological: Positive for headaches. Negative for dizziness, tingling, tremors, sensory change, speech change, focal weakness, seizures and loss of consciousness.  Endo/Heme/Allergies: Positive for polydipsia. Negative for environmental allergies. Does not bruise/bleed easily.  Psychiatric/Behavioral: Positive for depression. Negative for hallucinations, memory loss, substance abuse and suicidal ideas. The patient is nervous/anxious and has insomnia.     All other ROS negative except what is listed above and in the HPI.      Objective:    BP 99/65 (BP Location: Left Arm, Patient Position: Sitting, Cuff Size: Large)   Pulse 75   Temp 97.7 F (36.5 C)   Ht 5' 4.4" (1.636 m)   Wt 210 lb 9.6 oz (95.5 kg)   SpO2 95%   BMI 35.70 kg/m   Wt Readings from Last 3 Encounters:  08/10/16 210 lb 9.6 oz (95.5 kg)  05/19/16 213 lb (96.6 kg)  01/21/16 217 lb (98.4 kg)     Hearing Screening   125Hz  250Hz  500Hz  1000Hz  2000Hz  3000Hz  4000Hz  6000Hz  8000Hz   Right ear:   20 20 20  20     Left ear:   20 20 20  20       Physical  Exam  Constitutional: She is oriented to person, place, and time. She appears well-developed and well-nourished. No distress.  HENT:  Head: Normocephalic and atraumatic.  Right Ear: Hearing, tympanic membrane, external ear and ear canal normal.  Left Ear: Hearing, tympanic membrane, external ear and ear canal normal.  Nose: Nose normal.  Mouth/Throat: Uvula is midline, oropharynx is clear and moist and mucous membranes are normal. No oropharyngeal exudate.  Eyes: Conjunctivae, EOM and lids are normal. Pupils are equal, round, and reactive to light. Right eye exhibits no discharge. Left eye exhibits no discharge. No scleral icterus.  Neck: Normal range of motion. Neck supple. No JVD present. No tracheal deviation present. No thyromegaly present.  Cardiovascular: Normal rate, regular rhythm, normal heart sounds and intact distal pulses.  Exam reveals no gallop and no friction rub.   No murmur heard. Pulmonary/Chest: Effort normal and breath sounds normal. No stridor. No respiratory distress. She has no wheezes. She has no rales. She exhibits no tenderness.  Abdominal: Soft. Bowel sounds are normal. She exhibits no distension and no mass. There is no tenderness. There is no rebound and no guarding.  Ventral hernia, easily reducible  Genitourinary:  Genitourinary Comments: Breast and GYN exams deferred at patient's request  Musculoskeletal: Normal range of motion. She exhibits no edema, tenderness or deformity.  Lymphadenopathy:    She has no cervical adenopathy.  Neurological: She is alert and oriented to person, place, and time. She has normal reflexes. She displays normal reflexes. No cranial nerve deficit. She exhibits normal muscle tone. Coordination normal.  Skin: Skin is warm, dry and intact. No rash noted. She is not diaphoretic. No erythema. No pallor.  Psychiatric: She has a normal mood and affect. Her speech is normal and behavior is normal. Judgment and thought content normal. Cognition  and memory are normal.  Nursing note and vitals reviewed.   6CIT Screen 08/10/2016  What Year? 0 points  What month? 0 points  What time? 0 points  Count back from 20 0 points  Months in reverse 0 points  Repeat phrase 0 points  Total Score 0    Results for  orders placed or performed in visit on 05/19/16  Bayer DCA Hb A1c Waived  Result Value Ref Range   Bayer DCA Hb A1c Waived 6.3 <7.0 %  CBC with Differential/Platelet  Result Value Ref Range   WBC 9.2 3.4 - 10.8 x10E3/uL   RBC 3.85 3.77 - 5.28 x10E6/uL   Hemoglobin 11.3 11.1 - 15.9 g/dL   Hematocrit 16.1 09.6 - 46.6 %   MCV 89 79 - 97 fL   MCH 29.4 26.6 - 33.0 pg   MCHC 32.8 31.5 - 35.7 g/dL   RDW 04.5 40.9 - 81.1 %   Platelets 303 150 - 379 x10E3/uL   Neutrophils 75 %   Lymphs 17 %   Monocytes 5 %   Eos 3 %   Basos 0 %   Neutrophils Absolute 6.8 1.4 - 7.0 x10E3/uL   Lymphocytes Absolute 1.5 0.7 - 3.1 x10E3/uL   Monocytes Absolute 0.5 0.1 - 0.9 x10E3/uL   EOS (ABSOLUTE) 0.3 0.0 - 0.4 x10E3/uL   Basophils Absolute 0.0 0.0 - 0.2 x10E3/uL   Immature Granulocytes 0 %   Immature Grans (Abs) 0.0 0.0 - 0.1 x10E3/uL  Comprehensive metabolic panel  Result Value Ref Range   Glucose 106 (H) 65 - 99 mg/dL   BUN 23 8 - 27 mg/dL   Creatinine, Ser 9.14 (H) 0.57 - 1.00 mg/dL   GFR calc non Af Amer 56 (L) >59 mL/min/1.73   GFR calc Af Amer 65 >59 mL/min/1.73   BUN/Creatinine Ratio 22 12 - 28   Sodium 143 134 - 144 mmol/L   Potassium 3.3 (L) 3.5 - 5.2 mmol/L   Chloride 96 96 - 106 mmol/L   CO2 32 (H) 18 - 29 mmol/L   Calcium 10.3 8.7 - 10.3 mg/dL   Total Protein 6.6 6.0 - 8.5 g/dL   Albumin 4.1 3.6 - 4.8 g/dL   Globulin, Total 2.5 1.5 - 4.5 g/dL   Albumin/Globulin Ratio 1.6 1.2 - 2.2   Bilirubin Total <0.2 0.0 - 1.2 mg/dL   Alkaline Phosphatase 90 39 - 117 IU/L   AST 19 0 - 40 IU/L   ALT 13 0 - 32 IU/L  Lipid Panel Piccolo, Waived  Result Value Ref Range   Cholesterol Piccolo, Waived 155 <200 mg/dL   HDL Chol Piccolo,  Waived 63 >59 mg/dL   Triglycerides Piccolo,Waived 94 <150 mg/dL   Chol/HDL Ratio Piccolo,Waive 2.5 mg/dL   LDL Chol Calc Piccolo Waived 73 <100 mg/dL   VLDL Chol Calc Piccolo,Waive 19 <30 mg/dL  Microalbumin, Urine Waived  Result Value Ref Range   Microalb, Ur Waived 30 (H) 0 - 19 mg/L   Creatinine, Urine Waived 100 10 - 300 mg/dL   Microalb/Creat Ratio <30 <30 mg/g  TSH  Result Value Ref Range   TSH 1.680 0.450 - 4.500 uIU/mL  UA/M w/rflx Culture, Routine  Result Value Ref Range   Specific Gravity, UA 1.015 1.005 - 1.030   pH, UA 5.5 5.0 - 7.5   Color, UA Yellow Yellow   Appearance Ur Clear Clear   Leukocytes, UA Negative Negative   Protein, UA Negative Negative/Trace   Glucose, UA Negative Negative   Ketones, UA Negative Negative   RBC, UA Negative Negative   Bilirubin, UA Negative Negative   Urobilinogen, Ur 0.2 0.2 - 1.0 mg/dL   Nitrite, UA Negative Negative  Hemoglobin A1c  Result Value Ref Range   Hemoglobin A1C 6.3       Assessment & Plan:   Problem List Items  Addressed This Visit      Respiratory   COPD (chronic obstructive pulmonary disease) (HCC)    Stable. Continue current regimen. Call with any concerns. Refills given.       Relevant Medications   albuterol (PROAIR HFA) 108 (90 Base) MCG/ACT inhaler   budesonide-formoterol (SYMBICORT) 160-4.5 MCG/ACT inhaler   albuterol (PROVENTIL) (2.5 MG/3ML) 0.083% nebulizer solution   Other Relevant Orders   CBC with Differential/Platelet   Comprehensive metabolic panel     Endocrine   Type 2 diabetes, diet controlled (HCC)    A1c checked today. Call with any concerns. Continue to monitor.       Relevant Medications   pravastatin (PRAVACHOL) 40 MG tablet   losartan (COZAAR) 25 MG tablet   Other Relevant Orders   Bayer DCA Hb A1c Waived   Microalbumin, Urine Waived   Comprehensive metabolic panel     Genitourinary   Benign hypertensive renal disease    BP too low and she's getting dizzy. Will cut  losartan in 1/2 and recheck 1 month.       Relevant Medications   atenolol (TENORMIN) 50 MG tablet   losartan (COZAAR) 25 MG tablet   Other Relevant Orders   Microalbumin, Urine Waived   Comprehensive metabolic panel   TSH   Chronic kidney disease    Checking labs today. Continue current regimen. Continue to monitor.       Relevant Orders   Microalbumin, Urine Waived   UA/M w/rflx Culture, Routine   Comprehensive metabolic panel     Other   Anxiety    Exacerbated due to family issues. Continue current regimen. Continue to monitor.       Relevant Orders   Comprehensive metabolic panel   TSH   Hyperlipidemia    Rechecking levels today. Await results.       Relevant Medications   pravastatin (PRAVACHOL) 40 MG tablet   atenolol (TENORMIN) 50 MG tablet   losartan (COZAAR) 25 MG tablet   Other Relevant Orders   Comprehensive metabolic panel   Lipid Panel w/o Chol/HDL Ratio   Restless legs syndrome (RLS)    Stable. Continue current regimen. Continue to monitor. Call with any concerns.       Relevant Orders   CBC with Differential/Platelet   Comprehensive metabolic panel   TSH   Iron and TIBC   Ferritin    Other Visit Diagnoses    Medicare annual wellness visit, subsequent    -  Primary   Preventative care discussed as below. Call with any concerns.    Relevant Orders   Bayer DCA Hb A1c Waived   Microalbumin, Urine Waived   UA/M w/rflx Culture, Routine   CBC with Differential/Platelet   Comprehensive metabolic panel   Lipid Panel w/o Chol/HDL Ratio   TSH   HIV antibody   Iron and TIBC   Ferritin   Immunization due       Flu shot given today.   Relevant Orders   Flu Vaccine QUAD 36+ mos PF IM (Fluarix & Fluzone Quad PF) (Completed)   Comprehensive metabolic panel   Screening for HIV without presence of risk factors       Labs drawn today.   Relevant Orders   Comprehensive metabolic panel   HIV antibody      Lab unable to draw her blood today. Labs  made future orders. Will draw next visit.  Preventative Services:  Health Risk Assessment and Personalized Prevention Plan: Done today Bone Mass Measurements: N/A Breast  Cancer Screening: Declined CVD Screening: Done today Cervical Cancer Screening: Declined Colon Cancer Screening: Declined Depression Screening: Done today Diabetes Screening: Done today Glaucoma Screening: See your eye doctor Hepatitis B vaccine: N/A Hepatitis C screening: Done previously HIV Screening: Done today Flu Vaccine: up to date Lung cancer Screening: N/A Obesity Screening: Done today Pneumonia Vaccines (2): Up to date- due for 2nd one next year STI Screening: N/A  Follow up plan: Return in about 4 weeks (around 09/07/2016) for BP follow up.   LABORATORY TESTING:  - Pap smear: Refused  IMMUNIZATIONS:   - Tdap: Tetanus vaccination status reviewed: last tetanus booster within 10 years. - Influenza: Up to date - Pneumovax: Up to date - Prevnar: Not applicable - Zostavax vaccine: Refused  SCREENING: -Mammogram: Refused  - Colonoscopy: Refused  - Bone Density: Not applicable  -Hearing Test: Ordered today  -Spirometry: Up to date   PATIENT COUNSELING:   Advised to take 1 mg of folate supplement per day if capable of pregnancy.   Sexuality: Discussed sexually transmitted diseases, partner selection, use of condoms, avoidance of unintended pregnancy  and contraceptive alternatives.   Advised to avoid cigarette smoking.  I discussed with the patient that most people either abstain from alcohol or drink within safe limits (<=14/week and <=4 drinks/occasion for males, <=7/weeks and <= 3 drinks/occasion for females) and that the risk for alcohol disorders and other health effects rises proportionally with the number of drinks per week and how often a drinker exceeds daily limits.  Discussed cessation/primary prevention of drug use and availability of treatment for abuse.   Diet: Encouraged to adjust  caloric intake to maintain  or achieve ideal body weight, to reduce intake of dietary saturated fat and total fat, to limit sodium intake by avoiding high sodium foods and not adding table salt, and to maintain adequate dietary potassium and calcium preferably from fresh fruits, vegetables, and low-fat dairy products.    stressed the importance of regular exercise  Injury prevention: Discussed safety belts, safety helmets, smoke detector, smoking near bedding or upholstery.   Dental health: Discussed importance of regular tooth brushing, flossing, and dental visits.    NEXT PREVENTATIVE PHYSICAL DUE IN 1 YEAR. Return in about 4 weeks (around 09/07/2016) for BP follow up.

## 2016-08-12 LAB — UA/M W/RFLX CULTURE, ROUTINE
Bilirubin, UA: NEGATIVE
Glucose, UA: NEGATIVE
Ketones, UA: NEGATIVE
Nitrite, UA: NEGATIVE
Specific Gravity, UA: 1.025 (ref 1.005–1.030)
Urobilinogen, Ur: 0.2 mg/dL (ref 0.2–1.0)
pH, UA: 6 (ref 5.0–7.5)

## 2016-08-12 LAB — MICROALBUMIN, URINE WAIVED
Creatinine, Urine Waived: 300 mg/dL (ref 10–300)
Microalb, Ur Waived: 150 mg/L — ABNORMAL HIGH (ref 0–19)

## 2016-08-12 LAB — URINE CULTURE, REFLEX

## 2016-08-12 LAB — MICROSCOPIC EXAMINATION: Epithelial Cells (non renal): >10 /hpf — AB (ref 0–10)

## 2016-08-12 LAB — BAYER DCA HB A1C WAIVED: HB A1C (BAYER DCA - WAIVED): 6.4 %

## 2016-08-17 ENCOUNTER — Telehealth: Payer: Self-pay

## 2016-08-17 MED ORDER — AZITHROMYCIN 250 MG PO TABS: ORAL_TABLET | ORAL | 0 refills | Status: DC

## 2016-08-17 NOTE — Telephone Encounter (Signed)
Rx sent to her pharmacy 

## 2016-08-17 NOTE — Telephone Encounter (Signed)
Patient called and states that she is out of town with her granddaughter, she started coughing and has thick green mucus. She would like to know if anything could be called [facility]   [facility]   [facility]  [address]  [phone]

## 2016-08-18 ENCOUNTER — Encounter: Payer: Self-pay | Admitting: Family Medicine

## 2016-08-19 DIAGNOSIS — J449 Chronic obstructive pulmonary disease, unspecified: Secondary | ICD-10-CM | POA: Diagnosis not present

## 2016-09-18 DIAGNOSIS — J449 Chronic obstructive pulmonary disease, unspecified: Secondary | ICD-10-CM | POA: Diagnosis not present

## 2016-09-24 ENCOUNTER — Ambulatory Visit: Payer: Self-pay | Admitting: Family Medicine

## 2016-10-02 ENCOUNTER — Ambulatory Visit: Payer: Medicare Other | Admitting: Family Medicine

## 2016-10-08 ENCOUNTER — Ambulatory Visit: Payer: Medicare Other | Admitting: Family Medicine

## 2016-10-19 ENCOUNTER — Encounter: Payer: Self-pay | Admitting: Family Medicine

## 2016-10-19 ENCOUNTER — Ambulatory Visit (INDEPENDENT_AMBULATORY_CARE_PROVIDER_SITE_OTHER): Payer: Medicare Other | Admitting: Family Medicine

## 2016-10-19 VITALS — BP 110/66 | HR 74 | Temp 98.2°F

## 2016-10-19 DIAGNOSIS — I129 Hypertensive chronic kidney disease with stage 1 through stage 4 chronic kidney disease, or unspecified chronic kidney disease: Secondary | ICD-10-CM | POA: Diagnosis not present

## 2016-10-19 DIAGNOSIS — F419 Anxiety disorder, unspecified: Secondary | ICD-10-CM

## 2016-10-19 DIAGNOSIS — J441 Chronic obstructive pulmonary disease with (acute) exacerbation: Secondary | ICD-10-CM | POA: Diagnosis not present

## 2016-10-19 DIAGNOSIS — J449 Chronic obstructive pulmonary disease, unspecified: Secondary | ICD-10-CM | POA: Diagnosis not present

## 2016-10-19 MED ORDER — CLONAZEPAM 1 MG PO TABS: ORAL_TABLET | ORAL | 0 refills | Status: DC

## 2016-10-19 MED ORDER — AZITHROMYCIN 250 MG PO TABS: ORAL_TABLET | ORAL | 0 refills | Status: DC

## 2016-10-19 MED ORDER — PREDNISONE 10 MG PO TABS: ORAL_TABLET | ORAL | 0 refills | Status: DC

## 2016-10-19 NOTE — Assessment & Plan Note (Signed)
Refill of her klonopin given today. Call with any concerns.

## 2016-10-19 NOTE — Assessment & Plan Note (Signed)
Better. Not getting dizzy as often. Continue current regimen. Continue to monitor. Call with any concerns.

## 2016-10-19 NOTE — Progress Notes (Signed)
BP 110/66 (BP Location: Left Arm, Patient Position: Sitting, Cuff Size: Large)   Pulse 74   Temp 98.2 F (36.8 C)   SpO2 93%    Subjective:    Patient ID: [name], female    DOB: 02/02/1951, 66 y.o.   MRN: [id number]  HPI: [name] is a 66 y.o. female  Chief Complaint  Patient presents with  . Hypertension  . Cough   HYPERTENSION Hypertension status: better  Satisfied with current treatment? yes Duration of hypertension: chronic BP monitoring frequency:  not checking BP medication side effects:  no Medication compliance: excellent compliance Previous BP meds: atenolol, HCTZ, losartan Aspirin: yes Recurrent headaches: no Visual changes: no Palpitations: no Dyspnea: no Chest pain: no Lower extremity edema: no Dizzy/lightheaded: no  UPPER RESPIRATORY TRACT INFECTION Duration: 2 weeks Worst symptom: cough Fever: yes Cough: yes Shortness of breath: yes Wheezing: yes Chest pain: yes, with cough Chest tightness: yes Chest congestion: yes Nasal congestion: yes Runny nose: no Post nasal drip: yes Sneezing: no Sore throat: no Swollen glands: no Sinus pressure: no Headache: no Face pain: no Toothache: no Ear pain: no  Ear pressure: no  Eyes red/itching:no Eye drainage/crusting: no  Vomiting: no Rash: no Fatigue: yes Sick contacts: yes Strep contacts: no  Context: worse Recurrent sinusitis: no Relief with OTC cold/cough medications: no  Treatments attempted: mucinex, anti-histamine and pseudoephedrine   Anxiety stable. Needs Refill of her klonopin. Call with any concerns.  Relevant past medical, surgical, family and social history reviewed and updated as indicated. Interim medical history since our last visit reviewed. Allergies and medications reviewed and updated.  Review of Systems  Constitutional: Positive for fatigue and fever. Negative for activity change, appetite change, chills, diaphoresis and unexpected weight change.  HENT:  Positive for congestion, postnasal drip, rhinorrhea and sinus pressure. Negative for dental problem, drooling, ear discharge, ear pain, facial swelling, hearing loss, mouth sores, nosebleeds, sinus pain, sneezing, sore throat, tinnitus, trouble swallowing and voice change.   Respiratory: Positive for cough, chest tightness, shortness of breath and wheezing. Negative for apnea, choking and stridor.   Cardiovascular: Negative.   Psychiatric/Behavioral: Negative.     Per HPI unless specifically indicated above     Objective:    BP 110/66 (BP Location: Left Arm, Patient Position: Sitting, Cuff Size: Large)   Pulse 74   Temp 98.2 F (36.8 C)   SpO2 93%   Wt Readings from Last 3 Encounters:  08/10/16 210 lb 9.6 oz (95.5 kg)  05/19/16 213 lb (96.6 kg)  01/21/16 217 lb (98.4 kg)    Physical Exam  Constitutional: She is oriented to person, place, and time. She appears well-developed and well-nourished. No distress.  HENT:  Head: Normocephalic and atraumatic.  Right Ear: Hearing and external ear normal.  Left Ear: Hearing and external ear normal.  Nose: Nose normal.  Mouth/Throat: Oropharynx is clear and moist. No oropharyngeal exudate.  Eyes: Conjunctivae, EOM and lids are normal. Pupils are equal, round, and reactive to light. Right eye exhibits no discharge. Left eye exhibits no discharge. No scleral icterus.  Neck: Normal range of motion. Neck supple. No JVD present. No tracheal deviation present.  Cardiovascular: Normal rate, regular rhythm, normal heart sounds and intact distal pulses.  Exam reveals no gallop and no friction rub.   No murmur heard. Pulmonary/Chest: Effort normal. No stridor. No respiratory distress. She has decreased breath sounds in the right upper field, the right middle field and the right lower  field.  Musculoskeletal: Normal range of motion.  Lymphadenopathy:    She has no cervical adenopathy.  Neurological: She is alert and oriented to person, place, and time.    Skin: Skin is warm, dry and intact. No rash noted. She is not diaphoretic. No erythema. No pallor.  Psychiatric: She has a normal mood and affect. Her speech is normal and behavior is normal. Judgment and thought content normal. Cognition and memory are normal.  Nursing note and vitals reviewed.   Results for orders placed or performed in visit on 08/10/16  Microscopic Examination  Result Value Ref Range   WBC, UA 0-5 0 - 5 /hpf   RBC, UA 0-2 0 - 2 /hpf   Epithelial Cells (non renal) >10 (A) 0 - 10 /hpf   Casts Present (A) None seen /lpf   Cast Type Hyaline casts N/A   Mucus, UA Present (A) Not Estab.   Bacteria, UA Moderate (A) None seen/Few  Bayer DCA Hb A1c Waived  Result Value Ref Range   Bayer DCA Hb A1c Waived 6.4 <7.0 %  Microalbumin, Urine Waived  Result Value Ref Range   Microalb, Ur Waived 150 (H) 0 - 19 mg/L   Creatinine, Urine Waived 300 10 - 300 mg/dL   Microalb/Creat Ratio 30-300 (H) <30 mg/g  UA/M w/rflx Culture, Routine  Result Value Ref Range   Specific Gravity, UA 1.025 1.005 - 1.030   pH, UA 6.0 5.0 - 7.5   Color, UA Yellow Yellow   Appearance Ur Cloudy (A) Clear   Leukocytes, UA Trace (A) Negative   Protein, UA 2+ (A) Negative/Trace   Glucose, UA Negative Negative   Ketones, UA Negative Negative   RBC, UA Trace (A) Negative   Bilirubin, UA Negative Negative   Urobilinogen, Ur 0.2 0.2 - 1.0 mg/dL   Nitrite, UA Negative Negative   Microscopic Examination See below:    Urinalysis Reflex Comment   Urine Culture, Routine  Result Value Ref Range   Urine Culture, Routine Final report    Urine Culture result 1 Comment       Assessment & Plan:   Problem List Items Addressed This Visit      Genitourinary   Benign hypertensive renal disease    Better. Not getting dizzy as often. Continue current regimen. Continue to monitor. Call with any concerns.         Other   Anxiety    Refill of her klonopin given today. Call with any concerns.         Other Visit Diagnoses    COPD exacerbation (HCC)    -  Primary   Will treat with prednisone and azithromycin. Call with any concerns.    Relevant Medications   predniSONE (DELTASONE) 10 MG tablet   azithromycin (ZITHROMAX) 250 MG tablet       Follow up plan: Return in about 4 weeks (around 11/16/2016) for DM visit.

## 2016-10-27 ENCOUNTER — Telehealth: Payer: Self-pay | Admitting: Family Medicine

## 2016-10-27 MED ORDER — AZITHROMYCIN 250 MG PO TABS: ORAL_TABLET | ORAL | 0 refills | Status: DC

## 2016-10-27 NOTE — Telephone Encounter (Signed)
If not better with this, will need to be seen

## 2016-10-27 NOTE — Telephone Encounter (Signed)
Patient notified

## 2016-10-27 NOTE — Telephone Encounter (Signed)
Routing to provider for advice.

## 2016-10-29 ENCOUNTER — Telehealth: Payer: Self-pay | Admitting: Family Medicine

## 2016-10-29 MED ORDER — AZITHROMYCIN 250 MG PO TABS: ORAL_TABLET | ORAL | 0 refills | Status: DC

## 2016-10-29 NOTE — Telephone Encounter (Signed)
Called and left patient a VM asking for her to please return my call. Azithromycin was sent in 10/27/16 to a [facility] pharmacy. Not sure if the patient picked this up or not.

## 2016-10-29 NOTE — Telephone Encounter (Signed)
Pt called and stated she would like to have the azithromycin (ZITHROMAX) 250 MG tablet sent to [facility] [place].

## 2016-10-29 NOTE — Telephone Encounter (Signed)
Patient returned call. She stated that she did not pick up the z-pak that was sent in the other day because it was sent to [facility] [place], [place]. Can we please re-send prescription to [facility] in [place]?

## 2016-11-16 ENCOUNTER — Ambulatory Visit: Payer: Self-pay | Admitting: Family Medicine

## 2016-11-19 DIAGNOSIS — J449 Chronic obstructive pulmonary disease, unspecified: Secondary | ICD-10-CM | POA: Diagnosis not present

## 2016-12-14 ENCOUNTER — Other Ambulatory Visit: Payer: Self-pay

## 2016-12-14 MED ORDER — THEOPHYLLINE ER 400 MG PO TB24: 400.0000 mg | ORAL_TABLET | Freq: Every day | ORAL | 1 refills | Status: DC

## 2016-12-14 NOTE — Addendum Note (Signed)
Addended by: Dorcas CarrowJOHNSON, MEGAN P on: 12/14/2016 02:29 PM   Modules accepted: Orders

## 2016-12-14 NOTE — Telephone Encounter (Signed)
Will you call this Rx in to [facility] in [place] on [address] and discontinue in [place], [place].

## 2016-12-14 NOTE — Telephone Encounter (Signed)
Rx at [facility] [place]

## 2016-12-14 NOTE — Telephone Encounter (Signed)
Patient requests refill theophylline.

## 2016-12-17 DIAGNOSIS — J449 Chronic obstructive pulmonary disease, unspecified: Secondary | ICD-10-CM | POA: Diagnosis not present

## 2016-12-25 ENCOUNTER — Other Ambulatory Visit: Payer: Self-pay | Admitting: Family Medicine

## 2016-12-25 DIAGNOSIS — F419 Anxiety disorder, unspecified: Secondary | ICD-10-CM

## 2016-12-29 ENCOUNTER — Ambulatory Visit (INDEPENDENT_AMBULATORY_CARE_PROVIDER_SITE_OTHER): Payer: Medicare Other | Admitting: Family Medicine

## 2016-12-29 ENCOUNTER — Encounter: Payer: Self-pay | Admitting: Family Medicine

## 2016-12-29 VITALS — BP 113/73 | HR 84 | Temp 98.3°F | Resp 17 | Ht 64.4 in | Wt 218.0 lb

## 2016-12-29 DIAGNOSIS — E119 Type 2 diabetes mellitus without complications: Secondary | ICD-10-CM | POA: Diagnosis not present

## 2016-12-29 DIAGNOSIS — F419 Anxiety disorder, unspecified: Secondary | ICD-10-CM | POA: Diagnosis not present

## 2016-12-29 MED ORDER — BUDESONIDE-FORMOTEROL FUMARATE 160-4.5 MCG/ACT IN AERO: 2.0000 | INHALATION_SPRAY | Freq: Two times a day (BID) | RESPIRATORY_TRACT | 4 refills | Status: DC

## 2016-12-29 MED ORDER — CLONAZEPAM 1 MG PO TABS: ORAL_TABLET | ORAL | 0 refills | Status: DC

## 2016-12-29 MED ORDER — SERTRALINE HCL 100 MG PO TABS: 100.0000 mg | ORAL_TABLET | Freq: Every day | ORAL | 1 refills | Status: DC

## 2016-12-29 NOTE — Assessment & Plan Note (Signed)
Slightly exacerbated today due to situational issues. Continue current regimen. Refills given. Call with any concerns.

## 2016-12-29 NOTE — Progress Notes (Signed)
BP 113/73 (BP Location: Left Arm, Patient Position: Sitting, Cuff Size: Normal)   Pulse 84   Temp 98.3 F (36.8 C) (Oral)   Resp 17   Ht 5' 4.4" (1.636 m)   Wt 218 lb (98.9 kg)   SpO2 90%   BMI 36.96 kg/m    Subjective:    Patient ID: [name], female    DOB: Apr 24, 1951, 66 y.o.   MRN: [id number]  HPI: [name] is a 66 y.o. female  Chief Complaint  Patient presents with  . Diabetes  . Anxiety   DIABETES Hypoglycemic episodes:no Polydipsia/polyuria: no Visual disturbance: no Chest pain: no Paresthesias: no Glucose Monitoring: no Taking Insulin?: no Blood Pressure Monitoring: not checking Retinal Examination: Up to Date Foot Exam: Up to Date Diabetic Education: Completed Pneumovax: Up to Date Influenza: Up to Date Aspirin: yes  ANXIETY/STRESS Duration:exacerbated- went to visit her daughter and son in law and it didn't go well, so she's not feeling good Anxious mood: yes  Excessive worrying: yes Irritability: yes  Sweating: no Nausea: no Palpitations:no Hyperventilation: no Panic attacks: no Agoraphobia: no  Obscessions/compulsions: no Depressed mood: yes Depression screen [facility] 2/9 12/29/2016 08/10/2016 12/24/2015 11/22/2015 06/12/2015  Decreased Interest Down, Depressed, Hopeless PHQ - 2 Score Altered sleeping - -  Tired, decreased energy - -  Change in appetite 0 0 1 - -  Feeling bad or failure about yourself  0 1 1 - -  Trouble concentrating 0 1 2 - -  Moving slowly or fidgety/restless 0 0 0 - -  Suicidal thoughts 0 0 2 - -  PHQ-9 Score - -  Difficult doing work/chores Somewhat difficult - Very difficult - -   Anhedonia: no Weight changes: yes Insomnia: no hard to stay asleep  Hypersomnia: no Fatigue/loss of energy: yes Feelings of worthlessness: yes Feelings of guilt: yes Impaired concentration/indecisiveness: no Suicidal ideations: no  Crying spells: yes Recent Stressors/Life  Changes: yes   Relationship problems: no   Family stress: yes     Financial stress: yes    Job stress: no    Recent death/loss: no  Relevant past medical, surgical, family and social history reviewed and updated as indicated. Interim medical history since our last visit reviewed. Allergies and medications reviewed and updated.  Review of Systems  Constitutional: Negative.   Respiratory: Negative.   Cardiovascular: Negative.   Psychiatric/Behavioral: Positive for dysphoric mood and sleep disturbance. Negative for agitation, behavioral problems, confusion, decreased concentration, hallucinations, self-injury and suicidal ideas. The patient is nervous/anxious. The patient is not hyperactive.     Per HPI unless specifically indicated above     Objective:    BP 113/73 (BP Location: Left Arm, Patient Position: Sitting, Cuff Size: Normal)   Pulse 84   Temp 98.3 F (36.8 C) (Oral)   Resp 17   Ht 5' 4.4" (1.636 m)   Wt 218 lb (98.9 kg)   SpO2 90%   BMI 36.96 kg/m   Wt Readings from Last 3 Encounters:  12/29/16 218 lb (98.9 kg)  08/10/16 210 lb 9.6 oz (95.5 kg)  05/19/16 213 lb (96.6 kg)    Physical Exam  Results for orders placed or performed in visit on 08/10/16  Microscopic Examination  Result Value Ref Range   WBC, UA 0-5 0 - 5 /hpf   RBC,  UA 0-2 0 - 2 /hpf   Epithelial Cells (non renal) >10 (A) 0 - 10 /hpf   Casts Present (A) None seen /lpf   Cast Type Hyaline casts N/A   Mucus, UA Present (A) Not Estab.   Bacteria, UA Moderate (A) None seen/Few  Bayer DCA Hb A1c Waived  Result Value Ref Range   Bayer DCA Hb A1c Waived 6.4 <7.0 %  Microalbumin, Urine Waived  Result Value Ref Range   Microalb, Ur Waived 150 (H) 0 - 19 mg/L   Creatinine, Urine Waived 300 10 - 300 mg/dL   Microalb/Creat Ratio 30-300 (H) <30 mg/g  UA/M w/rflx Culture, Routine  Result Value Ref Range   Specific Gravity, UA 1.025 1.005 - 1.030   pH, UA 6.0 5.0 - 7.5   Color, UA Yellow Yellow    Appearance Ur Cloudy (A) Clear   Leukocytes, UA Trace (A) Negative   Protein, UA 2+ (A) Negative/Trace   Glucose, UA Negative Negative   Ketones, UA Negative Negative   RBC, UA Trace (A) Negative   Bilirubin, UA Negative Negative   Urobilinogen, Ur 0.2 0.2 - 1.0 mg/dL   Nitrite, UA Negative Negative   Microscopic Examination See below:    Urinalysis Reflex Comment   Urine Culture, Routine  Result Value Ref Range   Urine Culture, Routine Final report    Urine Culture result 1 Comment       Assessment & Plan:   Problem List Items Addressed This Visit      Endocrine   Type 2 diabetes, diet controlled (HCC) - Primary    Rechecking A1c today. Await results. Continue diet and exercise. Call with any concerns.       Relevant Orders   Hgb A1c w/o eAG     Other   Anxiety    Slightly exacerbated today due to situational issues. Continue current regimen. Refills given. Call with any concerns.       Relevant Medications   sertraline (ZOLOFT) 100 MG tablet       Follow up plan: Return in about 3 months (around 03/30/2017) for DM/Anxiety/BP/Cholesterol visit.

## 2016-12-29 NOTE — Assessment & Plan Note (Signed)
Rechecking A1c today. Await results. Continue diet and exercise. Call with any concerns.  

## 2016-12-30 ENCOUNTER — Telehealth: Payer: Self-pay | Admitting: Family Medicine

## 2016-12-30 LAB — HGB A1C W/O EAG: Hgb A1c MFr Bld: 6.5 % — ABNORMAL HIGH (ref 4.8–5.6)

## 2016-12-30 NOTE — Telephone Encounter (Signed)
Called and gave results of A1c. No questions.

## 2017-01-07 ENCOUNTER — Telehealth: Payer: Self-pay | Admitting: Family Medicine

## 2017-01-07 NOTE — Telephone Encounter (Signed)
Can we double check that she's still on it- she should only be taking 2 puffs a day, and if she's having that much trouble with the device, we may need to change medicines. She can have a sample if she needs it and is still on it.

## 2017-01-07 NOTE — Telephone Encounter (Signed)
Dr.Johnson, Is she still on Symbicort? I do not see it in her medication list. I will give her samples, but does she need to be taking 3 puffs, twice a day, if so please write a new prescription.

## 2017-01-07 NOTE — Telephone Encounter (Signed)
Patient states she is almost out of her symbicort but pharmacy states she cannot get a refill until the 24th.  She is wanting to know if we have a sample she can pick up and also the pharmacist told her Dr Laural Benes may have to rewrite the script to say 3 puffs twice a day instead of 2.  Thanks  [phone]

## 2017-01-08 NOTE — Telephone Encounter (Signed)
Patient states that she is still taking Symbicort 160-4.5 two puffs a day. Samples given.

## 2017-01-14 ENCOUNTER — Other Ambulatory Visit: Payer: Self-pay | Admitting: Family Medicine

## 2017-01-14 DIAGNOSIS — I129 Hypertensive chronic kidney disease with stage 1 through stage 4 chronic kidney disease, or unspecified chronic kidney disease: Secondary | ICD-10-CM

## 2017-01-17 DIAGNOSIS — J449 Chronic obstructive pulmonary disease, unspecified: Secondary | ICD-10-CM | POA: Diagnosis not present

## 2017-02-16 DIAGNOSIS — J449 Chronic obstructive pulmonary disease, unspecified: Secondary | ICD-10-CM | POA: Diagnosis not present

## 2017-02-17 ENCOUNTER — Telehealth: Payer: Self-pay | Admitting: Family Medicine

## 2017-02-17 NOTE — Telephone Encounter (Signed)
Routing to provider  

## 2017-02-17 NOTE — Telephone Encounter (Signed)
Patient states that she thinks she has bronchitis again.  Patient states that she has cough, back pain, and phlegm. Patient is unable to come in for an OV.  Please call patient to advise.

## 2017-02-18 MED ORDER — PREDNISONE 10 MG PO TABS: ORAL_TABLET | ORAL | 0 refills | Status: DC

## 2017-02-18 MED ORDER — AZITHROMYCIN 250 MG PO TABS: ORAL_TABLET | ORAL | 0 refills | Status: DC

## 2017-02-18 NOTE — Telephone Encounter (Signed)
Please let her know that I've sent medicine to her pharmacy, if she doesn't get better, I'll need to see her.

## 2017-02-18 NOTE — Telephone Encounter (Signed)
Patient notified

## 2017-02-23 ENCOUNTER — Telehealth: Payer: Self-pay | Admitting: Family Medicine

## 2017-02-23 ENCOUNTER — Other Ambulatory Visit: Payer: Self-pay | Admitting: Family Medicine

## 2017-02-23 NOTE — Telephone Encounter (Signed)
Patient notified

## 2017-02-23 NOTE — Telephone Encounter (Signed)
Please let her know that she can take her klonopin 1/2-1 tab during the day as well at at bedtime to help with her nerves. I don't want her doing this for longer than a month, if it gets worse we'll have to adjust other meds, but it's a good thing for her to do to get through this tough time. I'm so glad she's feeling better and I'm so sorry she's going through this right now, we're thinking of her

## 2017-02-23 NOTE — Telephone Encounter (Signed)
Patient called, she is going through a lot. Patients roommate passed away over the weekend. Now she has to get temporary custody of roommates granddaughter.  Her nerves are really bad right now, would like to know what she can do for this.  Also she states that she is still on the antibiotics and steroids and the mucus is starting to break up.

## 2017-02-28 ENCOUNTER — Other Ambulatory Visit: Payer: Self-pay | Admitting: Family Medicine

## 2017-03-19 DIAGNOSIS — J449 Chronic obstructive pulmonary disease, unspecified: Secondary | ICD-10-CM | POA: Diagnosis not present

## 2017-04-06 ENCOUNTER — Ambulatory Visit (INDEPENDENT_AMBULATORY_CARE_PROVIDER_SITE_OTHER): Payer: Medicare Other | Admitting: Family Medicine

## 2017-04-06 ENCOUNTER — Encounter: Payer: Self-pay | Admitting: Family Medicine

## 2017-04-06 VITALS — BP 117/80 | HR 90 | Temp 98.4°F

## 2017-04-06 DIAGNOSIS — I129 Hypertensive chronic kidney disease with stage 1 through stage 4 chronic kidney disease, or unspecified chronic kidney disease: Secondary | ICD-10-CM | POA: Diagnosis not present

## 2017-04-06 DIAGNOSIS — G2581 Restless legs syndrome: Secondary | ICD-10-CM | POA: Diagnosis not present

## 2017-04-06 DIAGNOSIS — J449 Chronic obstructive pulmonary disease, unspecified: Secondary | ICD-10-CM | POA: Diagnosis not present

## 2017-04-06 DIAGNOSIS — S0501XA Injury of conjunctiva and corneal abrasion without foreign body, right eye, initial encounter: Secondary | ICD-10-CM | POA: Diagnosis not present

## 2017-04-06 DIAGNOSIS — E782 Mixed hyperlipidemia: Secondary | ICD-10-CM | POA: Diagnosis not present

## 2017-04-06 DIAGNOSIS — F419 Anxiety disorder, unspecified: Secondary | ICD-10-CM

## 2017-04-06 DIAGNOSIS — Z720 Tobacco use: Secondary | ICD-10-CM

## 2017-04-06 DIAGNOSIS — S058X1A Other injuries of right eye and orbit, initial encounter: Secondary | ICD-10-CM

## 2017-04-06 DIAGNOSIS — E119 Type 2 diabetes mellitus without complications: Secondary | ICD-10-CM

## 2017-04-06 LAB — BAYER DCA HB A1C WAIVED: HB A1C (BAYER DCA - WAIVED): 6.2 % (ref <7.0)

## 2017-04-06 MED ORDER — VARENICLINE TARTRATE 1 MG PO TABS: 1.0000 mg | ORAL_TABLET | Freq: Two times a day (BID) | ORAL | 1 refills | Status: DC

## 2017-04-06 MED ORDER — VARENICLINE TARTRATE 0.5 MG X 11 & 1 MG X 42 PO MISC: ORAL | 0 refills | Status: DC

## 2017-04-06 MED ORDER — CLONAZEPAM 1 MG PO TABS: ORAL_TABLET | ORAL | 0 refills | Status: DC

## 2017-04-06 NOTE — Assessment & Plan Note (Signed)
Under good control. Continue current regimen. Continue to monitor. Call with any concerns. 

## 2017-04-06 NOTE — Progress Notes (Signed)
BP 117/80 (BP Location: Left Arm, Patient Position: Sitting, Cuff Size: Large)   Pulse 90   Temp 98.4 F (36.9 C)   SpO2 (!) 88%    Subjective:    Patient ID: [name], female    DOB: 02/02/1951, 66 y.o.   MRN: [id number]  HPI: [name] is a 66 y.o. female  No chief complaint on file.  Had a curling iron fall into her R eye yesterday. Hurting a whole lot. Vision is blurred. She did not go to the eye doctor. She states that this has happened 2x before and both times they just patched her and left it alone.   HYPERTENSION / HYPERLIPIDEMIA Satisfied with current treatment? yes Duration of hypertension: chronic BP monitoring frequency: not checking BP medication side effects: no Past BP meds: HCTZ, losartan, atenolol Duration of hyperlipidemia: chronic Cholesterol medication side effects: no Cholesterol supplements: none Past cholesterol medications: pravastatin (pravachol) Medication compliance: excellent compliance Aspirin: yes Recent stressors: yes Recurrent headaches: no Visual changes: no Palpitations: no Dyspnea: no Chest pain: no Lower extremity edema: no Dizzy/lightheaded: no  DIABETES Hypoglycemic episodes:yes Polydipsia/polyuria: no Visual disturbance: no Chest pain: no Paresthesias: no Glucose Monitoring: no Taking Insulin?: no Blood Pressure Monitoring: not checking Retinal Examination: Up to Date Foot Exam: Not up to Date Diabetic Education: Not Completed Pneumovax: Up to Date Influenza: Up to Date Aspirin: yes  ANXIETY/STRESS Duration:exacerbated Anxious mood: yes  Excessive worrying: yes Irritability: no  Sweating: no Nausea: no Palpitations:no Hyperventilation: no Panic attacks: no Agoraphobia: no  Obscessions/compulsions: no Depressed mood: yes Depression screen [facility] 2/9 04/06/2017 04/06/2017 12/29/2016 08/10/2016 12/24/2015  Decreased Interest 1 1 1 3 3   Down, Depressed, Hopeless 2 2 2 3 2   PHQ - 2 Score 3 3 3 6 5   Altered  sleeping 2 - 2 2 3   Tired, decreased energy 2 - 3 2 3   Change in appetite 2 - 0 0 1  Feeling bad or failure about yourself  1 - 0 1 1  Trouble concentrating 1 - 0 1 2  Moving slowly or fidgety/restless 1 - 0 0 0  Suicidal thoughts 0 - 0 0 2  PHQ-9 Score 12 - 8 12 17   Difficult doing work/chores - - Somewhat difficult - Very difficult   Anhedonia: no Weight changes: no Insomnia: no   Hypersomnia: no Fatigue/loss of energy: yes Feelings of worthlessness: no Feelings of guilt: no Impaired concentration/indecisiveness: yes Suicidal ideations: no  Crying spells: no Recent Stressors/Life Changes: yes   Relationship problems: no   Family stress: yes     Financial stress: yes    Job stress: no    Recent death/loss: yes  RESTLESS LEGS Duration: chronic Discomfort description:  Creeping and crawling Pain: no Location: lower legs Bilateral: yes Symmetric: yes Severity: moderate Onset:  gradual Frequency:  constant Symptoms only occur while legs at rest: no Sudden unintentional leg jerking: no Bed partner bothered by leg movements: no LE numbness: no Decreased sensation: yes Weakness: yes Insomnia: no Daytime somnolence: no Fatigue: yes Status: stable  Relevant past medical, surgical, family and social history reviewed and updated as indicated. Interim medical history since our last visit reviewed. Allergies and medications reviewed and updated.  Review of Systems  Constitutional: Negative.   Respiratory: Negative.   Cardiovascular: Negative.   Musculoskeletal: Negative.   Psychiatric/Behavioral: Negative for agitation, behavioral problems, confusion, decreased concentration, dysphoric mood, hallucinations, self-injury, sleep disturbance and suicidal ideas. The patient is nervous/anxious. The patient is  not hyperactive.     Per HPI unless specifically indicated above     Objective:    BP 117/80 (BP Location: Left Arm, Patient Position: Sitting, Cuff Size: Large)    Pulse 90   Temp 98.4 F (36.9 C)   SpO2 (!) 88%   Wt Readings from Last 3 Encounters:  12/29/16 218 lb (98.9 kg)  08/10/16 210 lb 9.6 oz (95.5 kg)  05/19/16 213 lb (96.6 kg)    Physical Exam  Constitutional: She is oriented to person, place, and time. She appears well-developed and well-nourished. No distress.  HENT:  Head: Normocephalic and atraumatic.  Right Ear: Hearing normal.  Left Ear: Hearing normal.  Nose: Nose normal.  Eyes: Conjunctivae, EOM and lids are normal. Pupils are equal, round, and reactive to light. Right eye exhibits exudate. Right eye exhibits no chemosis, no discharge and no hordeolum. No foreign body present in the right eye. Left eye exhibits no discharge. No scleral icterus.    Cardiovascular: Normal rate, regular rhythm, normal heart sounds and intact distal pulses.  Exam reveals no gallop and no friction rub.   No murmur heard. Pulmonary/Chest: Effort normal. No respiratory distress. She has wheezes. She has no rales. She exhibits no tenderness.  Musculoskeletal: Normal range of motion.  Neurological: She is alert and oriented to person, place, and time.  Skin: Skin is warm, dry and intact. No rash noted. She is not diaphoretic. No erythema. No pallor.  Psychiatric: She has a normal mood and affect. Her speech is normal and behavior is normal. Judgment and thought content normal. Cognition and memory are normal.  Nursing note and vitals reviewed.   Results for orders placed or performed in visit on 12/29/16  Hgb A1c w/o eAG  Result Value Ref Range   Hgb A1c MFr Bld 6.5 (H) 4.8 - 5.6 %      Assessment & Plan:   Problem List Items Addressed This Visit      Respiratory   COPD (chronic obstructive pulmonary disease) (HCC)    Stable. Continue current regimen. Continue to monitor. Call with any concerns.       Relevant Medications   varenicline (CHANTIX STARTING MONTH PAK) 0.5 MG X 11 & 1 MG X 42 tablet   varenicline (CHANTIX CONTINUING MONTH PAK) 1  MG tablet     Endocrine   Type 2 diabetes, diet controlled (HCC) - Primary    A1c down to 6.1! Continue diet and exercise and continue to monitor.        Relevant Orders   Bayer DCA Hb A1c Waived   Comprehensive metabolic panel     Genitourinary   Benign hypertensive renal disease    Under good control. Continue current regimen. Continue to monitor. Call with any concerns.       Relevant Orders   Comprehensive metabolic panel     Other   Anxiety    Exacerbated by the death of her room-mate. Will continue current regimen. Continue to monitor. Call with any concerns.       Relevant Orders   Comprehensive metabolic panel   Hyperlipidemia    Under good control. Continue current regimen. Continue to monitor. Call with any concerns.       Relevant Orders   Comprehensive metabolic panel   Lipid Panel w/o Chol/HDL Ratio   Restless legs syndrome (RLS)    Under good control. Continue current regimen. Continue to monitor. Call with any concerns.       Relevant Orders  Comprehensive metabolic panel   CBC with Differential/Platelet   TSH   Ferritin    Other Visit Diagnoses    Corneal injury of right eye, initial encounter       To see [facility] today- appointment scheduled.    Tobacco abuse       Started smoking again. Will restart chantix. Recheck 3 months.        Follow up plan: Return in about 3 months (around 07/07/2017).

## 2017-04-06 NOTE — Assessment & Plan Note (Signed)
Exacerbated by the death of her room-mate. Will continue current regimen. Continue to monitor. Call with any concerns.

## 2017-04-06 NOTE — Assessment & Plan Note (Signed)
Stable. Continue current regimen. Continue to monitor. Call with any concerns.  

## 2017-04-06 NOTE — Assessment & Plan Note (Signed)
A1c down to 6.1! Continue diet and exercise and continue to monitor.

## 2017-04-06 NOTE — Patient Instructions (Signed)
[facility] [address], [place], [place] [id number]

## 2017-04-07 ENCOUNTER — Encounter: Payer: Self-pay | Admitting: Family Medicine

## 2017-04-07 LAB — CBC WITH DIFFERENTIAL/PLATELET
Basophils Absolute: 0 10*3/uL (ref 0.0–0.2)
Basos: 0 %
EOS (ABSOLUTE): 0.2 10*3/uL (ref 0.0–0.4)
Eos: 2 %
Hematocrit: 37 % (ref 34.0–46.6)
Hemoglobin: 11.7 g/dL (ref 11.1–15.9)
Immature Grans (Abs): 0 10*3/uL (ref 0.0–0.1)
Immature Granulocytes: 0 %
Lymphocytes Absolute: 1.3 10*3/uL (ref 0.7–3.1)
Lymphs: 12 %
MCH: 29.3 pg (ref 26.6–33.0)
MCHC: 31.6 g/dL (ref 31.5–35.7)
MCV: 93 fL (ref 79–97)
Monocytes Absolute: 0.7 10*3/uL (ref 0.1–0.9)
Monocytes: 6 %
Neutrophils Absolute: 8.4 10*3/uL — ABNORMAL HIGH (ref 1.4–7.0)
Neutrophils: 80 %
Platelets: 255 10*3/uL (ref 150–379)
RBC: 4 x10E6/uL (ref 3.77–5.28)
RDW: 14 % (ref 12.3–15.4)
WBC: 10.5 10*3/uL (ref 3.4–10.8)

## 2017-04-07 LAB — COMPREHENSIVE METABOLIC PANEL
ALT: 14 IU/L (ref 0–32)
AST: 18 IU/L (ref 0–40)
Albumin/Globulin Ratio: 2 (ref 1.2–2.2)
Albumin: 4.2 g/dL (ref 3.6–4.8)
Alkaline Phosphatase: 93 IU/L (ref 39–117)
BUN/Creatinine Ratio: 19 (ref 12–28)
BUN: 20 mg/dL (ref 8–27)
Bilirubin Total: <0.2 mg/dL (ref 0.0–1.2)
CO2: 35 mmol/L — ABNORMAL HIGH (ref 20–29)
Calcium: 10.2 mg/dL (ref 8.7–10.3)
Chloride: 94 mmol/L — ABNORMAL LOW (ref 96–106)
Creatinine, Ser: 1.05 mg/dL — ABNORMAL HIGH (ref 0.57–1.00)
GFR calc Af Amer: 65 mL/min/{1.73_m2} (ref >59)
GFR calc non Af Amer: 56 mL/min/{1.73_m2} — ABNORMAL LOW (ref >59)
Globulin, Total: 2.1 g/dL (ref 1.5–4.5)
Glucose: 106 mg/dL — ABNORMAL HIGH (ref 65–99)
Potassium: 3.7 mmol/L (ref 3.5–5.2)
Sodium: 143 mmol/L (ref 134–144)
Total Protein: 6.3 g/dL (ref 6.0–8.5)

## 2017-04-07 LAB — LIPID PANEL W/O CHOL/HDL RATIO
Cholesterol, Total: 156 mg/dL (ref 100–199)
HDL: 53 mg/dL (ref >39)
LDL Calculated: 78 mg/dL (ref 0–99)
Triglycerides: 125 mg/dL (ref 0–149)
VLDL Cholesterol Cal: 25 mg/dL (ref 5–40)

## 2017-04-07 LAB — TSH: TSH: 1.31 u[IU]/mL (ref 0.450–4.500)

## 2017-04-07 LAB — FERRITIN: Ferritin: 34 ng/mL (ref 15–150)

## 2017-04-08 ENCOUNTER — Telehealth: Payer: Self-pay | Admitting: Family Medicine

## 2017-04-08 MED ORDER — ALBUTEROL SULFATE (2.5 MG/3ML) 0.083% IN NEBU: 2.5000 mg | INHALATION_SOLUTION | Freq: Four times a day (QID) | RESPIRATORY_TRACT | 3 refills | Status: DC | PRN

## 2017-04-08 NOTE — Telephone Encounter (Signed)
Forward to provider

## 2017-04-08 NOTE — Telephone Encounter (Signed)
Patient needs a script sent to [facility] [place] for her Albuterol as she only received 20 vials last time and she sometimes uses 1-2 vials per day especially if the weather is really humid  Thank You  [phone]

## 2017-04-09 DIAGNOSIS — H16001 Unspecified corneal ulcer, right eye: Secondary | ICD-10-CM | POA: Diagnosis not present

## 2017-04-11 DIAGNOSIS — H16001 Unspecified corneal ulcer, right eye: Secondary | ICD-10-CM | POA: Diagnosis not present

## 2017-04-13 DIAGNOSIS — H16001 Unspecified corneal ulcer, right eye: Secondary | ICD-10-CM | POA: Diagnosis not present

## 2017-04-18 DIAGNOSIS — J449 Chronic obstructive pulmonary disease, unspecified: Secondary | ICD-10-CM | POA: Diagnosis not present

## 2017-04-19 DIAGNOSIS — H16001 Unspecified corneal ulcer, right eye: Secondary | ICD-10-CM | POA: Diagnosis not present

## 2017-05-12 ENCOUNTER — Other Ambulatory Visit: Payer: Self-pay | Admitting: Family Medicine

## 2017-05-12 DIAGNOSIS — I129 Hypertensive chronic kidney disease with stage 1 through stage 4 chronic kidney disease, or unspecified chronic kidney disease: Secondary | ICD-10-CM

## 2017-05-19 DIAGNOSIS — J449 Chronic obstructive pulmonary disease, unspecified: Secondary | ICD-10-CM | POA: Diagnosis not present

## 2017-05-24 ENCOUNTER — Other Ambulatory Visit: Payer: Self-pay | Admitting: Family Medicine

## 2017-05-24 DIAGNOSIS — E782 Mixed hyperlipidemia: Secondary | ICD-10-CM

## 2017-05-24 DIAGNOSIS — F419 Anxiety disorder, unspecified: Secondary | ICD-10-CM

## 2017-06-01 ENCOUNTER — Telehealth: Payer: Self-pay | Admitting: Family Medicine

## 2017-06-01 DIAGNOSIS — F419 Anxiety disorder, unspecified: Secondary | ICD-10-CM

## 2017-06-01 MED ORDER — GABAPENTIN 100 MG PO CAPS: ORAL_CAPSULE | ORAL | 1 refills | Status: DC

## 2017-06-01 NOTE — Telephone Encounter (Signed)
Patient called stating she had received a call from someone here. I am not sure who it could have been.  I told her I would send this message to see if it were either of you trying to reach her.  Thank You

## 2017-06-01 NOTE — Telephone Encounter (Signed)
Patient states that when she picked up her prescription, it was one of two bottles, she didn't have both bottles in her bag.

## 2017-06-01 NOTE — Telephone Encounter (Signed)
Rx sent to her pharmacy 

## 2017-06-19 DIAGNOSIS — J449 Chronic obstructive pulmonary disease, unspecified: Secondary | ICD-10-CM | POA: Diagnosis not present

## 2017-06-28 ENCOUNTER — Other Ambulatory Visit: Payer: Self-pay | Admitting: Family Medicine

## 2017-07-08 ENCOUNTER — Ambulatory Visit: Payer: Self-pay | Admitting: Family Medicine

## 2017-07-12 ENCOUNTER — Telehealth: Payer: Self-pay | Admitting: Family Medicine

## 2017-07-12 NOTE — Telephone Encounter (Signed)
Called pt to sched for Annual Wellness Visit with Nurse Health Advisor. C/b #  [phone]  Kathryn Brown ° °

## 2017-07-13 NOTE — Telephone Encounter (Signed)
Pt returned call and lm. Called back on 10/16 @ 1:15pm to give specific times available in Oct for wellness with [facility]

## 2017-07-19 DIAGNOSIS — J449 Chronic obstructive pulmonary disease, unspecified: Secondary | ICD-10-CM | POA: Diagnosis not present

## 2017-07-22 ENCOUNTER — Ambulatory Visit (INDEPENDENT_AMBULATORY_CARE_PROVIDER_SITE_OTHER): Payer: Medicare Other

## 2017-07-22 ENCOUNTER — Ambulatory Visit (INDEPENDENT_AMBULATORY_CARE_PROVIDER_SITE_OTHER): Payer: Medicare Other | Admitting: Family Medicine

## 2017-07-22 ENCOUNTER — Encounter: Payer: Self-pay | Admitting: Family Medicine

## 2017-07-22 VITALS — BP 120/75 | HR 68 | Temp 98.2°F | Resp 17 | Ht 64.0 in | Wt 214.8 lb

## 2017-07-22 VITALS — BP 120/75 | HR 68 | Temp 98.2°F | Ht 64.0 in | Wt 214.5 lb

## 2017-07-22 DIAGNOSIS — E119 Type 2 diabetes mellitus without complications: Secondary | ICD-10-CM | POA: Diagnosis not present

## 2017-07-22 DIAGNOSIS — I129 Hypertensive chronic kidney disease with stage 1 through stage 4 chronic kidney disease, or unspecified chronic kidney disease: Secondary | ICD-10-CM

## 2017-07-22 DIAGNOSIS — J449 Chronic obstructive pulmonary disease, unspecified: Secondary | ICD-10-CM

## 2017-07-22 DIAGNOSIS — Z0000 Encounter for general adult medical examination without abnormal findings: Secondary | ICD-10-CM | POA: Diagnosis not present

## 2017-07-22 DIAGNOSIS — Z23 Encounter for immunization: Secondary | ICD-10-CM

## 2017-07-22 DIAGNOSIS — S41102A Unspecified open wound of left upper arm, initial encounter: Secondary | ICD-10-CM | POA: Diagnosis not present

## 2017-07-22 DIAGNOSIS — F419 Anxiety disorder, unspecified: Secondary | ICD-10-CM

## 2017-07-22 LAB — BAYER DCA HB A1C WAIVED: HB A1C (BAYER DCA - WAIVED): 5.9 % (ref <7.0)

## 2017-07-22 MED ORDER — SERTRALINE HCL 100 MG PO TABS: 100.0000 mg | ORAL_TABLET | Freq: Every day | ORAL | 1 refills | Status: DC

## 2017-07-22 MED ORDER — ALBUTEROL SULFATE HFA 108 (90 BASE) MCG/ACT IN AERS: INHALATION_SPRAY | RESPIRATORY_TRACT | 12 refills | Status: DC

## 2017-07-22 MED ORDER — BUDESONIDE-FORMOTEROL FUMARATE 160-4.5 MCG/ACT IN AERO: 2.0000 | INHALATION_SPRAY | Freq: Two times a day (BID) | RESPIRATORY_TRACT | 4 refills | Status: DC

## 2017-07-22 MED ORDER — TIOTROPIUM BROMIDE MONOHYDRATE 18 MCG IN CAPS: ORAL_CAPSULE | RESPIRATORY_TRACT | 1 refills | Status: DC

## 2017-07-22 MED ORDER — CLONAZEPAM 1 MG PO TABS: ORAL_TABLET | ORAL | 0 refills | Status: DC

## 2017-07-22 MED ORDER — LOSARTAN POTASSIUM 50 MG PO TABS: 50.0000 mg | ORAL_TABLET | Freq: Every day | ORAL | 1 refills | Status: DC

## 2017-07-22 MED ORDER — ALBUTEROL SULFATE (2.5 MG/3ML) 0.083% IN NEBU: 2.5000 mg | INHALATION_SOLUTION | Freq: Four times a day (QID) | RESPIRATORY_TRACT | 3 refills | Status: DC | PRN

## 2017-07-22 NOTE — Progress Notes (Signed)
BP 120/75 (BP Location: Left Arm, Patient Position: Sitting, Cuff Size: Normal)   Pulse 68   Temp 98.2 F (36.8 C)   Ht 5\' 4"  (1.626 m)   Wt 214 lb 8 oz (97.3 kg)   SpO2 96%   BMI 36.82 kg/m    Subjective:    Patient ID: [name], female    DOB: [birth year], 66 y.o.   MRN: [id number]  HPI: [name] is a 66 y.o. female  Chief Complaint  Patient presents with  . Diabetes   DIABETES Hypoglycemic episodes:no Polydipsia/polyuria: no Visual disturbance: no Chest pain: no Paresthesias: yes Glucose Monitoring: yes  Accucheck frequency: Daily Taking Insulin?: no Blood Pressure Monitoring: not checking Retinal Examination: Not up to Date Foot Exam: Up to Date Diabetic Education: Completed Pneumovax: Up to Date Influenza: Up to Date Aspirin: yes  ANXIETY/STRESS Duration:stable- still having a lot of trouble since her room mate passed. She notes that her anxiety has been bad, but she's dealing with it.  Anxious mood: yes  Excessive worrying: yes Irritability: no  Sweating: no Nausea: no Palpitations:no Hyperventilation: no Panic attacks: no Agoraphobia: no  Obscessions/compulsions: no Depressed mood: yes Depression screen [facility] 2/9 07/22/2017 04/06/2017 04/06/2017 12/29/2016 08/10/2016  Decreased Interest 0 1 1 1 3   Down, Depressed, Hopeless 3 2 2 2 3   PHQ - 2 Score 3 3 3 3 6   Altered sleeping 0 2 - 2 2  Tired, decreased energy 3 2 - 3 2  Change in appetite 0 2 - 0 0  Feeling bad or failure about yourself  0 1 - 0 1  Trouble concentrating 0 1 - 0 1  Moving slowly or fidgety/restless 0 1 - 0 0  Suicidal thoughts 0 0 - 0 0  PHQ-9 Score 6 12 - 8 12  Difficult doing work/chores Somewhat difficult - - Somewhat difficult -   Anhedonia: no Weight changes: no Insomnia: no   Hypersomnia: no Fatigue/loss of energy: yes Feelings of worthlessness: no Feelings of guilt: no Impaired concentration/indecisiveness: no Suicidal ideations: no  Crying spells:  no Recent Stressors/Life Changes: yes   Relationship problems: yes   Family stress: yes     Financial stress: yes    Job stress: no    Recent death/loss: yes  Breathing has been doing well. But has been coughing a little and her ribs were hurting. Felt like she had a tickle in her throat, but better now.   SKIN LESION- bumped into a door frame about a week ago Duration: 1 week Location: L shoulder Painful: yes Itching: no Onset: sudden Context: bigger Associated signs and symptoms: red and hot   Relevant past medical, surgical, family and social history reviewed and updated as indicated. Interim medical history since our last visit reviewed. Allergies and medications reviewed and updated.  Review of Systems  Constitutional: Negative.   Respiratory: Positive for cough. Negative for apnea, choking, chest tightness, shortness of breath, wheezing and stridor.   Cardiovascular: Negative.   Skin: Positive for wound. Negative for color change, pallor and rash.  Psychiatric/Behavioral: Negative.     Per HPI unless specifically indicated above     Objective:    BP 120/75 (BP Location: Left Arm, Patient Position: Sitting, Cuff Size: Normal)   Pulse 68   Temp 98.2 F (36.8 C)   Ht 5\' 4"  (1.626 m)   Wt 214 lb 8 oz (97.3 kg)   SpO2 96%   BMI 36.82 kg/m   Wt  Readings from Last 3 Encounters:  07/22/17 214 lb 8 oz (97.3 kg)  07/22/17 214 lb 12.8 oz (97.4 kg)  12/29/16 218 lb (98.9 kg)    Physical Exam  Constitutional: She is oriented to person, place, and time. She appears well-developed and well-nourished. No distress.  HENT:  Head: Normocephalic and atraumatic.  Right Ear: Hearing and external ear normal.  Left Ear: Hearing and external ear normal.  Nose: Nose normal.  Mouth/Throat: Oropharynx is clear and moist. No oropharyngeal exudate.  Eyes: Pupils are equal, round, and reactive to light. Conjunctivae, EOM and lids are normal. Right eye exhibits no discharge. Left eye  exhibits no discharge. No scleral icterus.  Cardiovascular: Normal rate, regular rhythm, normal heart sounds and intact distal pulses.  Exam reveals no gallop and no friction rub.   No murmur heard. Pulmonary/Chest: Effort normal and breath sounds normal. No respiratory distress. She has no wheezes. She has no rales. She exhibits no tenderness.  Musculoskeletal: Normal range of motion.  Neurological: She is alert and oriented to person, place, and time.  Skin: Skin is warm, dry and intact. No rash noted. She is not diaphoretic. No erythema. No pallor.  Open wound L posterior shoulder, no redness or heat, healing well.   Psychiatric: She has a normal mood and affect. Her speech is normal and behavior is normal. Judgment and thought content normal. Cognition and memory are normal.  Nursing note and vitals reviewed.   Results for orders placed or performed in visit on 07/22/17  Bayer DCA Hb A1c Waived  Result Value Ref Range   Bayer DCA Hb A1c Waived 5.9 <7.0 %      Assessment & Plan:   Problem List Items Addressed This Visit      Respiratory   COPD (chronic obstructive pulmonary disease) (HCC)    Stable. Lungs clear. Call with any concerns.       Relevant Medications   albuterol (PROAIR HFA) 108 (90 Base) MCG/ACT inhaler   albuterol (PROVENTIL) (2.5 MG/3ML) 0.083% nebulizer solution   budesonide-formoterol (SYMBICORT) 160-4.5 MCG/ACT inhaler   tiotropium (SPIRIVA HANDIHALER) 18 MCG inhalation capsule     Endocrine   Type 2 diabetes, diet controlled (HCC) - Primary    Under good control with A1c of 5.9. Continue diet and exercise. Call with any concerns.       Relevant Medications   losartan (COZAAR) 50 MG tablet   Other Relevant Orders   Bayer DCA Hb A1c Waived (Completed)     Genitourinary   Benign hypertensive renal disease    Under good control. Continue current regimen. Call with any concerns.       Relevant Medications   losartan (COZAAR) 50 MG tablet     Other     Anxiety    Slightly exacerbated given her living situation right now. Continue current regimen. Continue to monitor. Call with any concerns.       Relevant Medications   sertraline (ZOLOFT) 100 MG tablet    Other Visit Diagnoses    Open wound of left upper arm, initial encounter       Healing well. Up to date on her td. Keep covered with neosporin. Call with any concerns.        Follow up plan: Return in about 3 months (around 10/22/2017) for DM/Chol/BP/anxiety follow up.

## 2017-07-22 NOTE — Progress Notes (Signed)
Subjective:   [name] [name] is a 66 y.o. female who presents for Medicare Annual (Subsequent) preventive examination.  Review of Systems:  Cardiac Risk Factors include: advanced age (>755men, 18>65 women);obesity (BMI >30kg/m2);diabetes mellitus;dyslipidemia;hypertension;smoking/ tobacco exposure     Objective:     Vitals: BP 120/75 (BP Location: Left Arm, Patient Position: Sitting)   Pulse 68   Temp 98.2 F (36.8 C) (Oral)   Resp 17   Ht 5\' 4"  (1.626 m)   Wt 214 lb 12.8 oz (97.4 kg)   BMI 36.87 kg/m   Body mass index is 36.87 kg/m.   Tobacco History  Smoking Status  . Former Smoker  . Packs/day: 0.25  . Types: Cigarettes  . Quit date: 12/06/2015  Smokeless Tobacco  . Never Used     Counseling given: Not Answered   Past Medical History:  Diagnosis Date  . Acute respiratory failure (HCC) 2015   twice  . Anxiety   . Chronic kidney disease   . COPD (chronic obstructive pulmonary disease) (HCC)   . Depression   . Diabetes mellitus without complication (HCC)   . Hyperlipidemia   . Hypertension   . Meniscus tear    right knee  . Obesity   . Restless leg syndrome   . Tobacco abuse    Past Surgical History:  Procedure Laterality Date  . CHOLECYSTECTOMY    . tumor removed     Family History  Problem Relation Age of Onset  . Alcohol abuse Mother   . Heart disease Mother   . Hypertension Mother   . Mental illness Mother   . Stroke Mother   . Heart disease Father   . Hyperlipidemia Father   . Drug abuse Sister   . Migraines Sister   . Drug abuse Brother   . Alcohol abuse Brother   . Heart disease Brother   . Hyperlipidemia Brother   . Bipolar disorder Daughter   . Anxiety disorder Daughter   . Bipolar disorder Son   . Alcohol abuse Son   . Alcohol abuse Brother   . Drug abuse Brother   . Drug abuse Brother   . Alcohol abuse Brother   . Asthma Brother   . Raynaud syndrome Brother   . Heart disease Maternal Grandmother   . Diabetes Paternal  Grandmother    History  Sexual Activity  . Sexual activity: No    Outpatient Encounter Prescriptions as of 07/22/2017  Medication Sig  . aspirin EC 81 MG tablet Take 81 mg by mouth daily.  Marland Kitchen. atenolol (TENORMIN) 50 MG tablet TAKE 1 TABLET BY MOUTH ONCE DAILY  . Calcium Carbonate-Vit D-Min (CALCIUM 1200 PO) Take by mouth.  . gabapentin (NEURONTIN) 100 MG capsule TAKE 2 CAPSULES BY MOUTH THREE TIMES A DAY.  . hydrochlorothiazide (HYDRODIURIL) 25 MG tablet TAKE 1 TABLET BY MOUTH ONCE DAILY.  Marland Kitchen. Lipoic Acid 150 MG CAPS Take by mouth.  . Multiple Vitamins-Minerals (MULTIVITAMIN GUMMIES ADULTS PO) Take by mouth.  . ONE TOUCH ULTRA TEST test strip TEST once daily as directed  . pravastatin (PRAVACHOL) 40 MG tablet TAKE 1 TABLET BY MOUTH AT BEDTIME.  Marland Kitchen. rOPINIRole (REQUIP) 0.25 MG tablet TAKE 1 TABLET BY MOUTH THREE TIMES DAILY  . theophylline (UNIPHYL) 400 MG 24 hr tablet TAKE 1 TABLET BY MOUTH ONCE DAILY  . Vitamin D, Cholecalciferol, 1000 units CAPS Take by mouth.  . [DISCONTINUED] albuterol (PROAIR HFA) 108 (90 Base) MCG/ACT inhaler inhale 2 puffs by mouth every 6 hours  if needed for wheezing and shortness of breath  . [DISCONTINUED] albuterol (PROVENTIL) (2.5 MG/3ML) 0.083% nebulizer solution Take 3 mLs (2.5 mg total) by nebulization every 6 (six) hours as needed for wheezing or shortness of breath.  . [DISCONTINUED] budesonide-formoterol (SYMBICORT) 160-4.5 MCG/ACT inhaler Inhale 2 puffs into the lungs 2 (two) times daily.  . [DISCONTINUED] clonazePAM (KLONOPIN) 1 MG tablet take 1 tablet by mouth once daily if needed  . [DISCONTINUED] losartan (COZAAR) 50 MG tablet take 1 tablet by mouth once daily  . [DISCONTINUED] sertraline (ZOLOFT) 100 MG tablet Take 1 tablet (100 mg total) by mouth daily.  . [DISCONTINUED] SPIRIVA HANDIHALER 18 MCG inhalation capsule inhale the contents of one capsule in the handihaler once daily  . [DISCONTINUED] varenicline (CHANTIX CONTINUING MONTH PAK) 1 MG tablet  Take 1 tablet (1 mg total) by mouth 2 (two) times daily. (Patient not taking: Reported on 07/22/2017)  . [DISCONTINUED] varenicline (CHANTIX STARTING MONTH PAK) 0.5 MG X 11 & 1 MG X 42 tablet Take one 0.5 mg tablet by mouth once daily for 3 days, then increase to one 0.5 mg tab BID for 4 days, then increase to one 1 mg tab BID. (Patient not taking: Reported on 07/22/2017)   No facility-administered encounter medications on file as of 07/22/2017.     Activities of Daily Living In your present state of health, do you have any difficulty performing the following activities: 07/22/2017 08/10/2016  Hearing? N N  Vision? N N  Difficulty concentrating or making decisions? N N  Walking or climbing stairs? Y Y  Dressing or bathing? N N  Doing errands, shopping? Malvin Johns  Preparing Food and eating ? N -  Using the Toilet? N -  In the past six months, have you accidently leaked urine? N -  Do you have problems with loss of bowel control? N -  Managing your Medications? N -  Managing your Finances? N -  Housekeeping or managing your Housekeeping? N -  Some recent data might be hidden    Patient Care Team: [name], DO as PCP - General (Family Medicine) [name], DO as Referring Physician (Family Medicine) Mertie Moores, MD as Referring Physician (Pulmonary Disease)    Assessment:     Exercise Activities and Dietary recommendations Current Exercise Habits: The patient does not participate in regular exercise at present, Exercise limited by: respiratory conditions(s)  Goals    . Increase water intake          Recommend drinking at least 5-6 glasses of water a day       Fall Risk Fall Risk  07/22/2017 04/06/2017 08/10/2016 08/10/2016  Falls in the past year? No No No No   Depression Screen PHQ 2/9 Scores 07/22/2017 04/06/2017 04/06/2017 12/29/2016  PHQ - 2 Score 3 3 3 3   PHQ- 9 Score 6 12 - 8     Cognitive Function     6CIT Screen 07/22/2017 08/10/2016  What Year? 0  points 0 points  What month? 0 points 0 points  What time? 0 points 0 points  Count back from 20 0 points 0 points  Months in reverse 0 points 0 points  Repeat phrase 0 points 0 points  Total Score 0 0    Immunization History  Administered Date(s) Administered  . Influenza, High Dose Seasonal PF 07/22/2017  . Influenza,inj,Quad PF,6+ Mos 06/11/2015, 08/10/2016  . Pneumococcal Polysaccharide-23 04/24/2009  . Tdap 10/09/2011   Screening Tests Health Maintenance  Topic Date  Due  . FOOT EXAM  01/20/2017  . OPHTHALMOLOGY EXAM  02/04/2017  . PNA vac Low Risk Adult (2 of 2 - PPSV23) 04/25/2017  . MAMMOGRAM  07/22/2018 (Originally 04/25/2002)  . PAP SMEAR  07/22/2018 (Originally 04/25/1973)  . DEXA SCAN  07/22/2018 (Originally 04/25/2017)  . COLONOSCOPY  07/22/2018 (Originally 04/25/2002)  . HIV Screening  07/22/2018 (Originally 04/26/1967)  . HEMOGLOBIN A1C  10/07/2017  . TETANUS/TDAP  10/08/2021  . INFLUENZA VACCINE  Completed  . Hepatitis C Screening  Addressed      Plan:     I have personally reviewed and addressed the Medicare Annual Wellness questionnaire and have noted the following in the patient's chart:  A. Medical and social history B. Use of alcohol, tobacco or illicit drugs  C. Current medications and supplements D. Functional ability and status E.  Nutritional status F.  Physical activity G. Advance directives H. List of other physicians I.  Hospitalizations, surgeries, and ER visits in previous 12 months J.  Vitals K. Screenings such as hearing and vision if needed, cognitive and depression L. Referrals and appointments   In addition, I have reviewed and discussed with patient certain preventive protocols, quality metrics, and best practice recommendations. A written personalized care plan for preventive services as well as general preventive health recommendations were provided to patient.   Signed,  Marin Roberts, LPN Nurse Health Advisor   MD  Recommendations: due for diabetic foot exam, declined pap smear, mammogram, colonoscopy, bone density.   Will fax request to [facility] for diabetic eye exam results.

## 2017-07-22 NOTE — Patient Instructions (Addendum)
[name] , Thank you for taking time to come for your Medicare Wellness Visit. I appreciate your ongoing commitment to your health goals. Please review the following plan we discussed and let me know if I can assist you in the future.   Screening recommendations/referrals: Colonoscopy: declined Mammogram: declined Bone Density:declined Recommended yearly ophthalmology/optometry visit for glaucoma screening and checkup Recommended yearly dental visit for hygiene and checkup  Vaccinations: Influenza vaccine: done today Pneumococcal vaccine:up to date Tdap vaccine: up to date Shingles vaccine: declined  Advanced directives: Please bring a copy of your health care power of attorney and living will to the office at your convenience.  Conditions/risks identified: Recommend drinking at least 5-6 glasses of water a day   Next appointment: Follow up in one year for your annual wellness exam.    Preventive Care 65 Years and Older, Female Preventive care refers to lifestyle choices and visits with your health care provider that can promote health and wellness. What does preventive care include?  A yearly physical exam. This is also called an annual well check.  Dental exams once or twice a year.  Routine eye exams. Ask your health care provider how often you should have your eyes checked.  Personal lifestyle choices, including:  Daily care of your teeth and gums.  Regular physical activity.  Eating a healthy diet.  Avoiding tobacco and drug use.  Limiting alcohol use.  Practicing safe sex.  Taking low-dose aspirin every day.  Taking vitamin and mineral supplements as recommended by your health care provider. What happens during an annual well check? The services and screenings done by your health care provider during your annual well check will depend on your age, overall health, lifestyle risk factors, and family history of disease. Counseling  Your health care provider may  ask you questions about your:  Alcohol use.  Tobacco use.  Drug use.  Emotional well-being.  Home and relationship well-being.  Sexual activity.  Eating habits.  History of falls.  Memory and ability to understand (cognition).  Work and work Astronomerenvironment.  Reproductive health. Screening  You may have the following tests or measurements:  Height, weight, and BMI.  Blood pressure.  Lipid and cholesterol levels. These may be checked every 5 years, or more frequently if you are over 65 years old.  Skin check.  Lung cancer screening. You may have this screening every year starting at age 65 if you have a 30-pack-year history of smoking and currently smoke or have quit within the past 15 years.  Fecal occult blood test (FOBT) of the stool. You may have this test every year starting at age 65.  Flexible sigmoidoscopy or colonoscopy. You may have a sigmoidoscopy every 5 years or a colonoscopy every 10 years starting at age 65.  Hepatitis C blood test.  Hepatitis B blood test.  Sexually transmitted disease (STD) testing.  Diabetes screening. This is done by checking your blood sugar (glucose) after you have not eaten for a while (fasting). You may have this done every 1-3 years.  Bone density scan. This is done to screen for osteoporosis. You may have this done starting at age 65.  Mammogram. This may be done every 1-2 years. Talk to your health care provider about how often you should have regular mammograms. Talk with your health care provider about your test results, treatment options, and if necessary, the need for more tests. Vaccines  Your health care provider may recommend certain vaccines, such as:  Influenza vaccine.  This is recommended every year.  Tetanus, diphtheria, and acellular pertussis (Tdap, Td) vaccine. You may need a Td booster every 10 years.  Zoster vaccine. You may need this after age 62.  Pneumococcal 13-valent conjugate (PCV13) vaccine. One  dose is recommended after age 22.  Pneumococcal polysaccharide (PPSV23) vaccine. One dose is recommended after age 36. Talk to your health care provider about which screenings and vaccines you need and how often you need them. This information is not intended to replace advice given to you by your health care provider. Make sure you discuss any questions you have with your health care provider. Document Released: 10/11/2015 Document Revised: 06/03/2016 Document Reviewed: 07/16/2015 Elsevier Interactive Patient Education  2017 [employer] Prevention in the Home Falls can cause injuries. They can happen to people of all ages. There are many things you can do to make your home safe and to help prevent falls. What can I do on the outside of my home?  Regularly fix the edges of walkways and driveways and fix any cracks.  Remove anything that might make you trip as you walk through a door, such as a raised step or threshold.  Trim any bushes or trees on the path to your home.  Use bright outdoor lighting.  Clear any walking paths of anything that might make someone trip, such as rocks or tools.  Regularly check to see if handrails are loose or broken. Make sure that both sides of any steps have handrails.  Any raised decks and porches should have guardrails on the edges.  Have any leaves, snow, or ice cleared regularly.  Use sand or salt on walking paths during winter.  Clean up any spills in your garage right away. This includes oil or grease spills. What can I do in the bathroom?  Use night lights.  Install grab bars by the toilet and in the tub and shower. Do not use towel bars as grab bars.  Use non-skid mats or decals in the tub or shower.  If you need to sit down in the shower, use a plastic, non-slip stool.  Keep the floor dry. Clean up any water that spills on the floor as soon as it happens.  Remove soap buildup in the tub or shower regularly.  Attach bath  mats securely with double-sided non-slip rug tape.  Do not have throw rugs and other things on the floor that can make you trip. What can I do in the bedroom?  Use night lights.  Make sure that you have a light by your bed that is easy to reach.  Do not use any sheets or blankets that are too big for your bed. They should not hang down onto the floor.  Have a firm chair that has side arms. You can use this for support while you get dressed.  Do not have throw rugs and other things on the floor that can make you trip. What can I do in the kitchen?  Clean up any spills right away.  Avoid walking on wet floors.  Keep items that you use a lot in easy-to-reach places.  If you need to reach something above you, use a strong step stool that has a grab bar.  Keep electrical cords out of the way.  Do not use floor polish or wax that makes floors slippery. If you must use wax, use non-skid floor wax.  Do not have throw rugs and other things on the floor that can make  you trip. What can I do with my stairs?  Do not leave any items on the stairs.  Make sure that there are handrails on both sides of the stairs and use them. Fix handrails that are broken or loose. Make sure that handrails are as long as the stairways.  Check any carpeting to make sure that it is firmly attached to the stairs. Fix any carpet that is loose or worn.  Avoid having throw rugs at the top or bottom of the stairs. If you do have throw rugs, attach them to the floor with carpet tape.  Make sure that you have a light switch at the top of the stairs and the bottom of the stairs. If you do not have them, ask someone to add them for you. What else can I do to help prevent falls?  Wear shoes that:  Do not have high heels.  Have rubber bottoms.  Are comfortable and fit you well.  Are closed at the toe. Do not wear sandals.  If you use a stepladder:  Make sure that it is fully opened. Do not climb a closed  stepladder.  Make sure that both sides of the stepladder are locked into place.  Ask someone to hold it for you, if possible.  Clearly mark and make sure that you can see:  Any grab bars or handrails.  First and last steps.  Where the edge of each step is.  Use tools that help you move around (mobility aids) if they are needed. These include:  Canes.  Walkers.  Scooters.  Crutches.  Turn on the lights when you go into a dark area. Replace any light bulbs as soon as they burn out.  Set up your furniture so you have a clear path. Avoid moving your furniture around.  If any of your floors are uneven, fix them.  If there are any pets around you, be aware of where they are.  Review your medicines with your doctor. Some medicines can make you feel dizzy. This can increase your chance of falling. Ask your doctor what other things that you can do to help prevent falls. This information is not intended to replace advice given to you by your health care provider. Make sure you discuss any questions you have with your health care provider. Document Released: 07/11/2009 Document Revised: 02/20/2016 Document Reviewed: 10/19/2014 Elsevier Interactive Patient Education  2017 [employer].

## 2017-07-22 NOTE — Assessment & Plan Note (Signed)
Under good control. Continue current regimen. Call with any concerns.  

## 2017-07-22 NOTE — Assessment & Plan Note (Signed)
Slightly exacerbated given her living situation right now. Continue current regimen. Continue to monitor. Call with any concerns.

## 2017-07-22 NOTE — Assessment & Plan Note (Signed)
Stable. Lungs clear. Call with any concerns.  

## 2017-07-22 NOTE — Assessment & Plan Note (Signed)
Under good control with A1c of 5.9. Continue diet and exercise. Call with any concerns.  

## 2017-07-23 ENCOUNTER — Telehealth: Payer: Self-pay

## 2017-07-23 NOTE — Telephone Encounter (Signed)
Faxed request to [facility] for diabetic eye exam results.   Fax received back stating patient has not had diabetic eye exam. Paperwork placed to be scanned

## 2017-08-03 ENCOUNTER — Telehealth: Payer: Self-pay | Admitting: Family Medicine

## 2017-08-03 NOTE — Telephone Encounter (Signed)
Copied from CRM #[id number]. Topic: General - Other >> Aug 03, 2017 11:26 AM [name], Princella PellegriniJessica D wrote: Reason for CRM: Patient states that she thinks she has bronchitis and would like Tiffany to give her a call.

## 2017-08-04 MED ORDER — PREDNISONE 10 MG PO TABS: ORAL_TABLET | ORAL | 0 refills | Status: DC

## 2017-08-04 NOTE — Telephone Encounter (Signed)
Patient notified

## 2017-08-04 NOTE — Telephone Encounter (Signed)
Copied from CRM #[id number]. Topic: Quick Communication - See Telephone Encounter >> Aug 04, 2017  1:22 PM Viviann SpareWhite, Selina wrote: CRM for notification. See Telephone encounter for: 08/04/17. Pt called and stated that her RX for her Prednisone was sent to the [facility] in [place]. Pt need the RX to go to the [facility] on [address] in [place]. Please call pt when the RX has been send to the correct phamacy.

## 2017-08-04 NOTE — Telephone Encounter (Signed)
I've sent prednisone over to her pharmacy. Please let her know that if she's not getting better with this, we should see her.

## 2017-08-04 NOTE — Telephone Encounter (Signed)
Routing to provider  

## 2017-08-05 ENCOUNTER — Other Ambulatory Visit: Payer: Self-pay | Admitting: Family Medicine

## 2017-08-05 MED ORDER — PREDNISONE 10 MG PO TABS: ORAL_TABLET | ORAL | 0 refills | Status: DC

## 2017-08-05 NOTE — Telephone Encounter (Signed)
Rxs sent to her pharmacy 

## 2017-08-19 ENCOUNTER — Other Ambulatory Visit: Payer: Self-pay | Admitting: Family Medicine

## 2017-08-19 DIAGNOSIS — E782 Mixed hyperlipidemia: Secondary | ICD-10-CM

## 2017-08-19 DIAGNOSIS — J449 Chronic obstructive pulmonary disease, unspecified: Secondary | ICD-10-CM | POA: Diagnosis not present

## 2017-08-24 ENCOUNTER — Other Ambulatory Visit: Payer: Self-pay | Admitting: Family Medicine

## 2017-08-24 DIAGNOSIS — F419 Anxiety disorder, unspecified: Secondary | ICD-10-CM

## 2017-09-09 ENCOUNTER — Other Ambulatory Visit: Payer: Self-pay | Admitting: Family Medicine

## 2017-09-09 DIAGNOSIS — E782 Mixed hyperlipidemia: Secondary | ICD-10-CM

## 2017-09-09 DIAGNOSIS — I129 Hypertensive chronic kidney disease with stage 1 through stage 4 chronic kidney disease, or unspecified chronic kidney disease: Secondary | ICD-10-CM

## 2017-09-10 ENCOUNTER — Other Ambulatory Visit: Payer: Self-pay | Admitting: Family Medicine

## 2017-09-18 DIAGNOSIS — J449 Chronic obstructive pulmonary disease, unspecified: Secondary | ICD-10-CM | POA: Diagnosis not present

## 2017-10-12 ENCOUNTER — Other Ambulatory Visit: Payer: Self-pay | Admitting: Family Medicine

## 2017-11-03 ENCOUNTER — Other Ambulatory Visit: Payer: Self-pay | Admitting: Family Medicine

## 2017-11-03 DIAGNOSIS — F419 Anxiety disorder, unspecified: Secondary | ICD-10-CM

## 2017-11-04 ENCOUNTER — Other Ambulatory Visit: Payer: Self-pay | Admitting: Family Medicine

## 2017-11-04 DIAGNOSIS — F419 Anxiety disorder, unspecified: Secondary | ICD-10-CM

## 2017-11-04 MED ORDER — GABAPENTIN 100 MG PO CAPS: 200.0000 mg | ORAL_CAPSULE | Freq: Four times a day (QID) | ORAL | 3 refills | Status: DC

## 2017-11-04 NOTE — Telephone Encounter (Signed)
Medication called into [facility] [place] and left on prescriber voicemail.

## 2017-11-04 NOTE — Telephone Encounter (Signed)
OK to call in clonazepam

## 2017-11-04 NOTE — Telephone Encounter (Signed)
Klonopin refill request Last OV 07/22/17 [id number], [place]

## 2017-11-04 NOTE — Telephone Encounter (Signed)
Copied from CRM [id number]. Topic: Quick Communication - See Telephone Encounter >> Nov 04, 2017  1:34 PM Diana EvesHoyt, Maryann B wrote: CRM for notification. See Telephone encounter for:  Pt needing a new script for gabapentin because she is taking 2 capsules 4x a day.

## 2017-11-22 ENCOUNTER — Ambulatory Visit (INDEPENDENT_AMBULATORY_CARE_PROVIDER_SITE_OTHER): Payer: Medicare HMO | Admitting: Family Medicine

## 2017-11-22 ENCOUNTER — Encounter: Payer: Self-pay | Admitting: Family Medicine

## 2017-11-22 VITALS — BP 119/77 | HR 67 | Temp 98.0°F

## 2017-11-22 DIAGNOSIS — E782 Mixed hyperlipidemia: Secondary | ICD-10-CM | POA: Diagnosis not present

## 2017-11-22 DIAGNOSIS — N182 Chronic kidney disease, stage 2 (mild): Secondary | ICD-10-CM | POA: Diagnosis not present

## 2017-11-22 DIAGNOSIS — K439 Ventral hernia without obstruction or gangrene: Secondary | ICD-10-CM | POA: Diagnosis not present

## 2017-11-22 DIAGNOSIS — G2581 Restless legs syndrome: Secondary | ICD-10-CM

## 2017-11-22 DIAGNOSIS — I129 Hypertensive chronic kidney disease with stage 1 through stage 4 chronic kidney disease, or unspecified chronic kidney disease: Secondary | ICD-10-CM

## 2017-11-22 DIAGNOSIS — Z23 Encounter for immunization: Secondary | ICD-10-CM

## 2017-11-22 DIAGNOSIS — E119 Type 2 diabetes mellitus without complications: Secondary | ICD-10-CM | POA: Diagnosis not present

## 2017-11-22 DIAGNOSIS — Z0001 Encounter for general adult medical examination with abnormal findings: Secondary | ICD-10-CM

## 2017-11-22 DIAGNOSIS — J449 Chronic obstructive pulmonary disease, unspecified: Secondary | ICD-10-CM | POA: Diagnosis not present

## 2017-11-22 DIAGNOSIS — Z0000 Encounter for general adult medical examination without abnormal findings: Secondary | ICD-10-CM

## 2017-11-22 DIAGNOSIS — F419 Anxiety disorder, unspecified: Secondary | ICD-10-CM

## 2017-11-22 LAB — MICROSCOPIC EXAMINATION

## 2017-11-22 LAB — MICROALBUMIN, URINE WAIVED
Creatinine, Urine Waived: 200 mg/dL (ref 10–300)
Microalb, Ur Waived: 80 mg/L — ABNORMAL HIGH (ref 0–19)

## 2017-11-22 LAB — UA/M W/RFLX CULTURE, ROUTINE
Bilirubin, UA: NEGATIVE
Glucose, UA: NEGATIVE
Ketones, UA: NEGATIVE
Nitrite, UA: NEGATIVE
Specific Gravity, UA: 1.025 (ref 1.005–1.030)
Urobilinogen, Ur: 0.2 mg/dL (ref 0.2–1.0)
pH, UA: 6 (ref 5.0–7.5)

## 2017-11-22 LAB — BAYER DCA HB A1C WAIVED: HB A1C (BAYER DCA - WAIVED): 5.8 % (ref <7.0)

## 2017-11-22 MED ORDER — PRAVASTATIN SODIUM 40 MG PO TABS: 40.0000 mg | ORAL_TABLET | Freq: Every day | ORAL | 1 refills | Status: DC

## 2017-11-22 MED ORDER — TIOTROPIUM BROMIDE MONOHYDRATE 18 MCG IN CAPS: ORAL_CAPSULE | RESPIRATORY_TRACT | 1 refills | Status: DC

## 2017-11-22 MED ORDER — THEOPHYLLINE ER 400 MG PO TB24: 400.0000 mg | ORAL_TABLET | Freq: Every day | ORAL | 1 refills | Status: DC

## 2017-11-22 MED ORDER — LOSARTAN POTASSIUM 25 MG PO TABS: 25.0000 mg | ORAL_TABLET | Freq: Every day | ORAL | 1 refills | Status: DC

## 2017-11-22 MED ORDER — ALBUTEROL SULFATE HFA 108 (90 BASE) MCG/ACT IN AERS: INHALATION_SPRAY | RESPIRATORY_TRACT | 12 refills | Status: DC

## 2017-11-22 MED ORDER — ROPINIROLE HCL 0.5 MG PO TABS: 0.5000 mg | ORAL_TABLET | Freq: Three times a day (TID) | ORAL | 1 refills | Status: DC

## 2017-11-22 MED ORDER — GABAPENTIN 100 MG PO CAPS: 200.0000 mg | ORAL_CAPSULE | Freq: Four times a day (QID) | ORAL | 3 refills | Status: DC

## 2017-11-22 MED ORDER — ATENOLOL 50 MG PO TABS: 50.0000 mg | ORAL_TABLET | Freq: Every day | ORAL | 1 refills | Status: DC

## 2017-11-22 MED ORDER — SERTRALINE HCL 100 MG PO TABS: 150.0000 mg | ORAL_TABLET | Freq: Every day | ORAL | 1 refills | Status: DC

## 2017-11-22 NOTE — Assessment & Plan Note (Signed)
Not under good control. Increase sertraline to 150mg  and recheck 1 month. Call with any concerns.

## 2017-11-22 NOTE — Assessment & Plan Note (Signed)
Over treated, has been going low. Will cut losartan in 1/2 and recheck 1 month. Call with any concerns.

## 2017-11-22 NOTE — Assessment & Plan Note (Signed)
Rechecking levels today. Tolerating medicine well. Call with any concerns.  

## 2017-11-22 NOTE — Assessment & Plan Note (Signed)
Stable. Continue current regimen. Continue to monitor. Call with any concerns.  

## 2017-11-22 NOTE — Patient Instructions (Addendum)
Health Maintenance, Female Adopting a healthy lifestyle and getting preventive care can go a long way to promote health and wellness. Talk with your health care provider about what schedule of regular examinations is right for you. This is a good chance for you to check in with your provider about disease prevention and staying healthy. In between checkups, there are plenty of things you can do on your own. Experts have done a lot of research about which lifestyle changes and preventive measures are most likely to keep you healthy. Ask your health care provider for more information. Weight and diet Eat a healthy diet  Be sure to include plenty of vegetables, fruits, low-fat dairy products, and lean protein.  Do not eat a lot of foods high in solid fats, added sugars, or salt.  Get regular exercise. This is one of the most important things you can do for your health. ? Most adults should exercise for at least 150 minutes each week. The exercise should increase your heart rate and make you sweat (moderate-intensity exercise). ? Most adults should also do strengthening exercises at least twice a week. This is in addition to the moderate-intensity exercise.  Maintain a healthy weight  Body mass index (BMI) is a measurement that can be used to identify possible weight problems. It estimates body fat based on height and weight. Your health care provider can help determine your BMI and help you achieve or maintain a healthy weight.  For females 20 years of age and older: ? A BMI below 18.5 is considered underweight. ? A BMI of 18.5 to 24.9 is normal. ? A BMI of 25 to 29.9 is considered overweight. ? A BMI of 30 and above is considered obese.  Watch levels of cholesterol and blood lipids  You should start having your blood tested for lipids and cholesterol at 66 years of age, then have this test every 5 years.  You may need to have your cholesterol levels checked more often if: ? Your lipid or  cholesterol levels are high. ? You are older than 66 years of age. ? You are at high risk for heart disease.  Cancer screening Lung Cancer  Lung cancer screening is recommended for adults 55-80 years old who are at high risk for lung cancer because of a history of smoking.  A yearly low-dose CT scan of the lungs is recommended for people who: ? Currently smoke. ? Have quit within the past 15 years. ? Have at least a 30-pack-year history of smoking. A pack year is smoking an average of one pack of cigarettes a day for 1 year.  Yearly screening should continue until it has been 15 years since you quit.  Yearly screening should stop if you develop a health problem that would prevent you from having lung cancer treatment.  Breast Cancer  Practice breast self-awareness. This means understanding how your breasts normally appear and feel.  It also means doing regular breast self-exams. Let your health care provider know about any changes, no matter how small.  If you are in your 20s or 30s, you should have a clinical breast exam (CBE) by a health care provider every 1-3 years as part of a regular health exam.  If you are 40 or older, have a CBE every year. Also consider having a breast X-ray (mammogram) every year.  If you have a family history of breast cancer, talk to your health care provider about genetic screening.  If you are at high risk   for breast cancer, talk to your health care provider about having an MRI and a mammogram every year.  Breast cancer gene (BRCA) assessment is recommended for women who have family members with BRCA-related cancers. BRCA-related cancers include: ? Breast. ? Ovarian. ? Tubal. ? Peritoneal cancers.  Results of the assessment will determine the need for genetic counseling and BRCA1 and BRCA2 testing.  Cervical Cancer Your health care provider may recommend that you be screened regularly for cancer of the pelvic organs (ovaries, uterus, and  vagina). This screening involves a pelvic examination, including checking for microscopic changes to the surface of your cervix (Pap test). You may be encouraged to have this screening done every 3 years, beginning at age 22.  For women ages 56-65, health care providers may recommend pelvic exams and Pap testing every 3 years, or they may recommend the Pap and pelvic exam, combined with testing for human papilloma virus (HPV), every 5 years. Some types of HPV increase your risk of cervical cancer. Testing for HPV may also be done on women of any age with unclear Pap test results.  Other health care providers may not recommend any screening for nonpregnant women who are considered low risk for pelvic cancer and who do not have symptoms. Ask your health care provider if a screening pelvic exam is right for you.  If you have had past treatment for cervical cancer or a condition that could lead to cancer, you need Pap tests and screening for cancer for at least 20 years after your treatment. If Pap tests have been discontinued, your risk factors (such as having a new sexual partner) need to be reassessed to determine if screening should resume. Some women have medical problems that increase the chance of getting cervical cancer. In these cases, your health care provider may recommend more frequent screening and Pap tests.  Colorectal Cancer  This type of cancer can be detected and often prevented.  Routine colorectal cancer screening usually begins at 66 years of age and continues through 66 years of age.  Your health care provider may recommend screening at an earlier age if you have risk factors for colon cancer.  Your health care provider may also recommend using home test kits to check for hidden blood in the stool.  A small camera at the end of a tube can be used to examine your colon directly (sigmoidoscopy or colonoscopy). This is done to check for the earliest forms of colorectal  cancer.  Routine screening usually begins at age 33.  Direct examination of the colon should be repeated every 5-10 years through 66 years of age. However, you may need to be screened more often if early forms of precancerous polyps or small growths are found.  Skin Cancer  Check your skin from head to toe regularly.  Tell your health care provider about any new moles or changes in moles, especially if there is a change in a mole's shape or color.  Also tell your health care provider if you have a mole that is larger than the size of a pencil eraser.  Always use sunscreen. Apply sunscreen liberally and repeatedly throughout the day.  Protect yourself by wearing long sleeves, pants, a wide-brimmed hat, and sunglasses whenever you are outside.  Heart disease, diabetes, and high blood pressure  High blood pressure causes heart disease and increases the risk of stroke. High blood pressure is more likely to develop in: ? People who have blood pressure in the high end of  the normal range (130-139/85-89 mm Hg). ? People who are overweight or obese. ? People who are African American.  If you are 21-29 years of age, have your blood pressure checked every 3-5 years. If you are 3 years of age or older, have your blood pressure checked every year. You should have your blood pressure measured twice-once when you are at a hospital or clinic, and once when you are not at a hospital or clinic. Record the average of the two measurements. To check your blood pressure when you are not at a hospital or clinic, you can use: ? An automated blood pressure machine at a pharmacy. ? A home blood pressure monitor.  If you are between 17 years and 37 years old, ask your health care provider if you should take aspirin to prevent strokes.  Have regular diabetes screenings. This involves taking a blood sample to check your fasting blood sugar level. ? If you are at a normal weight and have a low risk for diabetes,  have this test once every three years after 66 years of age. ? If you are overweight and have a high risk for diabetes, consider being tested at a younger age or more often. Preventing infection Hepatitis B  If you have a higher risk for hepatitis B, you should be screened for this virus. You are considered at high risk for hepatitis B if: ? You were born in a country where hepatitis B is common. Ask your health care provider which countries are considered high risk. ? Your parents were born in a high-risk country, and you have not been immunized against hepatitis B (hepatitis B vaccine). ? You have HIV or AIDS. ? You use needles to inject street drugs. ? You live with someone who has hepatitis B. ? You have had sex with someone who has hepatitis B. ? You get hemodialysis treatment. ? You take certain medicines for conditions, including cancer, organ transplantation, and autoimmune conditions.  Hepatitis C  Blood testing is recommended for: ? Everyone born from [birth year] through 1964. ? Anyone with known risk factors for hepatitis C.  Sexually transmitted infections (STIs)  You should be screened for sexually transmitted infections (STIs) including gonorrhea and chlamydia if: ? You are sexually active and are younger than 66 years of age. ? You are older than 66 years of age and your health care provider tells you that you are at risk for this type of infection. ? Your sexual activity has changed since you were last screened and you are at an increased risk for chlamydia or gonorrhea. Ask your health care provider if you are at risk.  If you do not have HIV, but are at risk, it may be recommended that you take a prescription medicine daily to prevent HIV infection. This is called pre-exposure prophylaxis (PrEP). You are considered at risk if: ? You are sexually active and do not regularly use condoms or know the HIV status of your partner(s). ? You take drugs by injection. ? You are  sexually active with a partner who has HIV.  Talk with your health care provider about whether you are at high risk of being infected with HIV. If you choose to begin PrEP, you should first be tested for HIV. You should then be tested every 3 months for as long as you are taking PrEP. Pregnancy  If you are premenopausal and you may become pregnant, ask your health care provider about preconception counseling.  If you may become  pregnant, take 400 to 800 micrograms (mcg) of folic acid every day.  If you want to prevent pregnancy, talk to your health care provider about birth control (contraception). Osteoporosis and menopause  Osteoporosis is a disease in which the bones lose minerals and strength with aging. This can result in serious bone fractures. Your risk for osteoporosis can be identified using a bone density scan.  If you are 50 years of age or older, or if you are at risk for osteoporosis and fractures, ask your health care provider if you should be screened.  Ask your health care provider whether you should take a calcium or vitamin D supplement to lower your risk for osteoporosis.  Menopause may have certain physical symptoms and risks.  Hormone replacement therapy may reduce some of these symptoms and risks. Talk to your health care provider about whether hormone replacement therapy is right for you. Follow these instructions at home:  Schedule regular health, dental, and eye exams.  Stay current with your immunizations.  Do not use any tobacco products including cigarettes, chewing tobacco, or electronic cigarettes.  If you are pregnant, do not drink alcohol.  If you are breastfeeding, limit how much and how often you drink alcohol.  Limit alcohol intake to no more than 1 drink per day for nonpregnant women. One drink equals 12 ounces of beer, 5 ounces of wine, or 1 ounces of hard liquor.  Do not use street drugs.  Do not share needles.  Ask your health care  provider for help if you need support or information about quitting drugs.  Tell your health care provider if you often feel depressed.  Tell your health care provider if you have ever been abused or do not feel safe at home. This information is not intended to replace advice given to you by your health care provider. Make sure you discuss any questions you have with your health care provider. Document Released: 03/30/2011 Document Revised: 02/20/2016 Document Reviewed: 06/18/2015 Elsevier Interactive Patient Education  2018 [place]. Pneumococcal Conjugate Vaccine (PCV13) What You Need to Know 1. Why get vaccinated? Vaccination can protect both children and adults from pneumococcal disease. Pneumococcal disease is caused by bacteria that can spread from person to person through close contact. It can cause ear infections, and it can also lead to more serious infections of the:  Lungs (pneumonia),  Blood (bacteremia), and  Covering of the brain and spinal cord (meningitis).  Pneumococcal pneumonia is most common among adults. Pneumococcal meningitis can cause deafness and brain damage, and it kills about 1 child in 10 who get it. Anyone can get pneumococcal disease, but children under 56 years of age and adults 92 years and older, people with certain medical conditions, and cigarette smokers are at the highest risk. Before there was a vaccine, the [place] saw:  more than 700 cases of meningitis,  about 13,000 blood infections,  about 5 million ear infections, and  about 200 deaths  in children under 5 each year from pneumococcal disease. Since vaccine became available, severe pneumococcal disease in these children has fallen by 88%. About 18,000 older adults die of pneumococcal disease each year in the [place]. Treatment of pneumococcal infections with penicillin and other drugs is not as effective as it used to be, because some strains of the disease have become  resistant to these drugs. This makes prevention of the disease, through vaccination, even more important. 2. PCV13 vaccine Pneumococcal conjugate vaccine (called PCV13) protects against 13 types  of pneumococcal bacteria. PCV13 is routinely given to children at 2, 4, 6, and 38-51 months of age. It is also recommended for children and adults 24 to 77 years of age with certain health conditions, and for all adults 26 years of age and older. Your doctor can give you details. 3. Some people should not get this vaccine Anyone who has ever had a life-threatening allergic reaction to a dose of this vaccine, to an earlier pneumococcal vaccine called PCV7, or to any vaccine containing diphtheria toxoid (for example, DTaP), should not get PCV13. Anyone with a severe allergy to any component of PCV13 should not get the vaccine. Tell your doctor if the person being vaccinated has any severe allergies. If the person scheduled for vaccination is not feeling well, your healthcare provider might decide to reschedule the shot on another day. 4. Risks of a vaccine reaction With any medicine, including vaccines, there is a chance of reactions. These are usually mild and go away on their own, but serious reactions are also possible. Problems reported following PCV13 varied by age and dose in the series. The most common problems reported among children were:  About half became drowsy after the shot, had a temporary loss of appetite, or had redness or tenderness where the shot was given.  About 1 out of 3 had swelling where the shot was given.  About 1 out of 3 had a mild fever, and about 1 in 20 had a fever over 102.54F.  Up to about 8 out of 10 became fussy or irritable.  Adults have reported pain, redness, and swelling where the shot was given; also mild fever, fatigue, headache, chills, or muscle pain. Young children who get PCV13 along with inactivated flu vaccine at the same time may be at increased risk for  seizures caused by fever. Ask your doctor for more information. Problems that could happen after any vaccine:  People sometimes faint after a medical procedure, including vaccination. Sitting or lying down for about 15 minutes can help prevent fainting, and injuries caused by a fall. Tell your doctor if you feel dizzy, or have vision changes or ringing in the ears.  Some older children and adults get severe pain in the shoulder and have difficulty moving the arm where a shot was given. This happens very rarely.  Any medication can cause a severe allergic reaction. Such reactions from a vaccine are very rare, estimated at about 1 in a million doses, and would happen within a few minutes to a few hours after the vaccination. As with any medicine, there is a very small chance of a vaccine causing a serious injury or death. The safety of vaccines is always being monitored. For more information, visit: http://www.aguilar.org/ 5. What if there is a serious reaction? What should I look for? Look for anything that concerns you, such as signs of a severe allergic reaction, very high fever, or unusual behavior. Signs of a severe allergic reaction can include hives, swelling of the face and throat, difficulty breathing, a fast heartbeat, dizziness, and weakness-usually within a few minutes to a few hours after the vaccination. What should I do?  If you think it is a severe allergic reaction or other emergency that can't wait, call 9-1-1 or get the person to the nearest hospital. Otherwise, call your doctor.  Reactions should be reported to the Vaccine Adverse Event Reporting System (VAERS). Your doctor should file this report, or you can do it yourself through the VAERS web site  at www.vaers.SamedayNews.es, or by calling 812 636 5000. ? VAERS does not give medical advice. 6. The National Vaccine Injury Compensation Program The [employer] Vaccine Injury Compensation Program (VICP) is a federal program that was  created to compensate people who may have been injured by certain vaccines. Persons who believe they may have been injured by a vaccine can learn about the program and about filing a claim by calling 562-222-6920 or visiting the [employer] website at GoldCloset.com.ee. There is a time limit to file a claim for compensation. 7. How can I learn more?  Ask your healthcare provider. He or she can give you the vaccine package insert or suggest other sources of information.  Call your local or state health department.  Contact the Centers for Disease Control and Prevention (CDC): ? Call 5808329633 (1-800-CDC-INFO) or ? Visit CDC's website at http://hunter.com/ Vaccine Information Statement, PCV13 Vaccine (08/02/2014) This information is not intended to replace advice given to you by your health care provider. Make sure you discuss any questions you have with your health care provider. Document Released: 07/12/2006 Document Revised: 06/04/2016 Document Reviewed: 06/04/2016 Elsevier Interactive Patient Education  2017 [employer].

## 2017-11-22 NOTE — Assessment & Plan Note (Signed)
Under good control with A1c of 5.8. Continue current regimen. Continue to monitor. Call with any concerns.  

## 2017-11-22 NOTE — Progress Notes (Signed)
BP 119/77 (BP Location: Left Arm, Patient Position: Sitting, Cuff Size: Large)   Pulse 67   Temp 98 F (36.7 C)   SpO2 94%    Subjective:    Patient ID: Lindsey KnudsenAnn Marie C Hopkins, female    DOB: 1951/01/03, 67 y.o.   MRN: [id number]  HPI: Lindsey Knudsennn Marie C Martucci is a 67 y.o. female presenting on 11/22/2017 for comprehensive medical examination. Current medical complaints include:  HYPERTENSION / HYPERLIPIDEMIA Satisfied with current treatment? No- BP has been running really low Duration of hypertension: chronic BP monitoring frequency: not checking BP medication side effects: no Past BP meds: atenolol, losartan, hctz Duration of hyperlipidemia: chronic Cholesterol medication side effects: no Cholesterol supplements: none Past cholesterol medications: pravastatin Medication compliance: excellent compliance Aspirin: yes Recent stressors: yes Recurrent headaches: no Visual changes: no Palpitations: no Dyspnea: no Chest pain: no Lower extremity edema: no Dizzy/lightheaded: no  ANXIETY/STRESS- has had a really stressful home life. She notes that her room mate is tough to live with and  Duration: exacerbated Anxious mood: yes  Excessive worrying: yes Irritability: yes  Sweating: no Nausea: no Palpitations:no Hyperventilation: no Panic attacks: no Agoraphobia: no  Obscessions/compulsions: yes Depressed mood: yes [facility]2017 07/22/2017 04/06/2017 04/06/2017 12/29/2016  Decreased Interest 2 0 1 1 1   Down, Depressed, Hopeless 3 3 2 2 2   PHQ - 2 Score 5 3 3 3 3   Altered sleeping 3 0 2 - 2  Tired, decreased energy 3 3 2  - 3  Change in appetite 3 0 2 - 0  Feeling bad or failure about yourself  3 0 1 - 0  Trouble concentrating 3 0 1 - 0  Moving slowly or fidgety/restless 0 0 1 - 0  Suicidal thoughts 1 0 0 - 0  PHQ-9 Score 21 6 12  - 8  Difficult doing work/chores Somewhat difficult Somewhat difficult - - Somewhat difficult   Anhedonia: no Weight changes:  no Insomnia: no   Hypersomnia: no Fatigue/loss of energy: no Feelings of worthlessness: no Feelings of guilt: no Impaired concentration/indecisiveness: no Suicidal ideations: no  Crying spells: no Recent Stressors/Life Changes: yes  DIABETES Hypoglycemic episodes:no Polydipsia/polyuria: no Visual disturbance: no Chest pain: no Paresthesias: no Glucose Monitoring: no Taking Insulin?: no Blood Pressure Monitoring: not monitoring Retinal Examination: Not up to Date Foot Exam: up to date Diabetic Education: completed Pneumovax: up to date Influenza: up to date Aspirin: yes  RESTLESS LEGS- has been worse over the past couple of months Duration: chronic Discomfort description:  Creepy, crawling, numb and tingling Pain: yes Location: calves Bilateral: yes Symmetric:yes  Severity: moderate Onset:  gradual Frequency:  Almost nightly Symptoms only occur while legs at rest: yes Sudden unintentional leg jerking: no Bed partner bothered by leg movements: no LE numbness: yes  Decreased sensation: no Weakness: no Insomnia: no Daytime somnolence: yes Fatigue: yes Alleviating factors: klonopin  Status: stable  COPD COPD status: stable Satisfied with current treatment?: yes Oxygen use: yes Dyspnea frequency: daily  Cough frequency: daily Rescue inhaler frequency: daily   Limitation of activity: yes Productive cough: no Pneumovax: up to date Influenza: up to date  She currently lives with: children she is caring for and room mate Menopausal Symptoms: no  Depression Screen done today and results listed below:  [facility] 11/22/2017 07/22/2017 04/06/2017 04/06/2017 12/29/2016  Decreased Interest 2 0 1 1 1   Down, Depressed, Hopeless 3 3 2 2 2   PHQ - 2 Score 5 3 3 3  3  Altered sleeping 3 0 2 - 2  Tired, decreased energy 3 3 2  - 3  Change in appetite 3 0 2 - 0  Feeling bad or failure about yourself  3 0 1 - 0  Trouble concentrating 3 0 1 - 0  Moving slowly  or fidgety/restless 0 0 1 - 0  Suicidal thoughts 1 0 0 - 0  PHQ-9 Score 21 6 12  - 8  Difficult doing work/chores Somewhat difficult Somewhat difficult - - Somewhat difficult     Past Medical History:  Past Medical History:  Diagnosis Date  . Acute respiratory failure (HCC) 2015   twice  . Anxiety   . Chronic kidney disease   . COPD (chronic obstructive pulmonary disease) (HCC)   . Depression   . Diabetes mellitus without complication (HCC)   . Hyperlipidemia   . Hypertension   . Meniscus tear    right knee  . Obesity   . Restless leg syndrome   . Tobacco abuse     Surgical History:  Past Surgical History:  Procedure Laterality Date  . CHOLECYSTECTOMY    . tumor removed      Medications:  Current Outpatient Medications on File Prior to Visit  Medication Sig  . albuterol (PROVENTIL) (2.5 MG/3ML) 0.083% nebulizer solution Take 3 mLs (2.5 mg total) by nebulization every 6 (six) hours as needed for wheezing or shortness of breath.  Marland Kitchen aspirin EC 81 MG tablet Take 81 mg by mouth daily.  . budesonide-formoterol (SYMBICORT) 160-4.5 MCG/ACT inhaler Inhale 2 puffs into the lungs 2 (two) times daily.  . Calcium Carbonate-Vit D-Min (CALCIUM 1200 PO) Take by mouth.  . clonazePAM (KLONOPIN) 1 MG tablet TAKE 1 TABLET BY MOUTH ONCE DAILY IF NEEDED  . hydrochlorothiazide (HYDRODIURIL) 25 MG tablet TAKE 1 TABLET BY MOUTH EVERY DAY  . Lipoic Acid 150 MG CAPS Take by mouth.  . Multiple Vitamins-Minerals (MULTIVITAMIN GUMMIES ADULTS PO) Take by mouth.  . ONE TOUCH ULTRA TEST test strip TEST once daily as directed  . Vitamin D, Cholecalciferol, 1000 units CAPS Take by mouth.   No current facility-administered medications on file prior to visit.     Allergies:  Allergies  Allergen Reactions  . Percocet [Oxycodone-Acetaminophen] Nausea And Vomiting  . Chantix [Varenicline] Other (See Comments)    Severe headache and vomiting  . Fluoxetine Other (See Comments)    Headaches  .  Lisinopril Cough  . Other     Other reaction(s): Unknown Contrast  . Verapamil Other (See Comments)    Lazy    Social History:  Social History   Socioeconomic History  . Marital status: Single    Spouse name: Not on file  . Number of children: Not on file  . Years of education: Not on file  . Highest education level: Not on file  Social Needs  . Financial resource strain: Not on file  . Food insecurity - worry: Not on file  . Food insecurity - inability: Not on file  . Transportation needs - medical: Not on file  . Transportation needs - non-medical: Not on file  Occupational History  . Not on file  Tobacco Use  . Smoking status: Former Smoker    Packs/day: 0.25    Types: Cigarettes    Last attempt to quit: 12/06/2015    Years since quitting: 1.9  . Smokeless tobacco: Never Used  Substance and Sexual Activity  . Alcohol use: No  . Drug use: No  . Sexual activity: No  Birth control/protection: None  Other Topics Concern  . Not on file  Social History Narrative  . Not on file   Social History   Tobacco Use  Smoking Status Former Smoker  . Packs/day: 0.25  . Types: Cigarettes  . Last attempt to quit: 12/06/2015  . Years since quitting: 1.9  Smokeless Tobacco Never Used   Social History   Substance and Sexual Activity  Alcohol Use No    Family History:  Family History  Problem Relation Age of Onset  . Alcohol abuse Mother   . Heart disease Mother   . Hypertension Mother   . Mental illness Mother   . Stroke Mother   . Heart disease Father   . Hyperlipidemia Father   . Drug abuse Sister   . Migraines Sister   . Drug abuse Brother   . Alcohol abuse Brother   . Heart disease Brother   . Hyperlipidemia Brother   . Bipolar disorder Daughter   . Anxiety disorder Daughter   . Bipolar disorder Son   . Alcohol abuse Son   . Alcohol abuse Brother   . Drug abuse Brother   . Drug abuse Brother   . Alcohol abuse Brother   . Asthma Brother   . Raynaud  syndrome Brother   . Heart disease Maternal Grandmother   . Diabetes Paternal Grandmother     Past medical history, surgical history, medications, allergies, family history and social history reviewed with patient today and changes made to appropriate areas of the chart.   Review of Systems  Constitutional: Positive for malaise/fatigue and weight loss. Negative for chills, diaphoresis and fever.  HENT: Negative.   Eyes: Negative.   Respiratory: Negative.   Cardiovascular: Negative.   Gastrointestinal: Negative.   Genitourinary: Negative.   Musculoskeletal: Positive for myalgias. Negative for back pain, falls, joint pain and neck pain.  Skin: Negative.   Neurological: Positive for dizziness, tingling and weakness. Negative for tremors, sensory change, speech change, focal weakness, seizures, loss of consciousness and headaches.  Endo/Heme/Allergies: Negative.   Psychiatric/Behavioral: Positive for depression. Negative for hallucinations, memory loss, substance abuse and suicidal ideas. The patient is nervous/anxious and has insomnia.     All other ROS negative except what is listed above and in the HPI.      Objective:    BP 119/77 (BP Location: Left Arm, Patient Position: Sitting, Cuff Size: Large)   Pulse 67   Temp 98 F (36.7 C)   SpO2 94%   Wt Readings from Last 3 Encounters:  07/22/17 214 lb 8 oz (97.3 kg)  07/22/17 214 lb 12.8 oz (97.4 kg)  12/29/16 218 lb (98.9 kg)    Physical Exam  Constitutional: She is oriented to person, place, and time. She appears well-developed and well-nourished. No distress.  HENT:  Head: Normocephalic and atraumatic.  Right Ear: Hearing, tympanic membrane, external ear and ear canal normal.  Left Ear: Hearing, tympanic membrane, external ear and ear canal normal.  Nose: Nose normal.  Mouth/Throat: Uvula is midline, oropharynx is clear and moist and mucous membranes are normal. No oropharyngeal exudate.  Eyes: Conjunctivae, EOM and lids are  normal. Pupils are equal, round, and reactive to light. Right eye exhibits no discharge. Left eye exhibits no discharge. No scleral icterus.  Neck: Normal range of motion. Neck supple. No JVD present. No tracheal deviation present. No thyromegaly present.  Cardiovascular: Normal rate, regular rhythm, normal heart sounds and intact distal pulses. Exam reveals no gallop and no friction rub.  No murmur heard. Pulmonary/Chest: Effort normal. No stridor. No respiratory distress. She has decreased breath sounds in the right upper field, the right middle field, the right lower field, the left upper field, the left middle field and the left lower field. She has no wheezes. She has no rales. She exhibits no tenderness.  Abdominal: Soft. Bowel sounds are normal. She exhibits no distension and no mass. There is no tenderness. There is no rebound and no guarding. A hernia is present. Hernia confirmed positive in the ventral area.  Genitourinary:  Genitourinary Comments: Breast and pelvic exams deferred at patient's request   Musculoskeletal: Normal range of motion. She exhibits no edema, tenderness or deformity.  Lymphadenopathy:    She has no cervical adenopathy.  Neurological: She is alert and oriented to person, place, and time. She has normal reflexes. She displays normal reflexes. No cranial nerve deficit. She exhibits normal muscle tone. Coordination normal.  Skin: Skin is warm, dry and intact. No rash noted. She is not diaphoretic. No erythema. No pallor.  Psychiatric: Her speech is normal and behavior is normal. Judgment and thought content normal. Cognition and memory are normal. She exhibits a depressed mood.    Results for orders placed or performed in visit on 07/22/17  Bayer DCA Hb A1c Waived  Result Value Ref Range   Bayer DCA Hb A1c Waived 5.9 <7.0 %      Assessment & Plan:   Problem List Items Addressed This Visit      Respiratory   COPD (chronic obstructive pulmonary disease) (HCC)     Stable. Continue current regimen. Continue to monitor. Call with any concerns.       Relevant Medications   theophylline (UNIPHYL) 400 MG 24 hr tablet   tiotropium (SPIRIVA HANDIHALER) 18 MCG inhalation capsule   albuterol (PROAIR HFA) 108 (90 Base) MCG/ACT inhaler   Other Relevant Orders   CBC with Differential/Platelet   Comprehensive metabolic panel   TSH   UA/M w/rflx Culture, Routine     Endocrine   Type 2 diabetes, diet controlled (HCC)    Under good control with A1c of 5.8. Continue current regimen. Continue to monitor. Call with any concerns.       Relevant Medications   losartan (COZAAR) 25 MG tablet   pravastatin (PRAVACHOL) 40 MG tablet   Other Relevant Orders   CBC with Differential/Platelet   Bayer DCA Hb A1c Waived   Comprehensive metabolic panel   Microalbumin, Urine Waived   TSH   UA/M w/rflx Culture, Routine     Genitourinary   Benign hypertensive renal disease    Over treated, has been going low. Will cut losartan in 1/2 and recheck 1 month. Call with any concerns.       Relevant Medications   losartan (COZAAR) 25 MG tablet   atenolol (TENORMIN) 50 MG tablet   Other Relevant Orders   CBC with Differential/Platelet   Comprehensive metabolic panel   Microalbumin, Urine Waived   TSH   UA/M w/rflx Culture, Routine   Chronic kidney disease    Under good control. Continue losartan. Call with any concerns.       Relevant Orders   CBC with Differential/Platelet   Comprehensive metabolic panel   Microalbumin, Urine Waived   TSH   UA/M w/rflx Culture, Routine     Other   Anxiety    Not under good control. Increase sertraline to 150mg  and recheck 1 month. Call with any concerns.       Relevant Medications  sertraline (ZOLOFT) 100 MG tablet   rOPINIRole (REQUIP) 0.5 MG tablet   gabapentin (NEURONTIN) 100 MG capsule   Other Relevant Orders   CBC with Differential/Platelet   Comprehensive metabolic panel   TSH   UA/M w/rflx Culture, Routine    Hyperlipidemia    Rechecking levels today. Tolerating medicine well. Call with any concerns.       Relevant Medications   losartan (COZAAR) 25 MG tablet   pravastatin (PRAVACHOL) 40 MG tablet   atenolol (TENORMIN) 50 MG tablet   Other Relevant Orders   CBC with Differential/Platelet   Comprehensive metabolic panel   Lipid Panel w/o Chol/HDL Ratio   TSH   UA/M w/rflx Culture, Routine   Restless legs syndrome (RLS)    Not under good control. Will increase requip to 0.5mg  TID. Recheck 1 month. Call with any concerns.       Relevant Orders   CBC with Differential/Platelet   Comprehensive metabolic panel   TSH   UA/M w/rflx Culture, Routine   Ferritin   Ventral hernia    Does not want to see surgery at this time to discuss repair.        Other Visit Diagnoses    Routine general medical examination at a health care facility    -  Primary   Vaccines up to date. Screening labs checked today. Refuses Pap, colonoscopy, mammogram and DEXA, continue to mointor.    Immunization due       Prevnar given today.   Relevant Orders   Pneumococcal conjugate vaccine 13-valent (Completed)       Follow up plan: Return in about 4 weeks (around 12/20/2017) for follow up mood and BP and RLS.   LABORATORY TESTING:  - Pap smear: Refused  IMMUNIZATIONS:   - Tdap: Tetanus vaccination status reviewed: last tetanus booster within 10 years. - Influenza: Up to date - Pneumovax: Up to date - Prevnar: Administered today - Zostavax vaccine: Refused  SCREENING: -Mammogram: Refused  - Colonoscopy: Refused  - Bone Density: Refused   PATIENT COUNSELING:   Advised to take 1 mg of folate supplement per day if capable of pregnancy.   Sexuality: Discussed sexually transmitted diseases, partner selection, use of condoms, avoidance of unintended pregnancy  and contraceptive alternatives.   Advised to avoid cigarette smoking.  I discussed with the patient that most people either abstain from  alcohol or drink within safe limits (<=14/week and <=4 drinks/occasion for males, <=7/weeks and <= 3 drinks/occasion for females) and that the risk for alcohol disorders and other health effects rises proportionally with the number of drinks per week and how often a drinker exceeds daily limits.  Discussed cessation/primary prevention of drug use and availability of treatment for abuse.   Diet: Encouraged to adjust caloric intake to maintain  or achieve ideal body weight, to reduce intake of dietary saturated fat and total fat, to limit sodium intake by avoiding high sodium foods and not adding table salt, and to maintain adequate dietary potassium and calcium preferably from fresh fruits, vegetables, and low-fat dairy products.    stressed the importance of regular exercise  Injury prevention: Discussed safety belts, safety helmets, smoke detector, smoking near bedding or upholstery.   Dental health: Discussed importance of regular tooth brushing, flossing, and dental visits.    NEXT PREVENTATIVE PHYSICAL DUE IN 1 YEAR. Return in about 4 weeks (around 12/20/2017) for follow up mood and BP and RLS.

## 2017-11-22 NOTE — Assessment & Plan Note (Signed)
Does not want to see surgery at this time to discuss repair.

## 2017-11-22 NOTE — Assessment & Plan Note (Signed)
Under good control. Continue losartan. Call with any concerns.

## 2017-11-22 NOTE — Assessment & Plan Note (Signed)
Not under good control. Will increase requip to 0.5mg  TID. Recheck 1 month. Call with any concerns.

## 2017-11-23 LAB — LIPID PANEL W/O CHOL/HDL RATIO
Cholesterol, Total: 168 mg/dL (ref 100–199)
HDL: 55 mg/dL (ref >39)
LDL Calculated: 94 mg/dL (ref 0–99)
Triglycerides: 94 mg/dL (ref 0–149)
VLDL Cholesterol Cal: 19 mg/dL (ref 5–40)

## 2017-11-23 LAB — COMPREHENSIVE METABOLIC PANEL
ALT: 16 IU/L (ref 0–32)
AST: 19 IU/L (ref 0–40)
Albumin/Globulin Ratio: 1.8 (ref 1.2–2.2)
Albumin: 4.1 g/dL (ref 3.6–4.8)
Alkaline Phosphatase: 92 IU/L (ref 39–117)
BUN/Creatinine Ratio: 21 (ref 12–28)
BUN: 27 mg/dL (ref 8–27)
Bilirubin Total: <0.2 mg/dL (ref 0.0–1.2)
CO2: 28 mmol/L (ref 20–29)
Calcium: 10.3 mg/dL (ref 8.7–10.3)
Chloride: 97 mmol/L (ref 96–106)
Creatinine, Ser: 1.26 mg/dL — ABNORMAL HIGH (ref 0.57–1.00)
GFR calc Af Amer: 52 mL/min/{1.73_m2} — ABNORMAL LOW (ref >59)
GFR calc non Af Amer: 45 mL/min/{1.73_m2} — ABNORMAL LOW (ref >59)
Globulin, Total: 2.3 g/dL (ref 1.5–4.5)
Glucose: 131 mg/dL — ABNORMAL HIGH (ref 65–99)
Potassium: 3.3 mmol/L — ABNORMAL LOW (ref 3.5–5.2)
Sodium: 144 mmol/L (ref 134–144)
Total Protein: 6.4 g/dL (ref 6.0–8.5)

## 2017-11-23 LAB — CBC WITH DIFFERENTIAL/PLATELET
Basophils Absolute: 0 10*3/uL (ref 0.0–0.2)
Basos: 0 %
EOS (ABSOLUTE): 0.3 10*3/uL (ref 0.0–0.4)
Eos: 3 %
Hematocrit: 36.2 % (ref 34.0–46.6)
Hemoglobin: 11.5 g/dL (ref 11.1–15.9)
Immature Grans (Abs): 0 10*3/uL (ref 0.0–0.1)
Immature Granulocytes: 0 %
Lymphocytes Absolute: 1.6 10*3/uL (ref 0.7–3.1)
Lymphs: 17 %
MCH: 29.1 pg (ref 26.6–33.0)
MCHC: 31.8 g/dL (ref 31.5–35.7)
MCV: 92 fL (ref 79–97)
Monocytes Absolute: 0.7 10*3/uL (ref 0.1–0.9)
Monocytes: 7 %
Neutrophils Absolute: 7.2 10*3/uL — ABNORMAL HIGH (ref 1.4–7.0)
Neutrophils: 73 %
Platelets: 270 10*3/uL (ref 150–379)
RBC: 3.95 x10E6/uL (ref 3.77–5.28)
RDW: 13.4 % (ref 12.3–15.4)
WBC: 9.8 10*3/uL (ref 3.4–10.8)

## 2017-11-23 LAB — FERRITIN: Ferritin: 33 ng/mL (ref 15–150)

## 2017-11-23 LAB — TSH: TSH: 2.8 u[IU]/mL (ref 0.450–4.500)

## 2017-11-25 ENCOUNTER — Other Ambulatory Visit: Payer: Self-pay | Admitting: Family Medicine

## 2017-11-25 NOTE — Telephone Encounter (Signed)
OK to call in

## 2017-11-29 ENCOUNTER — Telehealth: Payer: Self-pay

## 2017-11-29 MED ORDER — BUDESONIDE-FORMOTEROL FUMARATE 160-4.5 MCG/ACT IN AERO: 2.0000 | INHALATION_SPRAY | Freq: Two times a day (BID) | RESPIRATORY_TRACT | 4 refills | Status: DC

## 2017-11-29 MED ORDER — HYDROCHLOROTHIAZIDE 25 MG PO TABS: 25.0000 mg | ORAL_TABLET | Freq: Every day | ORAL | 1 refills | Status: DC

## 2017-11-29 NOTE — Telephone Encounter (Signed)
Pharmacy is requesting that hydrochlorothiazide 25 mg and Symbicort 160/4.5 mg be sent to them   They also want Klonopin, but that is only sent to local pharmacies correct?

## 2017-11-29 NOTE — Telephone Encounter (Signed)
Patient notified

## 2017-11-29 NOTE — Telephone Encounter (Signed)
HCTZ and symbicort sent to her pharmacy- klonopin will need to go to local pharmacy

## 2017-12-09 ENCOUNTER — Other Ambulatory Visit: Payer: Self-pay | Admitting: Family Medicine

## 2017-12-09 NOTE — Telephone Encounter (Signed)
Refill request for Theophylline /   Disp Refills Start End   theophylline (UNIPHYL) 400 MG 24 hr tablet 90 tablet 1 11/22/2017    Sig - Route: Take 1 tablet (400 mg total) by mouth daily. - Oral   Sent to pharmacy as: theophylline (UNIPHYL) 400 MG 24 hr tablet   E-Prescribing Status: Receipt confirmed by pharmacy (11/22/2017 10:22 AM EST)    Not due at this time based on last prescription.

## 2017-12-23 ENCOUNTER — Ambulatory Visit: Payer: Self-pay | Admitting: Family Medicine

## 2017-12-28 ENCOUNTER — Ambulatory Visit: Payer: Medicare HMO | Admitting: Family Medicine

## 2017-12-29 ENCOUNTER — Other Ambulatory Visit: Payer: Self-pay | Admitting: Family Medicine

## 2017-12-29 DIAGNOSIS — I129 Hypertensive chronic kidney disease with stage 1 through stage 4 chronic kidney disease, or unspecified chronic kidney disease: Secondary | ICD-10-CM

## 2018-01-03 ENCOUNTER — Encounter: Payer: Self-pay | Admitting: Family Medicine

## 2018-01-03 ENCOUNTER — Ambulatory Visit (INDEPENDENT_AMBULATORY_CARE_PROVIDER_SITE_OTHER): Payer: Medicare HMO | Admitting: Family Medicine

## 2018-01-03 VITALS — BP 126/74 | HR 88 | Ht 60.0 in | Wt 214.0 lb

## 2018-01-03 DIAGNOSIS — I129 Hypertensive chronic kidney disease with stage 1 through stage 4 chronic kidney disease, or unspecified chronic kidney disease: Secondary | ICD-10-CM | POA: Diagnosis not present

## 2018-01-03 DIAGNOSIS — M79601 Pain in right arm: Secondary | ICD-10-CM | POA: Diagnosis not present

## 2018-01-03 DIAGNOSIS — G2581 Restless legs syndrome: Secondary | ICD-10-CM

## 2018-01-03 DIAGNOSIS — F419 Anxiety disorder, unspecified: Secondary | ICD-10-CM

## 2018-01-03 MED ORDER — DICLOFENAC SODIUM 1 % TD GEL: 4.0000 g | Freq: Four times a day (QID) | TRANSDERMAL | 12 refills | Status: DC

## 2018-01-03 NOTE — Patient Instructions (Signed)
Biceps Tendon Tendinitis (Proximal) and Tenosynovitis The proximal biceps tendon is a strong cord of tissue that connects the biceps muscle, on the front of the upper arm, to the shoulder blade. Tendinitis is inflammation of a tendon. Tenosynovitis is inflammation of the lining around the tendon (tendon sheath). These conditions often occur at the same time, and they can interfere with the ability to bend the elbow and turn the hand palm-up (supination). Proximal biceps tendon tendinitis and tenosynovitis are usually caused by overusing the shoulder joint and the biceps muscle. These conditions usually heal within 6 weeks. Proximal biceps tendon tendinitis may include a grade 1 or grade 2 strain of the tendon. A grade 1 strain is mild, and it involves a slight pull of the tendon without any stretching or noticeable tearing of the tendon. There is usually no loss of biceps muscle strength. A grade 2 strain is moderate, and it involves a small tear in the tendon. The tendon is stretched, and biceps strength is usually decreased. What are the causes? This condition may be caused by:  A sudden increase in frequency or intensity of activity that involves the shoulder and the biceps muscle.  Overuse of the biceps muscle. This can happen when you do the same movements over and over, such as: ? Supination. ? Forceful straightening (hyperextension) of the elbow. ? Bending the elbow.  A direct, forceful hit or injury (trauma) to the elbow. This is rare.  What increases the risk? The following factors may make you more likely to develop this condition:  Playing contact sports.  Playing sports that involve throwing and overhead movements, including racket sports, gymnastics, weight lifting, or bodybuilding.  Doing physical labor.  Having poor strength and flexibility of the arm and shoulder.  What are the signs or symptoms? Symptoms of this condition may include:  Pain and inflammation in the front  of the shoulder. Pain may get worse with movement, especially when you use resistance, as in weight lifting.  A feeling of warmth in the front of the shoulder.  Limited range of motion of the shoulder and the elbow.  A crackling sound (crepitation) when you move or touch the shoulder or the upper arm.  In some cases, symptoms may return (recur) after treatment, and they may be long-lasting (chronic). How is this diagnosed? This condition is diagnosed based on your symptoms, your medical history, and a physical exam. You may have tests, including X-rays or MRIs. Your health care provider may test your range of motion by asking you to do arm movements. How is this treated? This condition is treated by resting and icing the injured area, and by doing physical therapy exercises. Depending on the severity of your condition, treatment may also include:  Medicines to help relieve pain and inflammation.  Ultrasound therapy. This is the application of sound waves to the injured area.  Injecting medicines (corticosteroids) into your tendon sheath.  Injecting medicines that numb the area (local anesthetics).  Surgery to remove the damaged part of the tendon and reattach the undamaged part of the tendon to the arm bone (humerus). This is usually only done if you have symptoms that do not get better with other treatment methods.  Follow these instructions at home: Managing pain, stiffness, and swelling  If directed, put ice on the injured area: ? Put ice in a plastic bag. ? Place a towel between your skin and the bag. ? Leave the ice on for 20 minutes, 2-3 times a day.    Move your fingers often to avoid stiffness and to lessen swelling.  Raise (elevate) the injured area above the level of your heart while you are sitting or lying down.  If directed, apply heat to the affected area before you exercise. Use the heat source that your health care provider recommends, such as a moist heat pack or a  heating pad. ? Place a towel between your skin and the heat source. ? Leave the heat on for 20-30 minutes. ? Remove the heat if your skin turns bright red. This is especially important if you are unable to feel pain, heat, or cold. You may have a greater risk of getting burned. Activity  Return to your normal activities as told by your health care provider. Ask your health care provider what activities are safe for you.  Do not lift anything that is heavier than 10 lb (4.5 kg) until your health care provider tells you that it is safe.  Avoid activities that cause pain or make your condition worse.  Do exercises as told by your health care provider. General instructions  Take over-the-counter and prescription medicines only as told by your health care provider.  Do not drive or operate heavy machinery while taking prescription pain medicines.  Keep all follow-up visits as told by your health care provider. This is important. How is this prevented?  Warm up and stretch before being active.  Cool down and stretch after being active.  Give your body time to rest between periods of activity.  Make sure any equipment that you use is fitted to you.  Be safe and responsible while being active to avoid falls.  Do at least 150 minutes of moderate-intensity aerobic exercise each week, such as brisk walking or water aerobics.  Maintain physical fitness, including: ? Strength. ? Flexibility. ? Cardiovascular fitness. ? Endurance. Contact a health care provider if:  You have symptoms that get worse or do not get better after 2 weeks of treatment.  You develop new symptoms. Get help right away if:  You develop severe pain. This information is not intended to replace advice given to you by your health care provider. Make sure you discuss any questions you have with your health care provider. Document Released: 09/14/2005 Document Revised: 05/21/2016 Document Reviewed:  08/23/2015 [employer] Interactive Patient Education  2018 [employer]  

## 2018-01-03 NOTE — Progress Notes (Signed)
BP 126/74   Pulse 88   Ht 5' (1.524 m)   Wt 214 lb (97.1 kg)   SpO2 98%   BMI 41.79 kg/m    Subjective:    Patient ID: [name], female    DOB: [birth year], 67 y.o.   MRN: [id number]  HPI: [name] is a 67 y.o. female  Chief Complaint  Patient presents with  . Hypertension  . Anxiety  . Depression  . RLS   Has been having pain in her R side for the last week. Has been having pain in her R arm. Hurts to touch. Goes into her neck. Movement makes it worse, hurts to brush her hair. Has been having some shooting pains in her head, occasionally in her temple.   HYPERTENSION- feeling much better since decreasing her blood pressure medicine.  Hypertension status: controlled  Satisfied with current treatment? yes Duration of hypertension: chronic BP monitoring frequency:  not checking BP medication side effects:  no Medication compliance: excellent compliance Aspirin: yes Recurrent headaches: no Visual changes: no Palpitations: no Dyspnea: no Chest pain: no Lower extremity edema: no Dizzy/lightheaded: no  ANXIETY/STRESS- thinks that her mood is a bit calmer on the 16m zoloft. Still very depressed. Does not want to go up on the medicine right now.  Duration:exacerbated Anxious mood: yes  Excessive worrying: yes Irritability: yes  Sweating: no Nausea: no Palpitations:no Hyperventilation: no Panic attacks: no Agoraphobia: no  Obscessions/compulsions: no Depressed mood: yes [facility] 01/03/2018 11/22/2017 07/22/2017 04/06/2017 04/06/2017  Decreased Interest 1 2 0 1 1  Down, Depressed, Hopeless '2 3 3 2 2  ' PHQ - 2 Score '3 5 3 3 3  ' Altered sleeping 2 3 0 2 -  Tired, decreased energy '2 3 3 2 ' -  Change in appetite 2 3 0 2 -  Feeling bad or failure about yourself  2 3 0 1 -  Trouble concentrating 2 3 0 1 -  Moving slowly or fidgety/restless 2 0 0 1 -  Suicidal thoughts 1 1 0 0 -  PHQ-9 Score '16 21 6 12 ' -  Difficult doing work/chores Very  difficult Somewhat difficult Somewhat difficult - -   GAD 7 : Generalized Anxiety Score 01/03/2018 11/22/2017 04/06/2017 01/21/2016  Nervous, Anxious, on Edge '1 2 3 1  ' Control/stop worrying '2 1 2 ' 0  Worry too much - different things '2 1 2 ' 0  Trouble relaxing '1 2 2 1  ' Restless '1 3 1 ' 0  Easily annoyed or irritable 1 0 1 0  Afraid - awful might happen 1 0 2 0  Total GAD 7 Score '9 9 13 2  ' Anxiety Difficulty Very difficult Somewhat difficult - Not difficult at all   Anhedonia: no Weight changes: no Insomnia: no   Hypersomnia: no Fatigue/loss of energy: no Feelings of worthlessness: yes Feelings of guilt: yes Impaired concentration/indecisiveness: yes Suicidal ideations: no  Crying spells: yes Recent Stressors/Life Changes: yes   Relationship problems: no   Family stress: yes     Financial stress: yes    Job stress: no    Recent death/loss: no  RESTLESS LEGS- thinks the RLS is a little better on the TID requip, not quite there, but don't want to go up on her medicine.  Duration: chronic Discomfort description:  Creeping and crawling Pain: no Location: lower legs Bilateral: yes Symmetric: yes Severity: severe Onset:  gradual Frequency:  At bedtime Symptoms only occur while legs at rest: yes Sudden  unintentional leg jerking: yes Bed partner bothered by leg movements: no LE numbness: yes Decreased sensation: yes Weakness: yes Insomnia: yes Daytime somnolence: no Fatigue: yes  Relevant past medical, surgical, family and social history reviewed and updated as indicated. Interim medical history since our last visit reviewed. Allergies and medications reviewed and updated.  Review of Systems  Constitutional: Negative.   Respiratory: Negative.   Cardiovascular: Negative.   Musculoskeletal: Positive for arthralgias, myalgias and neck pain. Negative for back pain, gait problem, joint swelling and neck stiffness.  Skin: Negative.   Neurological: Positive for dizziness, weakness  and numbness. Negative for tremors, seizures, syncope, facial asymmetry, speech difficulty, light-headedness and headaches.  Hematological: Negative.   Psychiatric/Behavioral: Positive for dysphoric mood. Negative for agitation, behavioral problems, confusion, decreased concentration, hallucinations, self-injury, sleep disturbance and suicidal ideas. The patient is nervous/anxious. The patient is not hyperactive.     Per HPI unless specifically indicated above     Objective:    BP 126/74   Pulse 88   Ht 5' (1.524 m)   Wt 214 lb (97.1 kg)   SpO2 98%   BMI 41.79 kg/m   Wt Readings from Last 3 Encounters:  01/03/18 214 lb (97.1 kg)  07/22/17 214 lb 8 oz (97.3 kg)  07/22/17 214 lb 12.8 oz (97.4 kg)    Physical Exam  Constitutional: She is oriented to person, place, and time. She appears well-developed and well-nourished. No distress.  HENT:  Head: Normocephalic and atraumatic.  Right Ear: Hearing normal.  Left Ear: Hearing normal.  Nose: Nose normal.  Eyes: Conjunctivae and lids are normal. Right eye exhibits no discharge. Left eye exhibits no discharge. No scleral icterus.  Cardiovascular: Normal rate, regular rhythm, normal heart sounds and intact distal pulses. Exam reveals no gallop and no friction rub.  No murmur heard. Pulmonary/Chest: Effort normal and breath sounds normal. No respiratory distress. She has no wheezes. She has no rales. She exhibits no tenderness.  Musculoskeletal: Normal range of motion.  Neurological: She is alert and oriented to person, place, and time.  Skin: Skin is warm, dry and intact. No rash noted. She is not diaphoretic. No erythema. No pallor.  Psychiatric: Her speech is normal and behavior is normal. Judgment and thought content normal. Cognition and memory are normal. She exhibits a depressed mood.  Nursing note and vitals reviewed.   Results for orders placed or performed in visit on 11/22/17  Microscopic Examination  Result Value Ref Range     WBC, UA 0-5 0 - 5 /hpf   RBC, UA 0-2 0 - 2 /hpf   Epithelial Cells (non renal) 0-10 0 - 10 /hpf   Renal Epithel, UA 0-10 (A) None seen /hpf   Casts Present None seen /lpf   Cast Type White cell casts (A) N/A   Bacteria, UA Few None seen/Few  CBC with Differential/Platelet  Result Value Ref Range   WBC 9.8 3.4 - 10.8 x10E3/uL   RBC 3.95 3.77 - 5.28 x10E6/uL   Hemoglobin 11.5 11.1 - 15.9 g/dL   Hematocrit 36.2 34.0 - 46.6 %   MCV 92 79 - 97 fL   MCH 29.1 26.6 - 33.0 pg   MCHC 31.8 31.5 - 35.7 g/dL   RDW 13.4 12.3 - 15.4 %   Platelets 270 150 - 379 x10E3/uL   Neutrophils 73 Not Estab. %   Lymphs 17 Not Estab. %   Monocytes 7 Not Estab. %   Eos 3 Not Estab. %   Basos 0  Not Estab. %   Neutrophils Absolute 7.2 (H) 1.4 - 7.0 x10E3/uL   Lymphocytes Absolute 1.6 0.7 - 3.1 x10E3/uL   Monocytes Absolute 0.7 0.1 - 0.9 x10E3/uL   EOS (ABSOLUTE) 0.3 0.0 - 0.4 x10E3/uL   Basophils Absolute 0.0 0.0 - 0.2 x10E3/uL   Immature Granulocytes 0 Not Estab. %   Immature Grans (Abs) 0.0 0.0 - 0.1 x10E3/uL  Bayer DCA Hb A1c Waived  Result Value Ref Range   Bayer DCA Hb A1c Waived 5.8 <7.0 %  Comprehensive metabolic panel  Result Value Ref Range   Glucose 131 (H) 65 - 99 mg/dL   BUN 27 8 - 27 mg/dL   Creatinine, Ser 1.26 (H) 0.57 - 1.00 mg/dL   GFR calc non Af Amer 45 (L) >59 mL/min/1.73   GFR calc Af Amer 52 (L) >59 mL/min/1.73   BUN/Creatinine Ratio 21 12 - 28   Sodium 144 134 - 144 mmol/L   Potassium 3.3 (L) 3.5 - 5.2 mmol/L   Chloride 97 96 - 106 mmol/L   CO2 28 20 - 29 mmol/L   Calcium 10.3 8.7 - 10.3 mg/dL   Total Protein 6.4 6.0 - 8.5 g/dL   Albumin 4.1 3.6 - 4.8 g/dL   Globulin, Total 2.3 1.5 - 4.5 g/dL   Albumin/Globulin Ratio 1.8 1.2 - 2.2   Bilirubin Total <0.2 0.0 - 1.2 mg/dL   Alkaline Phosphatase 92 39 - 117 IU/L   AST 19 0 - 40 IU/L   ALT 16 0 - 32 IU/L  Lipid Panel w/o Chol/HDL Ratio  Result Value Ref Range   Cholesterol, Total 168 100 - 199 mg/dL   Triglycerides 94 0  - 149 mg/dL   HDL 55 >39 mg/dL   VLDL Cholesterol Cal 19 5 - 40 mg/dL   LDL Calculated 94 0 - 99 mg/dL  Microalbumin, Urine Waived  Result Value Ref Range   Microalb, Ur Waived 80 (H) 0 - 19 mg/L   Creatinine, Urine Waived 200 10 - 300 mg/dL   Microalb/Creat Ratio 30-300 (H) <30 mg/g  TSH  Result Value Ref Range   TSH 2.800 0.450 - 4.500 uIU/mL  UA/M w/rflx Culture, Routine  Result Value Ref Range   Specific Gravity, UA 1.025 1.005 - 1.030   pH, UA 6.0 5.0 - 7.5   Color, UA Yellow Yellow   Appearance Ur Cloudy (A) Clear   Leukocytes, UA Trace (A) Negative   Protein, UA Trace (A) Negative/Trace   Glucose, UA Negative Negative   Ketones, UA Negative Negative   RBC, UA Trace (A) Negative   Bilirubin, UA Negative Negative   Urobilinogen, Ur 0.2 0.2 - 1.0 mg/dL   Nitrite, UA Negative Negative   Microscopic Examination See below:   Ferritin  Result Value Ref Range   Ferritin 33 15 - 150 ng/mL      Assessment & Plan:   Problem List Items Addressed This Visit      Genitourinary   Benign hypertensive renal disease    Not getting hypotensive anymore. Doing well on current regimen. Continue current regimen. Continue to monitor. Call with any concerns.       Relevant Orders   Basic metabolic panel     Other   Anxiety    Anxiety may be slightly better on current regimen, depression still not doing well, recently exacerbated by visit to family. Wants to stay at current dose for now. Continue current regimen. Continue to monitor. Call with any concerns.  Restless legs syndrome (RLS)    Maybe slightly better on increased requip. Wants to stay at current dose for now. Continue current regimen. Continue to monitor. Call with any concerns.        Other Visit Diagnoses    Right arm pain    -  Primary   Will check ESR to look for PMR. Will also obtain x-rays. Voltaren and exercises. Call with any concerns.    Relevant Orders   Sed Rate (ESR)   DG Cervical Spine Complete    DG Shoulder Right       Follow up plan: Return End of May , for DM follow up.

## 2018-01-03 NOTE — Assessment & Plan Note (Signed)
Not getting hypotensive anymore. Doing well on current regimen. Continue current regimen. Continue to monitor. Call with any concerns.

## 2018-01-03 NOTE — Assessment & Plan Note (Signed)
Anxiety may be slightly better on current regimen, depression still not doing well, recently exacerbated by visit to family. Wants to stay at current dose for now. Continue current regimen. Continue to monitor. Call with any concerns.

## 2018-01-03 NOTE — Assessment & Plan Note (Signed)
Maybe slightly better on increased requip. Wants to stay at current dose for now. Continue current regimen. Continue to monitor. Call with any concerns.

## 2018-01-04 LAB — BASIC METABOLIC PANEL
BUN/Creatinine Ratio: 16 (ref 12–28)
BUN: 17 mg/dL (ref 8–27)
CO2: 31 mmol/L — ABNORMAL HIGH (ref 20–29)
Calcium: 9.9 mg/dL (ref 8.7–10.3)
Chloride: 95 mmol/L — ABNORMAL LOW (ref 96–106)
Creatinine, Ser: 1.04 mg/dL — ABNORMAL HIGH (ref 0.57–1.00)
GFR calc Af Amer: 65 mL/min/{1.73_m2} (ref >59)
GFR calc non Af Amer: 57 mL/min/{1.73_m2} — ABNORMAL LOW (ref >59)
Glucose: 93 mg/dL (ref 65–99)
Potassium: 3.4 mmol/L — ABNORMAL LOW (ref 3.5–5.2)
Sodium: 143 mmol/L (ref 134–144)

## 2018-01-04 LAB — SEDIMENTATION RATE: Sed Rate: 42 mm/hr — ABNORMAL HIGH (ref 0–40)

## 2018-01-05 ENCOUNTER — Ambulatory Visit
Admission: RE | Admit: 2018-01-05 | Discharge: 2018-01-05 | Disposition: A | Discharge status: Home / Self Care | Payer: Medicare HMO | Source: Ambulatory Visit | Attending: Family Medicine | Admitting: Family Medicine

## 2018-01-05 DIAGNOSIS — M79601 Pain in right arm: Secondary | ICD-10-CM | POA: Insufficient documentation

## 2018-01-05 DIAGNOSIS — M47812 Spondylosis without myelopathy or radiculopathy, cervical region: Secondary | ICD-10-CM | POA: Insufficient documentation

## 2018-01-05 DIAGNOSIS — M542 Cervicalgia: Secondary | ICD-10-CM | POA: Diagnosis present

## 2018-01-06 ENCOUNTER — Telehealth: Payer: Self-pay | Admitting: Family Medicine

## 2018-01-06 NOTE — Telephone Encounter (Signed)
Please let her know that she had a little tendonitis in her shoulder and arthritis in her neck. We can get her set up for an MRI on her neck or we can get her into see the orthopedist

## 2018-01-06 NOTE — Telephone Encounter (Signed)
FYI

## 2018-01-06 NOTE — Telephone Encounter (Signed)
Spoke with patient regarding the information per Dr Laural BenesJohnson, the patient does not want to do either of the suggestions she states she will just live with it.  I told her if Dr Laural BenesJohnson needed to give her anymore information she would give her a call back.  Just FYI  Thanks

## 2018-01-07 ENCOUNTER — Other Ambulatory Visit: Payer: Self-pay | Admitting: Family Medicine

## 2018-01-07 DIAGNOSIS — I129 Hypertensive chronic kidney disease with stage 1 through stage 4 chronic kidney disease, or unspecified chronic kidney disease: Secondary | ICD-10-CM

## 2018-01-07 NOTE — Telephone Encounter (Signed)
Noted  

## 2018-01-07 NOTE — Telephone Encounter (Signed)
Spiriva handihaler, atenolol, Zoloft refill requests  LOV 11/22/17 with Olevia PerchesMegan Johnson.   She wanted a 1 month follow up due to medication changes.   12/23/17 cancelled by pt;  12/28/17 was a no show.  Has an appt 02/14/18 with Megan.  [facility] [phone]- [place], [place] - [address]

## 2018-01-07 NOTE — Telephone Encounter (Signed)
Spoke with [place]Brittany, Associate Professorharmacy Tech at [facility]. They do have refills on file she will get them ready and sent out.

## 2018-01-07 NOTE — Telephone Encounter (Signed)
6 month supply sent in at the end of February- should not be due. Can we make sure it got to her pharmacy

## 2018-01-12 ENCOUNTER — Telehealth: Payer: Self-pay | Admitting: Family Medicine

## 2018-01-12 NOTE — Telephone Encounter (Signed)
Copied from CRM [phone]. Topic: Quick Communication - See Telephone Encounter >> Jan 12, 2018  6:03 PM Trula SladeWalter, Linda F wrote: CRM for notification. See Telephone encounter for: 01/12/18. Patient would like a refill on her diclofenac sodium (VOLTAREN) 1 % GEL and send it to her preferred pharmacy [facility].

## 2018-01-13 MED ORDER — DICLOFENAC SODIUM 1 % TD GEL: 4.0000 g | Freq: Four times a day (QID) | TRANSDERMAL | 11 refills | Status: DC

## 2018-01-13 NOTE — Telephone Encounter (Signed)
Requested Diclofenac Sodium 1% gel be transferred to [facility].  Last refill: 01/03/18; 100 gm.; RF x 12 Last office visit: 01/03/18 PCP: Dr. Laural BenesJohnson  Will transfer Diclofenac Rx per pt. Request.  Left vm for pt. Re: the above.

## 2018-01-27 ENCOUNTER — Other Ambulatory Visit: Payer: Self-pay | Admitting: Family Medicine

## 2018-01-27 DIAGNOSIS — I129 Hypertensive chronic kidney disease with stage 1 through stage 4 chronic kidney disease, or unspecified chronic kidney disease: Secondary | ICD-10-CM

## 2018-01-31 ENCOUNTER — Other Ambulatory Visit: Payer: Self-pay | Admitting: Family Medicine

## 2018-01-31 ENCOUNTER — Telehealth: Payer: Self-pay | Admitting: Family Medicine

## 2018-01-31 DIAGNOSIS — E782 Mixed hyperlipidemia: Secondary | ICD-10-CM

## 2018-01-31 NOTE — Telephone Encounter (Signed)
This was refilled already.

## 2018-01-31 NOTE — Telephone Encounter (Signed)
Copied from CRM [id number]. Topic: Quick Communication - Rx Refill/Question >> Jan 31, 2018  1:04 PM Mickel Baas B, NT wrote: Medication: albuterol (PROVENTIL) (2.5 MG/3ML) 0.083% nebulizer solution Has the patient contacted their pharmacy? Yes.   (Agent: If no, request that the patient contact the pharmacy for the refill.) Preferred Pharmacy (with phone number or street name): [id number] - [place], [place] - [address] AT [facility] [address] Agent: Please be advised that RX refills may take up to 3 business days. We ask that you follow-up with your pharmacy.

## 2018-02-14 ENCOUNTER — Encounter: Payer: Self-pay | Admitting: Family Medicine

## 2018-02-14 ENCOUNTER — Ambulatory Visit (INDEPENDENT_AMBULATORY_CARE_PROVIDER_SITE_OTHER): Payer: Medicare HMO | Admitting: Family Medicine

## 2018-02-14 VITALS — BP 107/63 | HR 78 | Temp 98.0°F

## 2018-02-14 DIAGNOSIS — J441 Chronic obstructive pulmonary disease with (acute) exacerbation: Secondary | ICD-10-CM

## 2018-02-14 DIAGNOSIS — E119 Type 2 diabetes mellitus without complications: Secondary | ICD-10-CM | POA: Diagnosis not present

## 2018-02-14 DIAGNOSIS — I952 Hypotension due to drugs: Secondary | ICD-10-CM | POA: Diagnosis not present

## 2018-02-14 LAB — BAYER DCA HB A1C WAIVED: HB A1C (BAYER DCA - WAIVED): 6.1 % (ref <7.0)

## 2018-02-14 MED ORDER — AZITHROMYCIN 250 MG PO TABS: ORAL_TABLET | ORAL | 0 refills | Status: DC

## 2018-02-14 MED ORDER — PREDNISONE 10 MG PO TABS: ORAL_TABLET | ORAL | 0 refills | Status: DC

## 2018-02-14 NOTE — Assessment & Plan Note (Signed)
Under good control with A1c of 6.1 off medicine. Continue diet and exercise. Continue to monitor. Call with any concerns.

## 2018-02-14 NOTE — Progress Notes (Signed)
BP 107/63 (BP Location: Right Arm, Patient Position: Sitting, Cuff Size: Large)   Pulse 78   Temp 98 F (36.7 C) (Oral)   SpO2 93%    Subjective:    Patient ID: [name], female    DOB: [birth year], 67 y.o.   MRN: [id number]  HPI: [name] is a 67 y.o. female  Chief Complaint  Patient presents with  . Diabetes   DIABETES Hypoglycemic episodes:no Polydipsia/polyuria: no Visual disturbance: no Chest pain: no Paresthesias: no Glucose Monitoring: no Taking Insulin?: no Blood Pressure Monitoring: a few times a week Retinal Examination: Not up to Date Foot Exam: Up to Date Diabetic Education: Not Completed Pneumovax: Up to Date Influenza: Up to Date Aspirin: yes  UPPER RESPIRATORY TRACT INFECTION Duration: 7 days Worst symptom: cough Fever: yes Cough: yes Shortness of breath: yes Wheezing: yes Chest pain: yes, with cough Chest tightness: yes Chest congestion: yes Nasal congestion: no Runny nose: no Post nasal drip: no Sneezing: no Sore throat: no Swollen glands: no Sinus pressure: no Headache: no Face pain: no Toothache: no Ear pain: no  Ear pressure: no  Eyes red/itching:no Eye drainage/crusting: no  Vomiting: no Rash: no Fatigue: yes Sick contacts: no Strep contacts: no  Context: worse Recurrent sinusitis: no Relief with OTC cold/cough medications: no  Treatments attempted: none   BP has been running low- in the 70s-80s/40s-50s. Feeling very tired.   Relevant past medical, surgical, family and social history reviewed and updated as indicated. Interim medical history since our last visit reviewed. Allergies and medications reviewed and updated.  Review of Systems  Constitutional: Negative.   HENT: Negative.   Respiratory: Positive for cough, chest tightness, shortness of breath and wheezing. Negative for apnea, choking and stridor.   Cardiovascular: Negative.   Gastrointestinal: Negative.   Neurological: Positive for dizziness  and light-headedness. Negative for tremors, seizures, syncope, facial asymmetry, speech difficulty, weakness, numbness and headaches.  Psychiatric/Behavioral: Negative.     Per HPI unless specifically indicated above     Objective:    BP 107/63 (BP Location: Right Arm, Patient Position: Sitting, Cuff Size: Large)   Pulse 78   Temp 98 F (36.7 C) (Oral)   SpO2 93%   Wt Readings from Last 3 Encounters:  01/03/18 214 lb (97.1 kg)  07/22/17 214 lb 8 oz (97.3 kg)  07/22/17 214 lb 12.8 oz (97.4 kg)    Physical Exam  Constitutional: She is oriented to person, place, and time. She appears well-developed and well-nourished. No distress.  HENT:  Head: Normocephalic and atraumatic.  Right Ear: Hearing and external ear normal.  Left Ear: Hearing and external ear normal.  Nose: Nose normal.  Mouth/Throat: Oropharynx is clear and moist. No oropharyngeal exudate.  Eyes: Pupils are equal, round, and reactive to light. Conjunctivae, EOM and lids are normal. Right eye exhibits no discharge. Left eye exhibits no discharge. No scleral icterus.  Neck: Normal range of motion. Neck supple. No JVD present. No tracheal deviation present. No thyromegaly present.  Cardiovascular: Normal rate, regular rhythm, normal heart sounds and intact distal pulses. Exam reveals no gallop and no friction rub.  No murmur heard. Pulmonary/Chest: Effort normal. No stridor. No respiratory distress. She has wheezes. She has no rales. She exhibits no tenderness.  Musculoskeletal: Normal range of motion.  Lymphadenopathy:    She has no cervical adenopathy.  Neurological: She is alert and oriented to person, place, and time.  Skin: Skin is warm, dry and intact. Capillary refill  takes less than 2 seconds. No rash noted. She is not diaphoretic. No erythema. No pallor.  Psychiatric: She has a normal mood and affect. Her speech is normal and behavior is normal. Judgment and thought content normal. Cognition and memory are normal.      Results for orders placed or performed in visit on 12/30/57  Basic metabolic panel  Result Value Ref Range   Glucose 93 65 - 99 mg/dL   BUN 17 8 - 27 mg/dL   Creatinine, Ser 1.04 (H) 0.57 - 1.00 mg/dL   GFR calc non Af Amer 57 (L) >59 mL/min/1.73   GFR calc Af Amer 65 >59 mL/min/1.73   BUN/Creatinine Ratio 16 12 - 28   Sodium 143 134 - 144 mmol/L   Potassium 3.4 (L) 3.5 - 5.2 mmol/L   Chloride 95 (L) 96 - 106 mmol/L   CO2 31 (H) 20 - 29 mmol/L   Calcium 9.9 8.7 - 10.3 mg/dL  Sed Rate (ESR)  Result Value Ref Range   Sed Rate 42 (H) 0 - 40 mm/hr      Assessment & Plan:   Problem List Items Addressed This Visit      Respiratory   COPD (chronic obstructive pulmonary disease) ([place])    Will treat with prednisone and azithromycin. Recheck 2 weeks. Call with any concerns.       Relevant Medications   predniSONE (DELTASONE) 10 MG tablet   azithromycin (ZITHROMAX) 250 MG tablet     Endocrine   Type 2 diabetes, diet controlled ([place]) - Primary    Under good control with A1c of 6.1 off medicine. Continue diet and exercise. Continue to monitor. Call with any concerns.       Relevant Orders   Bayer DCA Hb A1c Waived    Other Visit Diagnoses    Hypotension due to drugs       Will stop HCTZ. Call with concerns. Recheck 2 weeks.       Follow up plan: Return in about 2 weeks (around 02/28/2018).

## 2018-02-14 NOTE — Assessment & Plan Note (Signed)
Will treat with prednisone and azithromycin. Recheck 2 weeks. Call with any concerns.

## 2018-02-21 ENCOUNTER — Encounter: Payer: Self-pay | Admitting: Family Medicine

## 2018-02-22 ENCOUNTER — Other Ambulatory Visit: Payer: Self-pay | Admitting: Family Medicine

## 2018-02-22 DIAGNOSIS — F419 Anxiety disorder, unspecified: Secondary | ICD-10-CM

## 2018-02-22 NOTE — Telephone Encounter (Signed)
Patient states that her oxygen is back up to 88, I let her know that Dr.Johnson wanted her to go to the ER and that if this ever happens again to please call the office do not send a my chart message.

## 2018-02-22 NOTE — Telephone Encounter (Signed)
This is not an appropriate mychart message. Please have her go to the ER. Thanks!

## 2018-02-24 NOTE — Telephone Encounter (Signed)
sertraline refill Last Refill:11/22/17 # 135 1 RF Last OV: 01/03/18 PCP: Olevia Perches DO Pharmacy:[facility]   ropinirole refill Last Refill:11/22/17 # 270 1 RF Last OV: 01/03/18   Theophylline refill Last Refill:11/22/17 # 90 tab 1 RF Last OV: 11/22/17  Requests refused d/t requesting meds too soon

## 2018-03-01 ENCOUNTER — Encounter: Payer: Self-pay | Admitting: Family Medicine

## 2018-03-01 ENCOUNTER — Ambulatory Visit (INDEPENDENT_AMBULATORY_CARE_PROVIDER_SITE_OTHER): Payer: Medicare HMO | Admitting: Family Medicine

## 2018-03-01 VITALS — BP 123/77 | HR 86 | Temp 98.2°F

## 2018-03-01 DIAGNOSIS — J449 Chronic obstructive pulmonary disease, unspecified: Secondary | ICD-10-CM | POA: Diagnosis not present

## 2018-03-01 DIAGNOSIS — I129 Hypertensive chronic kidney disease with stage 1 through stage 4 chronic kidney disease, or unspecified chronic kidney disease: Secondary | ICD-10-CM | POA: Diagnosis not present

## 2018-03-01 MED ORDER — PREDNISONE 10 MG PO TABS: ORAL_TABLET | ORAL | 0 refills | Status: DC

## 2018-03-01 NOTE — Assessment & Plan Note (Signed)
Under good control. Continue to monitor. Call with any concerns.  

## 2018-03-01 NOTE — Progress Notes (Signed)
BP 123/77 (BP Location: Left Arm, Patient Position: Sitting, Cuff Size: Large)   Pulse 86   Temp 98.2 F (36.8 C)   SpO2 96%    Subjective:    Patient ID: [name], female    DOB: [birth year], 67 y.o.   MRN: [id number]  HPI: [name] is a 67 y.o. female  Chief Complaint  Patient presents with  . Hypotension  . COPD   Not feeling any better. She notes that she has been coughing stuff up. Has been having rib pain. Had fevers at night- low grade. + SOB. Lots of DOE. Has been going down into the 60s on her pulse ox. Has been using her inhalers. Really not feeling well at all  HYPERTENSION- ankles starting swelling back up, so went back on her HCTZ Hypertension status: better  Satisfied with current treatment? yes Duration of hypertension: chronic BP monitoring frequency:  not checking BP medication side effects:  no Medication compliance: good compliance Previous BP meds:losartan, HCTZ Aspirin: yes Recurrent headaches: no Visual changes: no Palpitations: no Dyspnea: yes Chest pain: no Lower extremity edema: yes Dizzy/lightheaded: no   Relevant past medical, surgical, family and social history reviewed and updated as indicated. Interim medical history since our last visit reviewed. Allergies and medications reviewed and updated.  Review of Systems  Constitutional: Positive for chills, fatigue and fever. Negative for activity change, appetite change, diaphoresis and unexpected weight change.  HENT: Negative.   Respiratory: Positive for cough, chest tightness, shortness of breath and wheezing. Negative for apnea, choking and stridor.   Cardiovascular: Negative.   Gastrointestinal: Negative.   Musculoskeletal: Positive for myalgias. Negative for arthralgias, back pain, gait problem, joint swelling, neck pain and neck stiffness.  Skin: Negative.   Psychiatric/Behavioral: Negative.     Per HPI unless specifically indicated above     Objective:    BP  123/77 (BP Location: Left Arm, Patient Position: Sitting, Cuff Size: Large)   Pulse 86   Temp 98.2 F (36.8 C)   SpO2 96%   Wt Readings from Last 3 Encounters:  01/03/18 214 lb (97.1 kg)  07/22/17 214 lb 8 oz (97.3 kg)  07/22/17 214 lb 12.8 oz (97.4 kg)    Physical Exam  Constitutional: She is oriented to person, place, and time. She appears well-developed and well-nourished. No distress.  HENT:  Head: Normocephalic and atraumatic.  Right Ear: Hearing and external ear normal.  Left Ear: Hearing and external ear normal.  Nose: Nose normal.  Mouth/Throat: Oropharynx is clear and moist. No oropharyngeal exudate.  Eyes: Pupils are equal, round, and reactive to light. Conjunctivae, EOM and lids are normal. Right eye exhibits no discharge. Left eye exhibits no discharge. No scleral icterus.  Neck: Normal range of motion. Neck supple. No JVD present. No tracheal deviation present. No thyromegaly present.  Cardiovascular: Regular rhythm, normal heart sounds and intact distal pulses. Exam reveals no gallop and no friction rub.  No murmur heard. Pulmonary/Chest: Effort normal. No stridor. No respiratory distress. She has decreased breath sounds in the right upper field, the right middle field, the right lower field, the left upper field, the left middle field and the left lower field. She has no wheezes. She has no rales. She exhibits no tenderness.  Musculoskeletal: Normal range of motion.  Lymphadenopathy:    She has no cervical adenopathy.  Neurological: She is alert and oriented to person, place, and time.  Skin: Skin is warm, dry and intact. Capillary refill  takes less than 2 seconds. No rash noted. She is not diaphoretic. No erythema. No pallor.  Psychiatric: She has a normal mood and affect. Her speech is normal and behavior is normal. Judgment and thought content normal. Cognition and memory are normal.  Nursing note and vitals reviewed.   Results for orders placed or performed in visit  on 02/14/18  Bayer DCA Hb A1c Waived  Result Value Ref Range   HB A1C (BAYER DCA - WAIVED) 6.1 <7.0 %      Assessment & Plan:   Problem List Items Addressed This Visit      Respiratory   COPD (chronic obstructive pulmonary disease) (HCC) - Primary    Not under good control. Lungs so tight, can barely hear anything. Will obtain x-ray and start prednisone taper. Recheck 10-14 days. Will treat with abx pending CXR result.      Relevant Medications   predniSONE (DELTASONE) 10 MG tablet   Other Relevant Orders   DG Chest 2 View     Genitourinary   Benign hypertensive renal disease    Under good control. Continue to monitor. Call with any concerns.           Follow up plan: Return in about 2 weeks (around 03/15/2018) for lung recheck.

## 2018-03-01 NOTE — Assessment & Plan Note (Signed)
Not under good control. Lungs so tight, can barely hear anything. Will obtain x-ray and start prednisone taper. Recheck 10-14 days. Will treat with abx pending CXR result.

## 2018-03-02 ENCOUNTER — Other Ambulatory Visit: Payer: Self-pay | Admitting: Family Medicine

## 2018-03-02 ENCOUNTER — Ambulatory Visit
Admission: RE | Admit: 2018-03-02 | Discharge: 2018-03-02 | Disposition: A | Discharge status: Home / Self Care | Payer: Medicare HMO | Source: Ambulatory Visit | Attending: Family Medicine | Admitting: Family Medicine

## 2018-03-02 ENCOUNTER — Encounter: Payer: Self-pay | Admitting: Family Medicine

## 2018-03-02 DIAGNOSIS — I700 Atherosclerosis of aorta: Secondary | ICD-10-CM | POA: Diagnosis not present

## 2018-03-02 DIAGNOSIS — J449 Chronic obstructive pulmonary disease, unspecified: Secondary | ICD-10-CM

## 2018-03-03 ENCOUNTER — Telehealth: Payer: Self-pay | Admitting: Family Medicine

## 2018-03-03 NOTE — Telephone Encounter (Signed)
Patient notified

## 2018-03-03 NOTE — Telephone Encounter (Signed)
Please let her know that her x-ray showed COPD nut not pneumonia, so she doesn't need an antibiotic right now, just the prednisone, but we may need to get her back into see the lung doctor if she continues to feel no better.

## 2018-03-03 NOTE — Telephone Encounter (Signed)
Copied from CRM [id number]. Topic: Quick Communication - Other Results >> Mar 02, 2018  2:08 PM Herby AbrahamJohnson, Shiquita C wrote: Pt called in for imaging results.  CB: [phone]

## 2018-03-03 NOTE — Telephone Encounter (Signed)
clonazepam refill Last Refill:11/04/17 # no  RF Last OV: 11/22/17 PCP: Olevia PerchesMegan Johnson DO Pharmacy:[facility] [address]  Albuterol neb solution refill Last Refill:01/31/18 # 300 ml No RF Last OV: 07/22/17 (has upcoming appt 03/11/18)

## 2018-03-11 ENCOUNTER — Encounter: Payer: Self-pay | Admitting: Family Medicine

## 2018-03-11 ENCOUNTER — Ambulatory Visit (INDEPENDENT_AMBULATORY_CARE_PROVIDER_SITE_OTHER): Payer: Medicare HMO | Admitting: Family Medicine

## 2018-03-11 VITALS — BP 122/74 | HR 87 | Temp 98.5°F

## 2018-03-11 DIAGNOSIS — J449 Chronic obstructive pulmonary disease, unspecified: Secondary | ICD-10-CM | POA: Diagnosis not present

## 2018-03-11 DIAGNOSIS — F419 Anxiety disorder, unspecified: Secondary | ICD-10-CM | POA: Diagnosis not present

## 2018-03-11 MED ORDER — ROPINIROLE HCL 0.5 MG PO TABS: 0.5000 mg | ORAL_TABLET | Freq: Three times a day (TID) | ORAL | 1 refills | Status: DC

## 2018-03-11 NOTE — Assessment & Plan Note (Signed)
Needs refill on her requip. Rx given today.

## 2018-03-11 NOTE — Progress Notes (Signed)
   BP 122/74 (BP Location: Left Arm, Patient Position: Sitting, Cuff Size: Normal)   Pulse 87   Temp 98.5 F (36.9 C)   SpO2 91%    Subjective:    Patient ID: [name], female    DOB: [birth year], 67 y.o.   MRN: [id number]  HPI: [name] is a 67 y.o. female  Chief Complaint  Patient presents with  . Lung Recheck   50% better in terms of her lungs. No problems with any fevers or chills. Still coughing. No other concerns at this time.   Relevant past medical, surgical, family and social history reviewed and updated as indicated. Interim medical history since our last visit reviewed. Allergies and medications reviewed and updated.  Review of Systems  Constitutional: Negative.   Respiratory: Positive for cough, shortness of breath and wheezing. Negative for apnea, choking, chest tightness and stridor.   Cardiovascular: Negative.   Psychiatric/Behavioral: Negative.     Per HPI unless specifically indicated above     Objective:    BP 122/74 (BP Location: Left Arm, Patient Position: Sitting, Cuff Size: Normal)   Pulse 87   Temp 98.5 F (36.9 C)   SpO2 91%   Wt Readings from Last 3 Encounters:  01/03/18 214 lb (97.1 kg)  07/22/17 214 lb 8 oz (97.3 kg)  07/22/17 214 lb 12.8 oz (97.4 kg)    Physical Exam  Constitutional: She is oriented to person, place, and time. She appears well-developed and well-nourished. No distress.  HENT:  Head: Normocephalic and atraumatic.  Right Ear: Hearing normal.  Left Ear: Hearing normal.  Nose: Nose normal.  Eyes: Conjunctivae and lids are normal. Right eye exhibits no discharge. Left eye exhibits no discharge. No scleral icterus.  Cardiovascular: Normal rate, regular rhythm, normal heart sounds and intact distal pulses. Exam reveals no gallop and no friction rub.  No murmur heard. Pulmonary/Chest: Effort normal. No stridor. No respiratory distress. She has decreased breath sounds in the right upper field, the right middle  field, the right lower field, the left upper field, the left middle field and the left lower field. She has no wheezes. She has no rales. She exhibits no tenderness.  Musculoskeletal: Normal range of motion.  Neurological: She is alert and oriented to person, place, and time.  Skin: Skin is warm, dry and intact. Capillary refill takes less than 2 seconds. No rash noted. She is not diaphoretic. No erythema. No pallor.  Psychiatric: She has a normal mood and affect. Her speech is normal and behavior is normal. Judgment and thought content normal. Cognition and memory are normal.  Nursing note and vitals reviewed.   Results for orders placed or performed in visit on 02/14/18  Bayer DCA Hb A1c Waived  Result Value Ref Range   HB A1C (BAYER DCA - WAIVED) 6.1 <7.0 %      Assessment & Plan:   Problem List Items Addressed This Visit      Respiratory   COPD (chronic obstructive pulmonary disease) (HCC) - Primary    Lungs clear today. Doing better. Continue inhalers. Continue to monitor.         Other   Anxiety    Needs refill on her requip. Rx given today.       Relevant Medications   rOPINIRole (REQUIP) 0.5 MG tablet       Follow up plan: Return August.

## 2018-03-11 NOTE — Assessment & Plan Note (Signed)
Lungs clear today. Doing better. Continue inhalers. Continue to monitor.

## 2018-03-21 ENCOUNTER — Other Ambulatory Visit: Payer: Self-pay | Admitting: Family Medicine

## 2018-03-21 DIAGNOSIS — I129 Hypertensive chronic kidney disease with stage 1 through stage 4 chronic kidney disease, or unspecified chronic kidney disease: Secondary | ICD-10-CM

## 2018-03-24 ENCOUNTER — Telehealth: Payer: Self-pay | Admitting: Family Medicine

## 2018-03-24 NOTE — Telephone Encounter (Signed)
Please review for refills for atenolol 50 mg tab   Last refilled on 11/22/17 for # 90 tabs hydrochlorothiazide 25 mg   Prescribed by historical provider.  Last refilled on 03/01/18?   LOV  03/11/18 NOV  05/06/18  Olevia PerchesMegan Johnson, DO  [facility]

## 2018-03-24 NOTE — Telephone Encounter (Unsigned)
Copied from CRM [id number]. Topic: Quick Communication - Rx Refill/Question >> Mar 24, 2018 11:23 AM Gaynelle AduPoole, Shalonda wrote: Medication:hydrochlorothiazide (HYDRODIURIL) 25 MG tablet      Preferred Pharmacy:[facility] [place] [id number] [phone] (Phone) [phone] (Fax)

## 2018-03-24 NOTE — Telephone Encounter (Signed)
Order placed

## 2018-04-06 ENCOUNTER — Ambulatory Visit: Payer: Self-pay

## 2018-04-06 NOTE — Telephone Encounter (Signed)
Patient called in with c/o "dizziness." She says "it started 2 days ago, I just feel loopy headed. The room spins when I move my head or even my eyes from side to side. I had this years ago when I had my daughter and I was put on Sudafed and Meclizine. I'm taking Sudafed now, but don't have the Meclizine." I asked about other symptoms, she denies. According to protocol, see PCP within 24 hours, appointment scheduled for tomorrow at 1115 with Dr. Laural BenesJohnson, care advice given, patient verbalized understanding.  Reason for Disposition . [1] MODERATE dizziness (e.g., interferes with normal activities) AND [2] has NOT been evaluated by physician for this  (Exception: dizziness caused by heat exposure, sudden standing, or poor fluid intake)  Answer Assessment - Initial Assessment Questions 1. DESCRIPTION: "Describe your dizziness."     I feel loopy headed 2. LIGHTHEADED: "Do you feel lightheaded?" (e.g., somewhat faint, woozy, weak upon standing)     Lightheaded 3. VERTIGO: "Do you feel like either you or the room is spinning or tilting?" (i.e. vertigo)     Yes 4. SEVERITY: "How bad is it?"  "Do you feel like you are going to faint?" "Can you stand and walk?"   - MILD - walking normally   - MODERATE - interferes with normal activities (e.g., work, school)    - SEVERE - unable to stand, requires support to walk, feels like passing out now.      Moderate 5. ONSET:  "When did the dizziness begin?"     2 days ago 6. AGGRAVATING FACTORS: "Does anything make it worse?" (e.g., standing, change in head position)     Change in head position, moving eyes left to right 7. HEART RATE: "Can you tell me your heart rate?" "How many beats in 15 seconds?"  (Note: not all patients can do this)       80's yesterday 8. CAUSE: "What do you think is causing the dizziness?"     I don't know 9. RECURRENT SYMPTOM: "Have you had dizziness before?" If so, ask: "When was the last time?" "What happened that time?"     Years  ago when I had my daughter [id number]. OTHER SYMPTOMS: "Do you have any other symptoms?" (e.g., fever, chest pain, vomiting, diarrhea, bleeding)       No 11. PREGNANCY: "Is there any chance you are pregnant?" "When was your last menstrual period?"       No  Protocols used: DIZZINESS [facility]- LIGHTHEADEDNESS-A-AH

## 2018-04-07 ENCOUNTER — Encounter: Payer: Self-pay | Admitting: Family Medicine

## 2018-04-07 ENCOUNTER — Other Ambulatory Visit: Payer: Self-pay | Admitting: Family Medicine

## 2018-04-07 ENCOUNTER — Ambulatory Visit (INDEPENDENT_AMBULATORY_CARE_PROVIDER_SITE_OTHER): Payer: Medicare HMO | Admitting: Family Medicine

## 2018-04-07 VITALS — BP 104/72 | HR 84 | Temp 98.2°F | Wt 213.0 lb

## 2018-04-07 DIAGNOSIS — H8113 Benign paroxysmal vertigo, bilateral: Secondary | ICD-10-CM | POA: Diagnosis not present

## 2018-04-07 DIAGNOSIS — J449 Chronic obstructive pulmonary disease, unspecified: Secondary | ICD-10-CM | POA: Diagnosis not present

## 2018-04-07 DIAGNOSIS — F419 Anxiety disorder, unspecified: Secondary | ICD-10-CM

## 2018-04-07 MED ORDER — ALBUTEROL SULFATE HFA 108 (90 BASE) MCG/ACT IN AERS: 1.0000 | INHALATION_SPRAY | RESPIRATORY_TRACT | 6 refills | Status: DC | PRN

## 2018-04-07 MED ORDER — MECLIZINE HCL 25 MG PO TABS: 25.0000 mg | ORAL_TABLET | Freq: Three times a day (TID) | ORAL | 3 refills | Status: DC | PRN

## 2018-04-07 MED ORDER — ALBUTEROL SULFATE HFA 108 (90 BASE) MCG/ACT IN AERS: 1.0000 | INHALATION_SPRAY | RESPIRATORY_TRACT | 1 refills | Status: DC | PRN

## 2018-04-07 NOTE — Assessment & Plan Note (Signed)
Under good control. Continue current regimen. Continue to monitor. Call with any concerns. 

## 2018-04-07 NOTE — Patient Instructions (Signed)
Vertigo Vertigo is the feeling that you or your surroundings are moving when they are not. Vertigo can be dangerous if it occurs while you are doing something that could endanger you or others, such as driving. What are the causes? This condition is caused by a disturbance in the signals that are sent by your body's sensory systems to your brain. Different causes of a disturbance can lead to vertigo, including:  Infections, especially in the inner ear.  A bad reaction to a drug, or misuse of alcohol and medicines.  Withdrawal from drugs or alcohol.  Quickly changing positions, as when lying down or rolling over in bed.  Migraine headaches.  Decreased blood flow to the brain.  Decreased blood pressure.  Increased pressure in the brain from a head or neck injury, stroke, infection, tumor, or bleeding.  Central nervous system disorders.  What are the signs or symptoms? Symptoms of this condition usually occur when you move your head or your eyes in different directions. Symptoms may start suddenly, and they usually last for less than a minute. Symptoms may include:  Loss of balance and falling.  Feeling like you are spinning or moving.  Feeling like your surroundings are spinning or moving.  Nausea and vomiting.  Blurred vision or double vision.  Difficulty hearing.  Slurred speech.  Dizziness.  Involuntary eye movement (nystagmus).  Symptoms can be mild and cause only slight annoyance, or they can be severe and interfere with daily life. Episodes of vertigo may return (recur) over time, and they are often triggered by certain movements. Symptoms may improve over time. How is this diagnosed? This condition may be diagnosed based on medical history and the quality of your nystagmus. Your health care provider may test your eye movements by asking you to quickly change positions to trigger the nystagmus. This may be called the Dix-Hallpike test, head thrust test, or roll test.  You may be referred to a health care provider who specializes in ear, nose, and throat (ENT) problems (otolaryngologist) or a provider who specializes in disorders of the central nervous system (neurologist). You may have additional testing, including:  A physical exam.  Blood tests.  MRI.  A CT scan.  An electrocardiogram (ECG). This records electrical activity in your heart.  An electroencephalogram (EEG). This records electrical activity in your brain.  Hearing tests.  How is this treated? Treatment for this condition depends on the cause and the severity of the symptoms. Treatment options include:  Medicines to treat nausea or vertigo. These are usually used for severe cases. Some medicines that are used to treat other conditions may also reduce or eliminate vertigo symptoms. These include: ? Medicines that control allergies (antihistamines). ? Medicines that control seizures (anticonvulsants). ? Medicines that relieve depression (antidepressants). ? Medicines that relieve anxiety (sedatives).  Head movements to adjust your inner ear back to normal. If your vertigo is caused by an ear problem, your health care provider may recommend certain movements to correct the problem.  Surgery. This is rare.  Follow these instructions at home: Safety  Move slowly.Avoid sudden body or head movements.  Avoid driving.  Avoid operating heavy machinery.  Avoid doing any tasks that would cause danger to you or others if you would have a vertigo episode during the task.  If you have trouble walking or keeping your balance, try using a cane for stability. If you feel dizzy or unstable, sit down right away.  Return to your normal activities as told by your   health care provider. Ask your health care provider what activities are safe for you. General instructions  Take over-the-counter and prescription medicines only as told by your health care provider.  Avoid certain positions or  movements as told by your health care provider.  Drink enough fluid to keep your urine clear or pale yellow.  Keep all follow-up visits as told by your health care provider. This is important. Contact a health care provider if:  Your medicines do not relieve your vertigo or they make it worse.  You have a fever.  Your condition gets worse or you develop new symptoms.  Your family or friends notice any behavioral changes.  Your nausea or vomiting gets worse.  You have numbness or a "pins and needles" sensation in part of your body. Get help right away if:  You have difficulty moving or speaking.  You are always dizzy.  You faint.  You develop severe headaches.  You have weakness in your hands, arms, or legs.  You have changes in your hearing or vision.  You develop a stiff neck.  You develop sensitivity to light. This information is not intended to replace advice given to you by your health care provider. Make sure you discuss any questions you have with your health care provider. Document Released: 06/24/2005 Document Revised: 02/26/2016 Document Reviewed: 01/07/2015 Elsevier Interactive Patient Education  2018 [employer] How to Perform the Epley Maneuver The Epley maneuver is an exercise that relieves symptoms of vertigo. Vertigo is the feeling that you or your surroundings are moving when they are not. When you feel vertigo, you may feel like the room is spinning and have trouble walking. Dizziness is a little different than vertigo. When you are dizzy, you may feel unsteady or light-headed. You can do this maneuver at home whenever you have symptoms of vertigo. You can do it up to 3 times a day until your symptoms go away. Even though the Epley maneuver may relieve your vertigo for a few weeks, it is possible that your symptoms will return. This maneuver relieves vertigo, but it does not relieve dizziness. What are the risks? If it is done correctly, the Epley  maneuver is considered safe. Sometimes it can lead to dizziness or nausea that goes away after a short time. If you develop other symptoms, such as changes in vision, weakness, or numbness, stop doing the maneuver and call your health care provider. How to perform the Epley maneuver 1. Sit on the edge of a bed or table with your back straight and your legs extended or hanging over the edge of the bed or table. 2. Turn your head halfway toward the affected ear or side. 3. Lie backward quickly with your head turned until you are lying flat on your back. You may want to position a pillow under your shoulders. 4. Hold this position for 30 seconds. You may experience an attack of vertigo. This is normal. 5. Turn your head to the opposite direction until your unaffected ear is facing the floor. 6. Hold this position for 30 seconds. You may experience an attack of vertigo. This is normal. Hold this position until the vertigo stops. 7. Turn your whole body to the same side as your head. Hold for another 30 seconds. 8. Sit back up. You can repeat this exercise up to 3 times a day. Follow these instructions at home:  After doing the Epley maneuver, you can return to your normal activities.  Ask your health care provider   if there is anything you should do at home to prevent vertigo. He or she may recommend that you: ? Keep your head raised (elevated) with two or more pillows while you sleep. ? Do not sleep on the side of your affected ear. ? Get up slowly from bed. ? Avoid sudden movements during the day. ? Avoid extreme head movement, like looking up or bending over. Contact a health care provider if:  Your vertigo gets worse.  You have other symptoms, including: ? Nausea. ? Vomiting. ? Headache. Get help right away if:  You have vision changes.  You have a severe or worsening headache or neck pain.  You cannot stop vomiting.  You have new numbness or weakness in any part of your  body. Summary  Vertigo is the feeling that you or your surroundings are moving when they are not.  The Epley maneuver is an exercise that relieves symptoms of vertigo.  If the Epley maneuver is done correctly, it is considered safe. You can do it up to 3 times a day. This information is not intended to replace advice given to you by your health care provider. Make sure you discuss any questions you have with your health care provider. Document Released: 09/19/2013 Document Revised: 08/04/2016 Document Reviewed: 08/04/2016 Elsevier Interactive Patient Education  2017 Elsevier Inc.  

## 2018-04-07 NOTE — Progress Notes (Signed)
BP 104/72 (BP Location: Left Arm, Patient Position: Sitting, Cuff Size: Large)   Pulse 84   Temp 98.2 F (36.8 C)   Wt 213 lb (96.6 kg) Comment: Patient reported  SpO2 90%   BMI 41.60 kg/m    Subjective:    Patient ID: [name], female    DOB: 07/31/1951, 67 y.o.   MRN: [id number]  HPI: [name] is a 67 y.o. female  Chief Complaint  Patient presents with  . Dizziness    Patient would like a prescription for meclizine  . Anxiety    refill on klonopin  . COPD    refill on albuterol neb   DIZZINESS- has had known vertigo for years. She has not had issues in several years, but it restarted a couple of days ago Duration: 2-3 days ago Description of symptoms: room spinning Duration of episode: minutes Dizziness frequency: recurrent Provoking factors: moving her head Aggravating factors:  Moving her head Triggered by rolling over in bed: yes Triggered by bending over: yes Aggravated by head movement: yes Aggravated by exertion, coughing, loud noises: no Recent head injury: no Recent or current viral symptoms: no History of vasovagal episodes: no Nausea: yes Vomiting: no Tinnitus: no Hearing loss: no Aural fullness: yes Headache: no Photophobia/phonophobia: no Unsteady gait: no Postural instability: no Diplopia, dysarthria, dysphagia or weakness: no Related to exertion: no Pallor: no Diaphoresis: no Dyspnea: no Chest pain: no  Relevant past medical, surgical, family and social history reviewed and updated as indicated. Interim medical history since our last visit reviewed. Allergies and medications reviewed and updated.  Review of Systems  Constitutional: Negative.   HENT: Positive for hearing loss. Negative for congestion, dental problem, drooling, ear discharge, ear pain, facial swelling, mouth sores, nosebleeds, postnasal drip, rhinorrhea, sinus pressure, sinus pain, sneezing, sore throat, tinnitus, trouble swallowing and voice change.   Eyes:  Negative.   Respiratory: Negative.   Cardiovascular: Negative.   Neurological: Positive for dizziness. Negative for tremors, seizures, syncope, facial asymmetry, speech difficulty, weakness, light-headedness, numbness and headaches.  Hematological: Negative.   Psychiatric/Behavioral: Negative.     Per HPI unless specifically indicated above     Objective:    BP 104/72 (BP Location: Left Arm, Patient Position: Sitting, Cuff Size: Large)   Pulse 84   Temp 98.2 F (36.8 C)   Wt 213 lb (96.6 kg) Comment: Patient reported  SpO2 90%   BMI 41.60 kg/m   Wt Readings from Last 3 Encounters:  04/07/18 213 lb (96.6 kg)  01/03/18 214 lb (97.1 kg)  07/22/17 214 lb 8 oz (97.3 kg)    Physical Exam  Constitutional: She is oriented to person, place, and time. She appears well-developed and well-nourished. No distress.  HENT:  Head: Normocephalic and atraumatic.  Right Ear: Hearing normal.  Left Ear: Hearing normal.  Nose: Nose normal.  Eyes: Conjunctivae and lids are normal. Right eye exhibits no discharge. Left eye exhibits no discharge. No scleral icterus. Right eye exhibits nystagmus. Right eye exhibits normal extraocular motion. Left eye exhibits nystagmus. Left eye exhibits normal extraocular motion.  Cardiovascular: Normal rate, regular rhythm, normal heart sounds and intact distal pulses. Exam reveals no gallop and no friction rub.  No murmur heard. Pulmonary/Chest: Effort normal and breath sounds normal. No stridor. No respiratory distress. She has no wheezes. She has no rales. She exhibits no tenderness.  Musculoskeletal: Normal range of motion.  Neurological: She is alert and oriented to person, place, and time.  Skin: Skin is warm, dry and intact. Capillary refill takes less than 2 seconds. No rash noted. She is not diaphoretic. No erythema. No pallor.  Psychiatric: She has a normal mood and affect. Her speech is normal and behavior is normal. Judgment and thought content normal.  Cognition and memory are normal.  Nursing note and vitals reviewed.   Results for orders placed or performed in visit on 02/14/18  Bayer DCA Hb A1c Waived  Result Value Ref Range   HB A1C (BAYER DCA - WAIVED) 6.1 <7.0 %      Assessment & Plan:   Problem List Items Addressed This Visit      Respiratory   COPD (chronic obstructive pulmonary disease) (HCC)    Under good control. Continue current regimen. Continue to monitor. Call with any concerns.       Relevant Medications   albuterol (PROVENTIL HFA;VENTOLIN HFA) 108 (90 Base) MCG/ACT inhaler     Other   Anxiety    Klonopin at the pharmacy- patient encouraged to pick it up. Not due for a refill until September.        Other Visit Diagnoses    Benign paroxysmal positional vertigo due to bilateral vestibular disorder    -  Primary   Will start Epley's Mannuver and meclizine. Call with any concerns or if not getting better. Call with any concerns.        Follow up plan: Return As scheduled.

## 2018-04-07 NOTE — Assessment & Plan Note (Signed)
Klonopin at the pharmacy- patient encouraged to pick it up. Not due for a refill until September.

## 2018-04-07 NOTE — Telephone Encounter (Signed)
Medication: albuterol (PROVENTIL) (2.5 MG/3ML) 0.083% nebulizer solution - pt states the nebulizer solution wasn't sent to pharmacy Preferred Pharmacy (with phone number or street name): [phone] [place] [address] [facility] [address] [phone] (Phone) [phone] (Fax)

## 2018-04-11 ENCOUNTER — Telehealth: Payer: Self-pay | Admitting: Family Medicine

## 2018-04-11 ENCOUNTER — Encounter: Payer: Self-pay | Admitting: Family Medicine

## 2018-04-11 NOTE — Telephone Encounter (Signed)
Copied from CRM [id number]. Topic: Quick Communication - See Telephone Encounter >> Apr 11, 2018  4:14 PM Floria RavelingStovall, Shana A wrote: CRM for notification. See Telephone encounter for: 04/11/18.  Per pt she is needing a refill on the albuterol (PROVENTIL HFA;VENTOLIN HFA) 108 (90 Base) MCG/ACT inhaler [[id number]][[id number]] but per her ins it has to be changed to 18gram canisters and 6 canister for a 90 day supply sent to [phone]  Pt stated she will be out this week .  Best number for pt [phone]

## 2018-04-12 MED ORDER — ALBUTEROL SULFATE HFA 108 (90 BASE) MCG/ACT IN AERS: 1.0000 | INHALATION_SPRAY | RESPIRATORY_TRACT | 6 refills | Status: DC | PRN

## 2018-04-21 ENCOUNTER — Telehealth: Payer: Self-pay | Admitting: Family Medicine

## 2018-04-21 NOTE — Telephone Encounter (Signed)
Copied from CRM [id number]. Topic: Quick Communication - See Telephone Encounter >> Apr 21, 2018  8:42 AM Oneal GroutSebastian, Jennifer S wrote: CRM for notification. See Telephone encounter for: 04/21/18. [facility] calling regarding oxygen order, patient will need qualify testing for portability.

## 2018-04-21 NOTE — Telephone Encounter (Signed)
Can you find out what they need? Then we can get it for them. Thanks!

## 2018-04-22 NOTE — Telephone Encounter (Signed)
Spoke with [facility], they need a walk test with a recorded oxygen of 88% or below.   She has an appointment on 05/06/18, can this be done at that appointment?

## 2018-04-22 NOTE — Telephone Encounter (Signed)
Yes. We can do it then.

## 2018-05-03 ENCOUNTER — Other Ambulatory Visit: Payer: Self-pay | Admitting: Family Medicine

## 2018-05-06 ENCOUNTER — Ambulatory Visit (INDEPENDENT_AMBULATORY_CARE_PROVIDER_SITE_OTHER): Payer: Medicare HMO | Admitting: Family Medicine

## 2018-05-06 ENCOUNTER — Encounter: Payer: Self-pay | Admitting: Family Medicine

## 2018-05-06 VITALS — BP 123/74 | HR 83 | Temp 98.4°F

## 2018-05-06 DIAGNOSIS — I129 Hypertensive chronic kidney disease with stage 1 through stage 4 chronic kidney disease, or unspecified chronic kidney disease: Secondary | ICD-10-CM | POA: Diagnosis not present

## 2018-05-06 DIAGNOSIS — G2581 Restless legs syndrome: Secondary | ICD-10-CM

## 2018-05-06 DIAGNOSIS — E782 Mixed hyperlipidemia: Secondary | ICD-10-CM

## 2018-05-06 DIAGNOSIS — N182 Chronic kidney disease, stage 2 (mild): Secondary | ICD-10-CM

## 2018-05-06 DIAGNOSIS — J449 Chronic obstructive pulmonary disease, unspecified: Secondary | ICD-10-CM

## 2018-05-06 DIAGNOSIS — F419 Anxiety disorder, unspecified: Secondary | ICD-10-CM

## 2018-05-06 DIAGNOSIS — E119 Type 2 diabetes mellitus without complications: Secondary | ICD-10-CM

## 2018-05-06 DIAGNOSIS — K439 Ventral hernia without obstruction or gangrene: Secondary | ICD-10-CM

## 2018-05-06 LAB — BAYER DCA HB A1C WAIVED: HB A1C (BAYER DCA - WAIVED): 6.3 % (ref <7.0)

## 2018-05-06 MED ORDER — GABAPENTIN 100 MG PO CAPS: 200.0000 mg | ORAL_CAPSULE | Freq: Four times a day (QID) | ORAL | 3 refills | Status: DC

## 2018-05-06 MED ORDER — THEOPHYLLINE ER 400 MG PO TB24: 400.0000 mg | ORAL_TABLET | Freq: Every day | ORAL | 1 refills | Status: DC

## 2018-05-06 MED ORDER — PRAVASTATIN SODIUM 40 MG PO TABS: 40.0000 mg | ORAL_TABLET | Freq: Every day | ORAL | 1 refills | Status: DC

## 2018-05-06 MED ORDER — BUDESONIDE-FORMOTEROL FUMARATE 160-4.5 MCG/ACT IN AERO: 2.0000 | INHALATION_SPRAY | Freq: Two times a day (BID) | RESPIRATORY_TRACT | 4 refills | Status: DC

## 2018-05-06 MED ORDER — SERTRALINE HCL 100 MG PO TABS: 150.0000 mg | ORAL_TABLET | Freq: Every day | ORAL | 1 refills | Status: DC

## 2018-05-06 MED ORDER — TIOTROPIUM BROMIDE MONOHYDRATE 18 MCG IN CAPS: ORAL_CAPSULE | RESPIRATORY_TRACT | 1 refills | Status: DC

## 2018-05-06 NOTE — Assessment & Plan Note (Signed)
Under good control on current regimen. Continue current regimen. Continue to monitor. Call with any concerns. Refills given.   

## 2018-05-06 NOTE — Assessment & Plan Note (Signed)
Would like to discuss with surgeon. Referral generated today. Call with any concerns.

## 2018-05-06 NOTE — Progress Notes (Signed)
BP 123/74 (BP Location: Left Arm, Patient Position: Sitting, Cuff Size: Large)   Pulse 83   Temp 98.4 F (36.9 C)   SpO2 (!) 84%    Subjective:    Patient ID: [name], female    DOB: Oct 30, 1950, 67 y.o.   MRN: [id number]  HPI: [name] is a 67 y.o. female  Chief Complaint  Patient presents with  . Diabetes  . Hyperlipidemia  . Hypertension  . RLS   DIABETES Hypoglycemic episodes:no Polydipsia/polyuria: no Visual disturbance: no Chest pain: no Paresthesias: no Glucose Monitoring: no  Accucheck frequency: Not Checking Taking Insulin?: no Blood Pressure Monitoring: not checking Retinal Examination: Up to Date Foot Exam: Up to Date Diabetic Education: Completed Pneumovax: Up to Date Influenza: Up to Date Aspirin: yes  HYPERTENSION / HYPERLIPIDEMIA Satisfied with current treatment? yes Duration of hypertension: chronic BP monitoring frequency: not checking BP medication side effects: no Past BP meds: atenolol, hctz, losartan Duration of hyperlipidemia: chronic Cholesterol medication side effects: no Cholesterol supplements: none Past cholesterol medications: pravastatin Medication compliance: excellent compliance Aspirin: yes Recent stressors: yes Recurrent headaches: no Visual changes: no Palpitations: no Dyspnea: yes Chest pain: no Lower extremity edema: no Dizzy/lightheaded: no  RESTLESS LEGS Duration: chronic Discomfort description:  Creeping and crawling Pain: yes Location: calves Bilateral: yes Symmetric: yes Severity: moderate Onset:  gradual Frequency:  At night only Symptoms only occur while legs at rest: yes Sudden unintentional leg jerking: no Bed partner bothered by leg movements: no LE numbness: yes Decreased sensation: yes Weakness: yes Insomnia: yes Daytime somnolence: yes Fatigue: yes Status: stable  ANXIETY/STRESS Duration:stable Anxious mood: no  Excessive worrying: no Irritability: no  Sweating:  no Nausea: no Palpitations:no Hyperventilation: no Panic attacks: no Agoraphobia: no  Obscessions/compulsions: no Depressed mood: no Depression screen [facility] 2/9 02/14/2018 01/03/2018 11/22/2017 07/22/2017 04/06/2017  Decreased Interest 1 1 2  0 1  Down, Depressed, Hopeless 1 2 3 3 2   PHQ - 2 Score 2 3 5 3 3   Altered sleeping 2 2 3  0 2  Tired, decreased energy 3 2 3 3 2   Change in appetite 2 2 3  0 2  Feeling bad or failure about yourself  1 2 3  0 1  Trouble concentrating 2 2 3  0 1  Moving slowly or fidgety/restless 0 2 0 0 1  Suicidal thoughts 0 1 1 0 0  PHQ-9 Score 12 16 21 6 12   Difficult doing work/chores - Very difficult Somewhat difficult Somewhat difficult -  Some recent data might be hidden   Anhedonia: no Weight changes: no Insomnia: yes   Hypersomnia: no Fatigue/loss of energy: yes Feelings of worthlessness: no Feelings of guilt: no Impaired concentration/indecisiveness: no Suicidal ideations: no  Crying spells: no Recent Stressors/Life Changes: yes   Relationship problems: yes   Family stress: yes     Financial stress: yes    Job stress: no    Recent death/loss: no   Relevant past medical, surgical, family and social history reviewed and updated as indicated. Interim medical history since our last visit reviewed. Allergies and medications reviewed and updated.  Review of Systems  Constitutional: Negative.   Respiratory: Negative.   Cardiovascular: Negative.   Musculoskeletal: Negative.   Psychiatric/Behavioral: Negative.     Per HPI unless specifically indicated above     Objective:    BP 123/74 (BP Location: Left Arm, Patient Position: Sitting, Cuff Size: Large)   Pulse 83   Temp 98.4 F (36.9 C)  SpO2 (!) 84%   O2 Sat off oxygen: 77%, On 2L up to 88%, With 2 minute walk test off oxygen 84% Wt Readings from Last 3 Encounters:  04/07/18 213 lb (96.6 kg)  01/03/18 214 lb (97.1 kg)  07/22/17 214 lb 8 oz (97.3 kg)    Physical Exam  Constitutional:  She is oriented to person, place, and time. She appears well-developed and well-nourished. No distress.  HENT:  Head: Normocephalic and atraumatic.  Right Ear: Hearing normal.  Left Ear: Hearing normal.  Nose: Nose normal.  Eyes: Conjunctivae and lids are normal. Right eye exhibits no discharge. Left eye exhibits no discharge. No scleral icterus.  Cardiovascular: Normal rate, regular rhythm, normal heart sounds and intact distal pulses. Exam reveals no gallop and no friction rub.  No murmur heard. Pulmonary/Chest: Effort normal and breath sounds normal. No stridor. No respiratory distress. She has no wheezes. She has no rales. She exhibits no tenderness.  Musculoskeletal: Normal range of motion.  Neurological: She is alert and oriented to person, place, and time.  Skin: Skin is warm, dry and intact. Capillary refill takes less than 2 seconds. No rash noted. She is not diaphoretic. No erythema. No pallor.  Psychiatric: She has a normal mood and affect. Her speech is normal and behavior is normal. Judgment and thought content normal. Cognition and memory are normal.  Nursing note and vitals reviewed.   Results for orders placed or performed in visit on 02/14/18  Bayer DCA Hb A1c Waived  Result Value Ref Range   HB A1C (BAYER DCA - WAIVED) 6.1 <7.0 %      Assessment & Plan:   Problem List Items Addressed This Visit      Respiratory   COPD (chronic obstructive pulmonary disease) (HCC)    Under good control on current regimen. Continue current regimen. Continue to monitor. Call with any concerns. Refills given.        Relevant Medications   budesonide-formoterol (SYMBICORT) 160-4.5 MCG/ACT inhaler   theophylline (UNIPHYL) 400 MG 24 hr tablet   tiotropium (SPIRIVA HANDIHALER) 18 MCG inhalation capsule   Other Relevant Orders   CBC with Differential/Platelet   Comprehensive metabolic panel     Endocrine   Type 2 diabetes, diet controlled (HCC) - Primary    Under good control on  current regimen with A1c of 6.3. Continue current regimen. Continue to monitor. Call with any concerns. Refills given.        Relevant Medications   pravastatin (PRAVACHOL) 40 MG tablet   Other Relevant Orders   Bayer DCA Hb A1c Waived   CBC with Differential/Platelet   Comprehensive metabolic panel     Genitourinary   Benign hypertensive renal disease    Under good control on current regimen. Continue current regimen. Continue to monitor. Call with any concerns. Refills given.        Relevant Orders   CBC with Differential/Platelet   Comprehensive metabolic panel   Chronic kidney disease    Under good control on current regimen. Continue current regimen. Continue to monitor. Call with any concerns. Refills given.        Relevant Orders   CBC with Differential/Platelet   Comprehensive metabolic panel     Other   Anxiety    Under good control on current regimen. Continue current regimen. Continue to monitor. Call with any concerns. Refills given.        Relevant Medications   gabapentin (NEURONTIN) 100 MG capsule   sertraline (ZOLOFT) 100 MG tablet  Other Relevant Orders   CBC with Differential/Platelet   Comprehensive metabolic panel   Hyperlipidemia    Under good control on current regimen. Continue current regimen. Continue to monitor. Call with any concerns. Refills given.        Relevant Medications   pravastatin (PRAVACHOL) 40 MG tablet   Other Relevant Orders   CBC with Differential/Platelet   Comprehensive metabolic panel   Lipid Panel w/o Chol/HDL Ratio   Restless legs syndrome (RLS)    Stable on current regimen. Rechecking labs today. Await results. Call with any concerns.       Relevant Orders   CBC with Differential/Platelet   Comprehensive metabolic panel   Ferritin   Ventral hernia    Would like to discuss with surgeon. Referral generated today. Call with any concerns.       Relevant Orders   Ambulatory referral to General Surgery         Follow up plan: Return in about 3 months (around 08/06/2018).

## 2018-05-06 NOTE — Assessment & Plan Note (Signed)
Under good control on current regimen with A1c of 6.3. Continue current regimen. Continue to monitor. Call with any concerns. Refills given.   

## 2018-05-06 NOTE — Assessment & Plan Note (Signed)
Stable on current regimen. Rechecking labs today. Await results. Call with any concerns.

## 2018-05-07 LAB — CBC WITH DIFFERENTIAL/PLATELET
Basophils Absolute: 0 10*3/uL (ref 0.0–0.2)
Basos: 0 %
EOS (ABSOLUTE): 0.2 10*3/uL (ref 0.0–0.4)
Eos: 2 %
Hematocrit: 35 % (ref 34.0–46.6)
Hemoglobin: 11.1 g/dL (ref 11.1–15.9)
Immature Grans (Abs): 0 10*3/uL (ref 0.0–0.1)
Immature Granulocytes: 0 %
Lymphocytes Absolute: 1.1 10*3/uL (ref 0.7–3.1)
Lymphs: 11 %
MCH: 29.4 pg (ref 26.6–33.0)
MCHC: 31.7 g/dL (ref 31.5–35.7)
MCV: 93 fL (ref 79–97)
Monocytes Absolute: 0.6 10*3/uL (ref 0.1–0.9)
Monocytes: 6 %
Neutrophils Absolute: 8.5 10*3/uL — ABNORMAL HIGH (ref 1.4–7.0)
Neutrophils: 81 %
Platelets: 237 10*3/uL (ref 150–450)
RBC: 3.78 x10E6/uL (ref 3.77–5.28)
RDW: 14.3 % (ref 12.3–15.4)
WBC: 10.5 10*3/uL (ref 3.4–10.8)

## 2018-05-07 LAB — COMPREHENSIVE METABOLIC PANEL
ALT: 14 IU/L (ref 0–32)
AST: 15 IU/L (ref 0–40)
Albumin/Globulin Ratio: 2.1 (ref 1.2–2.2)
Albumin: 4.2 g/dL (ref 3.6–4.8)
Alkaline Phosphatase: 87 IU/L (ref 39–117)
BUN/Creatinine Ratio: 15 (ref 12–28)
BUN: 15 mg/dL (ref 8–27)
Bilirubin Total: <0.2 mg/dL (ref 0.0–1.2)
CO2: 34 mmol/L — ABNORMAL HIGH (ref 20–29)
Calcium: 9.8 mg/dL (ref 8.7–10.3)
Chloride: 95 mmol/L — ABNORMAL LOW (ref 96–106)
Creatinine, Ser: 1.03 mg/dL — ABNORMAL HIGH (ref 0.57–1.00)
GFR calc Af Amer: 65 mL/min/{1.73_m2} (ref >59)
GFR calc non Af Amer: 57 mL/min/{1.73_m2} — ABNORMAL LOW (ref >59)
Globulin, Total: 2 g/dL (ref 1.5–4.5)
Glucose: 112 mg/dL — ABNORMAL HIGH (ref 65–99)
Potassium: 3.9 mmol/L (ref 3.5–5.2)
Sodium: 148 mmol/L — ABNORMAL HIGH (ref 134–144)
Total Protein: 6.2 g/dL (ref 6.0–8.5)

## 2018-05-07 LAB — LIPID PANEL W/O CHOL/HDL RATIO
Cholesterol, Total: 161 mg/dL (ref 100–199)
HDL: 61 mg/dL (ref >39)
LDL Calculated: 82 mg/dL (ref 0–99)
Triglycerides: 89 mg/dL (ref 0–149)
VLDL Cholesterol Cal: 18 mg/dL (ref 5–40)

## 2018-05-07 LAB — FERRITIN: Ferritin: 24 ng/mL (ref 15–150)

## 2018-05-09 ENCOUNTER — Telehealth: Payer: Self-pay | Admitting: Family Medicine

## 2018-05-09 NOTE — Telephone Encounter (Signed)
I thought we did this- can we check on it

## 2018-05-09 NOTE — Telephone Encounter (Signed)
Copied from CRM [id number]. Topic: Quick Communication - See Telephone Encounter >> May 09, 2018  2:45 PM Maia Pettiesrtiz, Kristie S wrote: CRM for notification. See Telephone encounter for: 05/09/18.  Pt called and states that she contacted [facility] about getting portable oxygen equipment and they have not received an order from Dr. Laural BenesJOhnson. Fax # [phone]. Please advise.

## 2018-05-10 NOTE — Telephone Encounter (Signed)
Order faxed.

## 2018-05-13 ENCOUNTER — Telehealth: Payer: Self-pay | Admitting: Surgery

## 2018-05-13 NOTE — Telephone Encounter (Signed)
PATIENT CALLED & LEFT MESSAGE ON THE ANSWERING SERVICE STATING [employer] DID NOT LIST DR Barbara CowerJASON DAVIS AS AN IN NETWORK PROVIDER. I SPOKE WITH MAUREEN & SHE STATES AS LONG AS THE PATIENT HAS MEDICARE [facility] SHE WILL BE FINE. I CALLED & SPOKE TO PATIENT AND RELAYED THE ABOVE INFORMATION.

## 2018-05-17 ENCOUNTER — Ambulatory Visit (INDEPENDENT_AMBULATORY_CARE_PROVIDER_SITE_OTHER): Payer: Medicare HMO | Admitting: Surgery

## 2018-05-17 ENCOUNTER — Encounter: Payer: Self-pay | Admitting: Surgery

## 2018-05-17 VITALS — BP 128/75 | Temp 97.7°F | Ht 64.0 in | Wt 213.0 lb

## 2018-05-17 DIAGNOSIS — K439 Ventral hernia without obstruction or gangrene: Secondary | ICD-10-CM

## 2018-05-17 NOTE — Progress Notes (Signed)
Surgical Clinic History and Physical  Referring provider:  Dorcas CarrowJohnson, Megan P, DO [address] [place], [place] [id number]  HISTORY OF PRESENT ILLNESS (HPI):  67 y.o. female presents for evaluation of her umbilical hernia. Patient reports she first had her umbilical hernia repaired in 1984, though she's uncertain whether mesh was used. Following laparoscopic cholecystectomy "several" years ago, her supra-umbilical hernia has been causing her some discomfort, though she denies pain, particularly when she coughs or sneezes, both of which she attributes to seasonal allergies. She says her hernia seems to protrude more when she feels "bloated" after eating ice cream, though she denies routine/frequent constipation or any blood per rectum. She requires home supplemental oxygen 24 hours per day and continues to smoke "occasionally". She says she does not expect to be able to have surgery (was told by her primary care physician she is not a candidate for elective surgery due to her comorbidities), but she requested surgical consultation to discuss non-operative management options to alleviate discomfort and reduce complications.  requests information and non-operative management options.  PAST MEDICAL HISTORY (PMH):  Past Medical History:  Diagnosis Date  . Acute respiratory failure (HCC) 2015   twice  . Anxiety   . Chronic kidney disease   . COPD (chronic obstructive pulmonary disease) (HCC)   . Depression   . Diabetes mellitus without complication (HCC)   . Hyperlipidemia   . Hypertension   . Meniscus tear    right knee  . Obesity   . Restless leg syndrome   . Tobacco abuse      PAST SURGICAL HISTORY (PSH):  Past Surgical History:  Procedure Laterality Date  . CHOLECYSTECTOMY    . tumor removed       MEDICATIONS:  Prior to Admission medications   Medication Sig Start Date End Date Taking? Authorizing Provider  albuterol (PROVENTIL HFA;VENTOLIN HFA) 108 (90 Base) MCG/ACT inhaler Inhale 1-2  puffs into the lungs every 4 (four) hours as needed for wheezing or shortness of breath. 04/12/18  Yes Johnson, Megan P, DO  albuterol (PROVENTIL) (2.5 MG/3ML) 0.083% nebulizer solution USE 1 VIAL VIA NEBULIZER EVERY 6 HOURS AS NEEDED FOR WHEEZING OR SHORTNESS OF BREATH 05/03/18  Yes Johnson, Megan P, DO  aspirin EC 81 MG tablet Take 81 mg by mouth daily.   Yes [provider]  atenolol (TENORMIN) 50 MG tablet TAKE 1 TABLET (50 MG TOTAL) BY MOUTH DAILY. 03/24/18  Yes Johnson, Megan P, DO  B Complex Vitamins (B COMPLEX PO) Take 1 Dose by mouth as directed.   Yes [provider]  budesonide-formoterol (SYMBICORT) 160-4.5 MCG/ACT inhaler Inhale 2 puffs into the lungs 2 (two) times daily. 05/06/18  Yes Johnson, Megan P, DO  Calcium Carbonate-Vit D-Min (CALCIUM 1200 PO) Take by mouth.   Yes [provider]  clonazePAM (KLONOPIN) 1 MG tablet TAKE 1 TABLET BY MOUTH EVERY DAY AS NEEDED 03/04/18  Yes Johnson, Megan P, DO  diclofenac sodium (VOLTAREN) 1 % GEL Apply 4 g topically 4 (four) times daily. 01/13/18  Yes Johnson, Megan P, DO  gabapentin (NEURONTIN) 100 MG capsule Take 2 capsules (200 mg total) by mouth 4 (four) times daily. 05/06/18  Yes Johnson, Megan P, DO  hydrochlorothiazide (HYDRODIURIL) 25 MG tablet TAKE 1 TABLET EVERY DAY 03/24/18  Yes Johnson, Megan P, DO  Lipoic Acid 150 MG CAPS Take by mouth.   Yes [provider]  losartan (COZAAR) 25 MG tablet TAKE 1 TABLET EVERY DAY 01/27/18  Yes Johnson, [place] P,  DO  meclizine (ANTIVERT) 25 MG tablet Take 1 tablet (25 mg total) by mouth 3 (three) times daily as needed for dizziness. 04/07/18  Yes Johnson, Megan P, DO  Multiple Vitamins-Minerals (MULTIVITAMIN GUMMIES ADULTS PO) Take by mouth.   Yes [provider]  ONE TOUCH ULTRA TEST test strip TEST once daily as directed 02/23/17  Yes Crissman, Redge Gainer, MD  pravastatin (PRAVACHOL) 40 MG tablet Take 1 tablet (40 mg total) by mouth at bedtime. 05/06/18  Yes Johnson, Megan P, DO   rOPINIRole (REQUIP) 0.5 MG tablet Take 1 tablet (0.5 mg total) by mouth 3 (three) times daily. 03/11/18  Yes Johnson, Megan P, DO  sertraline (ZOLOFT) 100 MG tablet Take 1.5 tablets (150 mg total) by mouth daily. 05/06/18  Yes Johnson, Megan P, DO  theophylline (UNIPHYL) 400 MG 24 hr tablet Take 1 tablet (400 mg total) by mouth daily. 05/06/18  Yes Johnson, Megan P, DO  tiotropium (SPIRIVA HANDIHALER) 18 MCG inhalation capsule inhale the contents of one capsule in the handihaler once daily 05/06/18  Yes Johnson, Megan P, DO  TURMERIC PO Take 1 Dose by mouth.   Yes [provider]  Vitamin D, Cholecalciferol, 1000 units CAPS Take by mouth.   Yes [provider]     ALLERGIES:  Allergies  Allergen Reactions  . Percocet [Oxycodone-Acetaminophen] Nausea And Vomiting  . Chantix [Varenicline] Other (See Comments)    Severe headache and vomiting  . Fluoxetine Other (See Comments)    Headaches  . Lisinopril Cough  . Other     Other reaction(s): Unknown Contrast  . Verapamil Other (See Comments)    Lazy     SOCIAL HISTORY:  Social History   Socioeconomic History  . Marital status: Single    Spouse name: Not on file  . Number of children: Not on file  . Years of education: Not on file  . Highest education level: Not on file  Occupational History  . Not on file  Social Needs  . Financial resource strain: Not on file  . Food insecurity:    Worry: Not on file    Inability: Not on file  . Transportation needs:    Medical: Not on file    Non-medical: Not on file  Tobacco Use  . Smoking status: Current Every Day Smoker    Packs/day: 0.25    Types: Cigarettes  . Smokeless tobacco: Never Used  Substance and Sexual Activity  . Alcohol use: No  . Drug use: No  . Sexual activity: Never    Birth control/protection: None  Lifestyle  . Physical activity:    Days per week: Not on file    Minutes per session: Not on file  . Stress: Not on file  Relationships  . Social  connections:    Talks on phone: Not on file    Gets together: Not on file    Attends religious service: Not on file    Active member of club or organization: Not on file    Attends meetings of clubs or organizations: Not on file    Relationship status: Not on file  . Intimate partner violence:    Fear of current or ex partner: Not on file    Emotionally abused: Not on file    Physically abused: Not on file    Forced sexual activity: Not on file  Other Topics Concern  . Not on file  Social History Narrative  . Not on file    The patient  currently resides (home / rehab facility / nursing home): Home The patient normally is (ambulatory / bedbound): Minimally ambulatory due to pulmonary disability  FAMILY HISTORY:  Family History  Problem Relation Age of Onset  . Alcohol abuse Mother   . Heart disease Mother   . Hypertension Mother   . Mental illness Mother   . Stroke Mother   . Heart disease Father   . Hyperlipidemia Father   . Drug abuse Sister   . Migraines Sister   . Drug abuse Brother   . Alcohol abuse Brother   . Heart disease Brother   . Hyperlipidemia Brother   . Bipolar disorder Daughter   . Anxiety disorder Daughter   . Bipolar disorder Son   . Alcohol abuse Son   . Alcohol abuse Brother   . Drug abuse Brother   . Drug abuse Brother   . Alcohol abuse Brother   . Asthma Brother   . Raynaud syndrome Brother   . Heart disease Maternal Grandmother   . Diabetes Paternal Grandmother     Otherwise negative/non-contributory.  REVIEW OF SYSTEMS:  Constitutional: denies any other weight loss, fever, chills, or sweats  Eyes: denies any other vision changes, history of eye injury  ENT: denies sore throat, hearing problems  Respiratory: denies shortness of breath, wheezing  Cardiovascular: denies chest pain, palpitations  Gastrointestinal: abdominal pain, N/V, and bowel function as per HPI Musculoskeletal: denies any other joint pains or cramps  Skin: Denies any  other rashes or skin discolorations Neurological: denies any other headache, dizziness, weakness  Psychiatric: Denies any other depression, anxiety   All other review of systems were otherwise negative   VITAL SIGNS:  BP 128/75   Temp 97.7 F (36.5 C) (Oral)   Ht 5\' 4"  (1.626 m)   Wt 213 lb (96.6 kg)   BMI 36.56 kg/m   PHYSICAL EXAM:  Constitutional:  -- Obese body habitus  -- Awake, alert, and oriented x3  Eyes:  -- Pupils equally round and reactive to light  -- No scleral icterus  Ear, nose, throat:  -- No jugular venous distension -- No nasal drainage, bleeding Pulmonary:  -- No crackles  -- Equal breath sounds bilaterally -- Breathing non-labored at rest on supplemental oxygen via nasal canula Cardiovascular:  -- S1, S2 present  -- No pericardial rubs  Gastrointestinal:  -- Abdomen soft, nontender, non-distended, no guarding/rebound  -- Supra-umbilical hernia likely comprising bowel as well as fat that appears to be easily reducible and seems to have been reduced, though body habitus makes precise exam somewhat more challenging -- No other abdominal masses appreciated, pulsatile or otherwise  Musculoskeletal and Integumentary:  -- Wounds or skin discoloration: None appreciated -- Extremities: B/L UE and LE FROM, hands and feet warm  Neurologic:  -- Motor function: Intact and symmetric -- Sensation: Intact and symmetric  Labs:  CBC Latest Ref Rng & Units 05/06/2018 11/22/2017 04/06/2017  WBC 3.4 - 10.8 x10E3/uL 10.5 9.8 10.5  Hemoglobin 11.1 - 15.9 g/dL 16.1 09.6 04.5  Hematocrit 34.0 - 46.6 % 35.0 36.2 37.0  Platelets 150 - 450 x10E3/uL 237 270 255    Imaging studies: No new pertinent imaging studies available for review   Assessment/Plan:  67 y.o. female with a moderately symptomatic reducible ventral (recurrent umbilical vs post-incisional) hernia without evidence of obstruction, complicated by co-morbidities including morbid obesity (BMI 37), DM, HTN, HLD,  COPD on home supplemental oxygen, CKD, tobacco abuse (smoking), limited mobility, generalized anxiety disorder, and major depression  disorder.               - discussed with patient signs and symptoms of hernia incarceration and obstruction             - strategies for manual self-reduction of patient's supra-umbilical hernia were also reviewed and discussed  - maintain hydration with high fiber heart healthy diet to reduce/minimize constipation +/- daily stool softener as needed             - considering patient unlikely to be a candidate for elective repair of her ventral abdominal wall hernia, velcro abdominal binder may be utilized to provide relief when applied before getting out of bed in mornings             - return to clinic as needed, instructed to call if any questions or concerns  All of the above recommendations were discussed with the patient and patient's friend, and all of patient's and friend's questions were answered to their expressed satisfaction.  Thank you for the opportunity to participate in this patient's care.  -- Scherrie GerlachJason E. Earlene Plateravis, MD, RPVI [place]: [facility] - Partnering for exceptional care. Office: [phone]

## 2018-05-17 NOTE — Patient Instructions (Addendum)
Patient to wear a abdominal binder daily  The patient is aware to call back for any questions or concerns. Return as needed.

## 2018-05-23 ENCOUNTER — Telehealth: Payer: Self-pay | Admitting: Family Medicine

## 2018-05-23 NOTE — Telephone Encounter (Signed)
Copied from CRM [id number]. Topic: Quick Communication - Rx Refill/Question >> May 23, 2018  4:33 PM Mcneil, Ja-Kwan wrote: Medication: rOPINIRole (REQUIP) 0.5 MG tablet  Has the patient contacted their pharmacy? yes   Preferred Pharmacy (with phone number or street name): [facility] - [place] - [id number] [phone] (Phone) [phone] (Fax)  Agent: Please be advised that RX refills may take up to 3 business days. We ask that you follow-up with your pharmacy.

## 2018-05-23 NOTE — Telephone Encounter (Signed)
Called pt to notify pt that refill of requested medication was available at [facility].Pt states that she did contact [facility] and was told that refill was not available. Last prescription for medication was written on 03/11/18 #270 with 1 refill.   Joselyn Glassmanyler, pharmacy tech at [facility] contacted to see if prescription for Requip was available to be refilled for the pt. Joselyn Glassmanyler states that the pt has one remaining refill and it will be available to be filled on  05/27/18 and the pt should receive a notification that the medication is being filled. Pt notified and understanding verbalized.

## 2018-06-01 ENCOUNTER — Other Ambulatory Visit: Payer: Self-pay | Admitting: Family Medicine

## 2018-06-01 NOTE — Telephone Encounter (Signed)
Refill of Klonopin  LOV 05/06/18 Dr. Laural Benes  [facility] 03/04/18  #90 0 refills   [id number] - [place], [place] - [address] AT [facility] OF [address] & [address] [facility]

## 2018-06-02 ENCOUNTER — Other Ambulatory Visit: Payer: Self-pay | Admitting: Family Medicine

## 2018-06-02 NOTE — Telephone Encounter (Signed)
[facility] called and spoke to [place], Pensions consultant who says the prescription was received on 05/06/18 and what was sent was a 25 day supply.

## 2018-07-13 DIAGNOSIS — J449 Chronic obstructive pulmonary disease, unspecified: Secondary | ICD-10-CM | POA: Diagnosis not present

## 2018-07-19 DIAGNOSIS — J449 Chronic obstructive pulmonary disease, unspecified: Secondary | ICD-10-CM | POA: Diagnosis not present

## 2018-07-25 ENCOUNTER — Ambulatory Visit (INDEPENDENT_AMBULATORY_CARE_PROVIDER_SITE_OTHER): Payer: Medicare Other

## 2018-07-25 VITALS — BP 110/70 | HR 77 | Temp 98.4°F | Ht 64.0 in | Wt 214.0 lb

## 2018-07-25 DIAGNOSIS — Z0000 Encounter for general adult medical examination without abnormal findings: Secondary | ICD-10-CM | POA: Diagnosis not present

## 2018-07-25 DIAGNOSIS — Z23 Encounter for immunization: Secondary | ICD-10-CM | POA: Diagnosis not present

## 2018-07-25 NOTE — Progress Notes (Signed)
Subjective:   [name] is a 67 y.o. female who presents for Medicare Annual (Subsequent) preventive examination.  Review of Systems: Cardiac Risk Factors include: advanced age (>73men, >108 women);diabetes mellitus;dyslipidemia;obesity (BMI >30kg/m2)     Objective:     Vitals: BP 110/70   Pulse 77   Temp 98.4 F (36.9 C)   Ht 5\' 4"  (1.626 m)   Wt 214 lb (97.1 kg)   SpO2 99%   BMI 36.73 kg/m   Body mass index is 36.73 kg/m.  Advanced Directives 07/25/2018 07/22/2017 08/10/2016  Does Patient Have a Medical Advance Directive? No Yes No  Type of Advance Directive - Healthcare Power of [place];Living will -  Copy of Healthcare Power of Attorney in Chart? - No - copy requested -  Would patient like information on creating a medical advance directive? Yes (MAU/Ambulatory/Procedural Areas - Information given) - Yes - Educational materials given    Tobacco Social History   Tobacco Use  Smoking Status Former Smoker  . Packs/day: 1.00  . Years: 0.00  . Pack years: 0.00  . Types: Cigarettes, E-cigarettes  . Last attempt to quit: 12/20/1913  . Years since quitting: 104.6  Smokeless Tobacco Never Used  Tobacco Comment   Happy that I quit still miss it     Counseling given: Not Answered Comment: Happy that I quit still miss it   Clinical Intake:  Pre-visit preparation completed: Yes  Pain : 0-10 Pain Score: 0-No pain     Nutritional Status: BMI > 30  Obese Nutritional Risks: None Diabetes: Yes CBG done?: No Did pt. bring in CBG monitor from home?: No   Nutrition Risk Assessment:  Has the patient had any N/V/D within the last 2 months?  No  Does the patient have any non-healing wounds?  No  Has the patient had any unintentional weight loss or weight gain?  No   Diabetes:  Is the patient diabetic?  Yes  If diabetic, was a CBG obtained today?  No  Did the patient bring in their glucometer from home?  No  How often do you monitor your CBG's? As needed.     Financial Strains and Diabetes Management:  Are you having any financial strains with the device, your supplies or your medication? Yes .  Does the patient want to be seen by Chronic Care Management for management of their diabetes?  No  Would the patient like to be referred to a Nutritionist or for Diabetic Management?  No   Diabetic Exams:  Diabetic Eye Exam: Overdue for diabetic eye exam. Pt has been advised about the importance in completing this exam. Patient to call [facility] to set up appt.   Diabetic Foot Exam: Completed 07/22/17. Pt has been advised about the importance in completing this exam. Pt is scheduled for diabetic foot exam on 08/05/18.  How often do you need to have someone help you when you read instructions, pamphlets, or other written materials from your doctor or pharmacy?: 1 - Never What is the last grade level you completed in school?: some college  Interpreter Needed?: No  Information entered by :: Reather Littler LPN  Past Medical History:  Diagnosis Date  . Acute respiratory failure (HCC) 2015   twice  . Anxiety   . Chronic kidney disease   . COPD (chronic obstructive pulmonary disease) (HCC)   . Depression   . Diabetes mellitus without complication (HCC)   . Hyperlipidemia   . Hypertension   .  Meniscus tear    right knee  . Obesity   . Restless leg syndrome   . Tobacco abuse    Past Surgical History:  Procedure Laterality Date  . CHOLECYSTECTOMY    . HERNIA REPAIR  1984  . TUBAL LIGATION  1984  . tumor removed     Family History  Problem Relation Age of Onset  . Alcohol abuse Mother   . Heart disease Mother   . Hypertension Mother   . Mental illness Mother   . Stroke Mother   . Heart disease Father   . Hyperlipidemia Father   . Drug abuse Sister   . Migraines Sister   . Drug abuse Brother   . Alcohol abuse Brother   . Heart disease Brother   . Hyperlipidemia Brother   . Bipolar disorder Daughter   . Anxiety disorder  Daughter   . Bipolar disorder Son   . Alcohol abuse Son   . Alcohol abuse Brother   . Drug abuse Brother   . Drug abuse Brother   . Alcohol abuse Brother   . Asthma Brother   . Raynaud syndrome Brother   . Heart disease Maternal Grandmother   . Diabetes Paternal Grandmother    Social History   Socioeconomic History  . Marital status: Single    Spouse name: Not on file  . Number of children: Not on file  . Years of education: Not on file  . Highest education level: Not on file  Occupational History  . Occupation: retired  Engineer, production  . Financial resource strain: Somewhat hard  . Food insecurity:    Worry: Never true    Inability: Never true  . Transportation needs:    Medical: Yes    Non-medical: Yes  Tobacco Use  . Smoking status: Former Smoker    Packs/day: 1.00    Years: 0.00    Pack years: 0.00    Types: Cigarettes, E-cigarettes    Last attempt to quit: 12/20/1913    Years since quitting: 104.6  . Smokeless tobacco: Never Used  . Tobacco comment: Happy that I quit still miss it  Substance and Sexual Activity  . Alcohol use: No  . Drug use: No  . Sexual activity: Never    Birth control/protection: None  Lifestyle  . Physical activity:    Days per week: 0 days    Minutes per session: 0 min  . Stress: Patient refused  Relationships  . Social connections:    Talks on phone: More than three times a week    Gets together: Once a week    Attends religious service: Never    Active member of club or organization: Yes    Attends meetings of clubs or organizations: Never    Relationship status: Divorced  Other Topics Concern  . Not on file  Social History Narrative  . Not on file    Outpatient Encounter Medications as of 07/25/2018  Medication Sig  . albuterol (PROVENTIL HFA;VENTOLIN HFA) 108 (90 Base) MCG/ACT inhaler Inhale 1-2 puffs into the lungs every 4 (four) hours as needed for wheezing or shortness of breath.  Marland Kitchen albuterol (PROVENTIL) (2.5 MG/3ML)  0.083% nebulizer solution USE 1 VIAL VIA NEBULIZER EVERY 6 HOURS AS NEEDED FOR WHEEZING OR SHORTNESS OF BREATH  . aspirin EC 81 MG tablet Take 81 mg by mouth daily.  Marland Kitchen atenolol (TENORMIN) 50 MG tablet TAKE 1 TABLET (50 MG TOTAL) BY MOUTH DAILY.  . B Complex Vitamins (B COMPLEX PO) Take  1 Dose by mouth as directed.  . budesonide-formoterol (SYMBICORT) 160-4.5 MCG/ACT inhaler Inhale 2 puffs into the lungs 2 (two) times daily.  . Calcium Carbonate-Vit D-Min (CALCIUM 1200 PO) Take by mouth daily.   . clonazePAM (KLONOPIN) 1 MG tablet TAKE 1 TABLET BY MOUTH EVERY DAY AS NEEDED  . gabapentin (NEURONTIN) 100 MG capsule Take 2 capsules (200 mg total) by mouth 4 (four) times daily.  . hydrochlorothiazide (HYDRODIURIL) 25 MG tablet TAKE 1 TABLET EVERY DAY  . losartan (COZAAR) 25 MG tablet TAKE 1 TABLET EVERY DAY  . meclizine (ANTIVERT) 25 MG tablet Take 1 tablet (25 mg total) by mouth 3 (three) times daily as needed for dizziness.  . Multiple Vitamins-Minerals (MULTIVITAMIN GUMMIES ADULTS PO) Take by mouth.  . ONE TOUCH ULTRA TEST test strip TEST once daily as directed  . pravastatin (PRAVACHOL) 40 MG tablet Take 1 tablet (40 mg total) by mouth at bedtime.  Marland Kitchen rOPINIRole (REQUIP) 0.5 MG tablet Take 1 tablet (0.5 mg total) by mouth 3 (three) times daily.  . sertraline (ZOLOFT) 100 MG tablet Take 1.5 tablets (150 mg total) by mouth daily. (Patient taking differently: Take 100 mg by mouth daily. )  . theophylline (UNIPHYL) 400 MG 24 hr tablet Take 1 tablet (400 mg total) by mouth daily.  Marland Kitchen tiotropium (SPIRIVA HANDIHALER) 18 MCG inhalation capsule inhale the contents of one capsule in the handihaler once daily  . TURMERIC PO Take 1 Dose by mouth.  . Vitamin D, Cholecalciferol, 1000 units CAPS Take by mouth.  . diclofenac sodium (VOLTAREN) 1 % GEL Apply 4 g topically 4 (four) times daily. (Patient not taking: Reported on 07/25/2018)  . Lipoic Acid 150 MG CAPS Take by mouth.   No facility-administered  encounter medications on file as of 07/25/2018.     Activities of Daily Living In your present state of health, do you have any difficulty performing the following activities: 07/25/2018  Hearing? N  Comment declines hearing aids  Vision? N  Difficulty concentrating or making decisions? N  Walking or climbing stairs? Y  Comment out of breath easily   Dressing or bathing? N  Doing errands, shopping? Y  Comment transportation, Gaffer and eating ? N  Using the Toilet? N  In the past six months, have you accidently leaked urine? N  Do you have problems with loss of bowel control? N  Managing your Medications? N  Managing your Finances? N  Housekeeping or managing your Housekeeping? N  Some recent data might be hidden    Patient Care Team: [name], DO as PCP - General (Family Medicine) Mertie Moores, MD as Referring Physician (Pulmonary Disease)    Assessment:   This is a routine wellness examination for [name].  Exercise Activities and Dietary recommendations Current Exercise Habits: The patient does not participate in regular exercise at present, Exercise limited by: respiratory conditions(s)  Goals    . Increase water intake     Recommend drinking at least 5-6 glasses of water a day        Fall Risk Fall Risk  07/25/2018 01/03/2018 11/22/2017 07/22/2017 04/06/2017  Falls in the past year? No No No No No   FALL RISK PREVENTION PERTAINING TO THE HOME:  Any stairs in or around the home WITH handrails? YES Home free of loose throw rugs in walkways, pet beds, electrical cords, etc? No  Adequate lighting in your home to reduce risk of falls? Yes   ASSISTIVE DEVICES UTILIZED TO  PREVENT FALLS:  Life alert? Yes  Use of a cane, walker or w/c? Yes  Grab bars in the bathroom? Yes  Shower chair or bench in shower? Yes  Elevated toilet seat or a handicapped toilet? No   DME ORDERS:  DME order needed?  No   TIMED UP AND GO:  Was the test  performed? No . Patient in wheelchair    GAIT:  Patient remained in wheelchair during entire visit. Education: Fall risk prevention has been discussed.  Intervention(s) required? Yes   Depression Screen PHQ 2/9 Scores 07/25/2018 02/14/2018 01/03/2018 11/22/2017  PHQ - 2 Score 4 2 3 5   PHQ- 9 Score 9 12 16 21      Cognitive Function     6CIT Screen 07/25/2018 07/22/2017 08/10/2016  What Year? 0 points 0 points 0 points  What month? 0 points 0 points 0 points  What time? 0 points 0 points 0 points  Count back from 20 0 points 0 points 0 points  Months in reverse 0 points 0 points 0 points  Repeat phrase 0 points 0 points 0 points  Total Score 0 0 0    Immunization History  Administered Date(s) Administered  . Influenza, High Dose Seasonal PF 07/22/2017, 07/25/2018  . Influenza,inj,Quad PF,6+ Mos 06/11/2015, 08/10/2016  . Pneumococcal Conjugate-13 11/22/2017  . Pneumococcal Polysaccharide-23 04/24/2009  . Tdap 10/09/2011   Qualifies for Shingles Vaccine:Yes  Due for Shingrix. Education has been provided regarding the importance of this vaccine. Pt has been advised to call insurance company to determine out of pocket expense. Advised may also receive vaccine at local pharmacy or Health Dept. Verbalized acceptance and understanding. Patient declined.  Tdap: completed 10/09/11  Flu Vaccine: Due for Flu vaccine. Does the patient want to receive this vaccine today?  Yes .   Pneumococcal Vaccine: completed 11/22/17.  Screening Tests Health Maintenance  Topic Date Due  . OPHTHALMOLOGY EXAM  02/04/2017  . INFLUENZA VACCINE  04/28/2018  . FOOT EXAM  07/22/2018  . MAMMOGRAM  07/30/2019 (Originally 04/25/2002)  . COLONOSCOPY  07/30/2019 (Originally 04/25/2002)  . HEMOGLOBIN A1C  11/06/2018  . PNA vac Low Risk Adult (2 of 2 - PPSV23) 11/22/2018  . TETANUS/TDAP  10/08/2021  . DEXA SCAN  Completed  . Hepatitis C Screening  Addressed    Cancer Screenings:  Colorectal Screening:   Patient declines  Mammogram: patient declines  Bone Density: completed 08/04/2010  Lung Cancer Screening: (Low Dose CT Chest recommended if Age 33-80 years, 30 pack-year currently smoking OR have quit w/in 15years.) does qualify. Patient declines.   Additional Screening:  Hepatitis C Screening: does qualify; Completed 07/20/2008  Vision Screening: Recommended annual ophthalmology exams for early detection of glaucoma and other disorders of the eye. Is the patient up to date with their annual eye exam?  No  Who is the provider or what is the name of the office in which the pt attends annual eye exams? [facility]   Dental Screening: Recommended annual dental exams for proper oral hygiene  Community Resource Referral:  CRR required this visit?  Yes transportation needs    Plan:    I have personally reviewed and addressed the Medicare Annual Wellness questionnaire and have noted the following in the patient's chart:  A. Medical and social history B. Use of alcohol, tobacco or illicit drugs  C. Current medications and supplements D. Functional ability and status E.  Nutritional status F.  Physical activity G. Advance directives H. List of other physicians I.  Hospitalizations, surgeries, and ER visits in previous 12 months J.  Vitals K. Screenings such as hearing and vision if needed, cognitive and depression L. Referrals and appointments   In addition, I have reviewed and discussed with patient certain preventive protocols, quality metrics, and best practice recommendations. A written personalized care plan for preventive services as well as general preventive health recommendations were provided to patient.   Signed,  Reather Littler, LPN Nurse Health Advisor   Nurse Notes: Pt would like to discuss DNR with Dr. Laural Benes at next appt scheduled on 08/05/18.

## 2018-07-25 NOTE — Patient Instructions (Addendum)
[name] , Thank you for taking time to come for your Medicare Wellness Visit. I appreciate your ongoing commitment to your health goals. Please review the following plan we discussed and let me know if I can assist you in the future.   Screening recommendations/referrals: Colonoscopy: declined Mammogram: declined Bone Density: completed 2011 Recommended yearly ophthalmology/optometry visit for glaucoma screening and checkup Recommended yearly dental visit for hygiene and checkup  Vaccinations: Influenza vaccine: done today Pneumococcal vaccine: pneumovax due 11/22/18 Tdap vaccine: Up to date Shingles vaccine: declined    Advanced directives: Advance directive discussed with you today. I have provided a copy for you to complete at home and have notarized. Once this is complete please bring a copy in to our office so we can scan it into your chart.  Conditions/risks identified: recommended increase water intake  Next appointment: 08/05/18 1:00 pm Dr. Olevia Perches   Preventive Care 65 Years and Older, Female Preventive care refers to lifestyle choices and visits with your health care provider that can promote health and wellness. What does preventive care include?  A yearly physical exam. This is also called an annual well check.  Dental exams once or twice a year.  Routine eye exams. Ask your health care provider how often you should have your eyes checked.  Personal lifestyle choices, including:  Daily care of your teeth and gums.  Regular physical activity.  Eating a healthy diet.  Avoiding tobacco and drug use.  Limiting alcohol use.  Practicing safe sex.  Taking low-dose aspirin every day.  Taking vitamin and mineral supplements as recommended by your health care provider. What happens during an annual well check? The services and screenings done by your health care provider during your annual well check will depend on your age, overall health, lifestyle risk  factors, and family history of disease. Counseling  Your health care provider may ask you questions about your:  Alcohol use.  Tobacco use.  Drug use.  Emotional well-being.  Home and relationship well-being.  Sexual activity.  Eating habits.  History of falls.  Memory and ability to understand (cognition).  Work and work Astronomer.  Reproductive health. Screening  You may have the following tests or measurements:  Height, weight, and BMI.  Blood pressure.  Lipid and cholesterol levels. These may be checked every 5 years, or more frequently if you are over 34 years old.  Skin check.  Lung cancer screening. You may have this screening every year starting at age 37 if you have a 30-pack-year history of smoking and currently smoke or have quit within the past 15 years.  Fecal occult blood test (FOBT) of the stool. You may have this test every year starting at age 50.  Flexible sigmoidoscopy or colonoscopy. You may have a sigmoidoscopy every 5 years or a colonoscopy every 10 years starting at age 60.  Hepatitis C blood test.  Hepatitis B blood test.  Sexually transmitted disease (STD) testing.  Diabetes screening. This is done by checking your blood sugar (glucose) after you have not eaten for a while (fasting). You may have this done every 1-3 years.  Bone density scan. This is done to screen for osteoporosis. You may have this done starting at age 31.  Mammogram. This may be done every 1-2 years. Talk to your health care provider about how often you should have regular mammograms. Talk with your health care provider about your test results, treatment options, and if necessary, the need for more tests. Vaccines  Your health care provider may recommend certain vaccines, such as:  Influenza vaccine. This is recommended every year.  Tetanus, diphtheria, and acellular pertussis (Tdap, Td) vaccine. You may need a Td booster every 10 years.  Zoster vaccine. You  may need this after age 26.  Pneumococcal 13-valent conjugate (PCV13) vaccine. One dose is recommended after age 24.  Pneumococcal polysaccharide (PPSV23) vaccine. One dose is recommended after age 3. Talk to your health care provider about which screenings and vaccines you need and how often you need them. This information is not intended to replace advice given to you by your health care provider. Make sure you discuss any questions you have with your health care provider. Document Released: 10/11/2015 Document Revised: 06/03/2016 Document Reviewed: 07/16/2015 Elsevier Interactive Patient Education  2017 [place] Prevention in the Home Falls can cause injuries. They can happen to people of all ages. There are many things you can do to make your home safe and to help prevent falls. What can I do on the outside of my home?  Regularly fix the edges of walkways and driveways and fix any cracks.  Remove anything that might make you trip as you walk through a door, such as a raised step or threshold.  Trim any bushes or trees on the path to your home.  Use bright outdoor lighting.  Clear any walking paths of anything that might make someone trip, such as rocks or tools.  Regularly check to see if handrails are loose or broken. Make sure that both sides of any steps have handrails.  Any raised decks and porches should have guardrails on the edges.  Have any leaves, snow, or ice cleared regularly.  Use sand or salt on walking paths during winter.  Clean up any spills in your garage right away. This includes oil or grease spills. What can I do in the bathroom?  Use night lights.  Install grab bars by the toilet and in the tub and shower. Do not use towel bars as grab bars.  Use non-skid mats or decals in the tub or shower.  If you need to sit down in the shower, use a plastic, non-slip stool.  Keep the floor dry. Clean up any water that spills on the floor as soon as  it happens.  Remove soap buildup in the tub or shower regularly.  Attach bath mats securely with double-sided non-slip rug tape.  Do not have throw rugs and other things on the floor that can make you trip. What can I do in the bedroom?  Use night lights.  Make sure that you have a light by your bed that is easy to reach.  Do not use any sheets or blankets that are too big for your bed. They should not hang down onto the floor.  Have a firm chair that has side arms. You can use this for support while you get dressed.  Do not have throw rugs and other things on the floor that can make you trip. What can I do in the kitchen?  Clean up any spills right away.  Avoid walking on wet floors.  Keep items that you use a lot in easy-to-reach places.  If you need to reach something above you, use a strong step stool that has a grab bar.  Keep electrical cords out of the way.  Do not use floor polish or wax that makes floors slippery. If you must use wax, use non-skid floor wax.  Do  not have throw rugs and other things on the floor that can make you trip. What can I do with my stairs?  Do not leave any items on the stairs.  Make sure that there are handrails on both sides of the stairs and use them. Fix handrails that are broken or loose. Make sure that handrails are as long as the stairways.  Check any carpeting to make sure that it is firmly attached to the stairs. Fix any carpet that is loose or worn.  Avoid having throw rugs at the top or bottom of the stairs. If you do have throw rugs, attach them to the floor with carpet tape.  Make sure that you have a light switch at the top of the stairs and the bottom of the stairs. If you do not have them, ask someone to add them for you. What else can I do to help prevent falls?  Wear shoes that:  Do not have high heels.  Have rubber bottoms.  Are comfortable and fit you well.  Are closed at the toe. Do not wear sandals.  If  you use a stepladder:  Make sure that it is fully opened. Do not climb a closed stepladder.  Make sure that both sides of the stepladder are locked into place.  Ask someone to hold it for you, if possible.  Clearly mark and make sure that you can see:  Any grab bars or handrails.  First and last steps.  Where the edge of each step is.  Use tools that help you move around (mobility aids) if they are needed. These include:  Canes.  Walkers.  Scooters.  Crutches.  Turn on the lights when you go into a dark area. Replace any light bulbs as soon as they burn out.  Set up your furniture so you have a clear path. Avoid moving your furniture around.  If any of your floors are uneven, fix them.  If there are any pets around you, be aware of where they are.  Review your medicines with your doctor. Some medicines can make you feel dizzy. This can increase your chance of falling. Ask your doctor what other things that you can do to help prevent falls. This information is not intended to replace advice given to you by your health care provider. Make sure you discuss any questions you have with your health care provider. Document Released: 07/11/2009 Document Revised: 02/20/2016 Document Reviewed: 10/19/2014 Elsevier Interactive Patient Education  2017 [employer]  Influenza (Flu) Vaccine (Inactivated or Recombinant): What You Need to Know 1. Why get vaccinated? Influenza ("flu") is a contagious disease that spreads around the [place] every year, usually between October and May. Flu is caused by influenza viruses, and is spread mainly by coughing, sneezing, and close contact. Anyone can get flu. Flu strikes suddenly and can last several days. Symptoms vary by age, but can include:  fever/chills  sore throat  muscle aches  fatigue  cough  headache  runny or stuffy nose  Flu can also lead to pneumonia and blood infections, and cause diarrhea and seizures in  children. If you have a medical condition, such as heart or lung disease, flu can make it worse. Flu is more dangerous for some people. Infants and young children, people 84 years of age and older, pregnant women, and people with certain health conditions or a weakened immune system are at greatest risk. Each year thousands of people in the [place] die from flu, and  many more are hospitalized. Flu vaccine can:  keep you from getting flu,  make flu less severe if you do get it, and  keep you from spreading flu to your family and other people. 2. Inactivated and recombinant flu vaccines A dose of flu vaccine is recommended every flu season. Children 6 months through 54 years of age may need two doses during the same flu season. Everyone else needs only one dose each flu season. Some inactivated flu vaccines contain a very small amount of a mercury-based preservative called thimerosal. Studies have not shown thimerosal in vaccines to be harmful, but flu vaccines that do not contain thimerosal are available. There is no live flu virus in flu shots. They cannot cause the flu. There are many flu viruses, and they are always changing. Each year a new flu vaccine is made to protect against three or four viruses that are likely to cause disease in the upcoming flu season. But even when the vaccine doesn't exactly match these viruses, it may still provide some protection. Flu vaccine cannot prevent:  flu that is caused by a virus not covered by the vaccine, or  illnesses that look like flu but are not.  It takes about 2 weeks for protection to develop after vaccination, and protection lasts through the flu season. 3. Some people should not get this vaccine Tell the person who is giving you the vaccine:  If you have any severe, life-threatening allergies. If you ever had a life-threatening allergic reaction after a dose of flu vaccine, or have a severe allergy to any part of this vaccine, you may  be advised not to get vaccinated. Most, but not all, types of flu vaccine contain a small amount of egg protein.  If you ever had Guillain-Barr Syndrome (also called GBS). Some people with a history of GBS should not get this vaccine. This should be discussed with your doctor.  If you are not feeling well. It is usually okay to get flu vaccine when you have a mild illness, but you might be asked to come back when you feel better.  4. Risks of a vaccine reaction With any medicine, including vaccines, there is a chance of reactions. These are usually mild and go away on their own, but serious reactions are also possible. Most people who get a flu shot do not have any problems with it. Minor problems following a flu shot include:  soreness, redness, or swelling where the shot was given  hoarseness  sore, red or itchy eyes  cough  fever  aches  headache  itching  fatigue  If these problems occur, they usually begin soon after the shot and last 1 or 2 days. More serious problems following a flu shot can include the following:  There may be a small increased risk of Guillain-Barre Syndrome (GBS) after inactivated flu vaccine. This risk has been estimated at 1 or 2 additional cases per million people vaccinated. This is much lower than the risk of severe complications from flu, which can be prevented by flu vaccine.  Young children who get the flu shot along with pneumococcal vaccine (PCV13) and/or DTaP vaccine at the same time might be slightly more likely to have a seizure caused by fever. Ask your doctor for more information. Tell your doctor if a child who is getting flu vaccine has ever had a seizure.  Problems that could happen after any injected vaccine:  People sometimes faint after a medical procedure, including vaccination. Sitting or  lying down for about 15 minutes can help prevent fainting, and injuries caused by a fall. Tell your doctor if you feel dizzy, or have vision  changes or ringing in the ears.  Some people get severe pain in the shoulder and have difficulty moving the arm where a shot was given. This happens very rarely.  Any medication can cause a severe allergic reaction. Such reactions from a vaccine are very rare, estimated at about 1 in a million doses, and would happen within a few minutes to a few hours after the vaccination. As with any medicine, there is a very remote chance of a vaccine causing a serious injury or death. The safety of vaccines is always being monitored. For more information, visit: http://floyd.org/ 5. What if there is a serious reaction? What should I look for? Look for anything that concerns you, such as signs of a severe allergic reaction, very high fever, or unusual behavior. Signs of a severe allergic reaction can include hives, swelling of the face and throat, difficulty breathing, a fast heartbeat, dizziness, and weakness. These would start a few minutes to a few hours after the vaccination. What should I do?  If you think it is a severe allergic reaction or other emergency that can't wait, call 9-1-1 and get the person to the nearest hospital. Otherwise, call your doctor.  Reactions should be reported to the Vaccine Adverse Event Reporting System (VAERS). Your doctor should file this report, or you can do it yourself through the VAERS web site at www.vaers.LAgents.no, or by calling 1-(214)073-4500. ? VAERS does not give medical advice. 6. The National Vaccine Injury Compensation Program The [employer] Vaccine Injury Compensation Program (VICP) is a federal program that was created to compensate people who may have been injured by certain vaccines. Persons who believe they may have been injured by a vaccine can learn about the program and about filing a claim by calling 1-(510)624-4203 or visiting the VICP website at SpiritualWord.at. There is a time limit to file a claim for compensation. 7. How can I  learn more?  Ask your healthcare provider. He or she can give you the vaccine package insert or suggest other sources of information.  Call your local or state health department.  Contact the Centers for Disease Control and Prevention (CDC): ? Call (203)007-1619 (1-800-CDC-INFO) or ? Visit CDC's website at BiotechRoom.com.cy Vaccine Information Statement, Inactivated Influenza Vaccine (05/04/2014) This information is not intended to replace advice given to you by your health care provider. Make sure you discuss any questions you have with your health care provider. Document Released: 07/09/2006 Document Revised: 06/04/2016 Document Reviewed: 06/04/2016 Elsevier Interactive Patient Education  2017 [employer].

## 2018-08-05 ENCOUNTER — Encounter: Payer: Self-pay | Admitting: Family Medicine

## 2018-08-05 ENCOUNTER — Ambulatory Visit (INDEPENDENT_AMBULATORY_CARE_PROVIDER_SITE_OTHER): Payer: Medicare Other | Admitting: Family Medicine

## 2018-08-05 ENCOUNTER — Telehealth: Payer: Self-pay | Admitting: Family Medicine

## 2018-08-05 VITALS — BP 120/68 | HR 85 | Temp 98.2°F

## 2018-08-05 DIAGNOSIS — F419 Anxiety disorder, unspecified: Secondary | ICD-10-CM | POA: Diagnosis not present

## 2018-08-05 DIAGNOSIS — Z598 Other problems related to housing and economic circumstances: Secondary | ICD-10-CM

## 2018-08-05 DIAGNOSIS — I129 Hypertensive chronic kidney disease with stage 1 through stage 4 chronic kidney disease, or unspecified chronic kidney disease: Secondary | ICD-10-CM | POA: Diagnosis not present

## 2018-08-05 DIAGNOSIS — Z5989 Other problems related to housing and economic circumstances: Secondary | ICD-10-CM

## 2018-08-05 DIAGNOSIS — E782 Mixed hyperlipidemia: Secondary | ICD-10-CM

## 2018-08-05 DIAGNOSIS — J449 Chronic obstructive pulmonary disease, unspecified: Secondary | ICD-10-CM

## 2018-08-05 DIAGNOSIS — Z66 Do not resuscitate: Secondary | ICD-10-CM | POA: Insufficient documentation

## 2018-08-05 DIAGNOSIS — G2581 Restless legs syndrome: Secondary | ICD-10-CM

## 2018-08-05 DIAGNOSIS — E119 Type 2 diabetes mellitus without complications: Secondary | ICD-10-CM

## 2018-08-05 LAB — BAYER DCA HB A1C WAIVED: HB A1C (BAYER DCA - WAIVED): 6.4 % (ref <7.0)

## 2018-08-05 MED ORDER — TIOTROPIUM BROMIDE MONOHYDRATE 18 MCG IN CAPS: ORAL_CAPSULE | RESPIRATORY_TRACT | 1 refills | Status: AC

## 2018-08-05 MED ORDER — ALBUTEROL SULFATE HFA 108 (90 BASE) MCG/ACT IN AERS: 1.0000 | INHALATION_SPRAY | RESPIRATORY_TRACT | 6 refills | Status: AC | PRN

## 2018-08-05 MED ORDER — ALBUTEROL SULFATE (2.5 MG/3ML) 0.083% IN NEBU: INHALATION_SOLUTION | RESPIRATORY_TRACT | 1 refills | Status: DC

## 2018-08-05 MED ORDER — SERTRALINE HCL 100 MG PO TABS: 100.0000 mg | ORAL_TABLET | Freq: Every day | ORAL | 1 refills | Status: AC

## 2018-08-05 MED ORDER — ATENOLOL 50 MG PO TABS: 50.0000 mg | ORAL_TABLET | Freq: Every day | ORAL | 1 refills | Status: AC

## 2018-08-05 MED ORDER — GLUCOSE BLOOD VI STRP: ORAL_STRIP | 12 refills | Status: AC

## 2018-08-05 MED ORDER — CLONAZEPAM 1 MG PO TABS: 1.0000 mg | ORAL_TABLET | Freq: Every day | ORAL | 0 refills | Status: AC | PRN

## 2018-08-05 MED ORDER — PRAVASTATIN SODIUM 40 MG PO TABS: 40.0000 mg | ORAL_TABLET | Freq: Every day | ORAL | 1 refills | Status: AC

## 2018-08-05 MED ORDER — MECLIZINE HCL 25 MG PO TABS: 25.0000 mg | ORAL_TABLET | Freq: Three times a day (TID) | ORAL | 3 refills | Status: AC | PRN

## 2018-08-05 MED ORDER — LOSARTAN POTASSIUM 25 MG PO TABS: 25.0000 mg | ORAL_TABLET | Freq: Every day | ORAL | 1 refills | Status: AC

## 2018-08-05 MED ORDER — THEOPHYLLINE ER 400 MG PO TB24: 400.0000 mg | ORAL_TABLET | Freq: Every day | ORAL | 1 refills | Status: AC

## 2018-08-05 MED ORDER — ROPINIROLE HCL 0.5 MG PO TABS: 0.5000 mg | ORAL_TABLET | Freq: Three times a day (TID) | ORAL | 1 refills | Status: AC

## 2018-08-05 NOTE — Progress Notes (Signed)
BP 120/68 (BP Location: Right Arm, Patient Position: Sitting, Cuff Size: Normal)   Pulse 85   Temp 98.2 F (36.8 C) (Oral)   SpO2 (!) 88% Comment: without o2   Subjective:    Patient ID: [name], female    DOB: [birth year], 67 y.o.   MRN: [id number]  HPI: [name] is a 68 y.o. female  Chief Complaint  Patient presents with  . Diabetes  . Anxiety   DIABETES Hypoglycemic episodes:no Polydipsia/polyuria: no Visual disturbance: no Chest pain: no Paresthesias: no Glucose Monitoring: yes  Accucheck frequency: Not Checking Taking Insulin?: no Blood Pressure Monitoring: not checking Retinal Examination: Not up to Date Foot Exam: Up to Date Diabetic Education: Completed Pneumovax: Up to Date Influenza: Up to Date Aspirin: yes  ANXIETY/STRESS- having a lot of stress at home living with her room mate. She notes that she continues with a lot of stress. She would like to move out on her own, but can't afford it. Duration:exacerbated Anxious mood: yes  Excessive worrying: yes Irritability: yes  Sweating: no Nausea: no Palpitations:no Hyperventilation: no Panic attacks: no Agoraphobia: no  Obscessions/compulsions: no Depressed mood: yes Depression screen [facility] 2/9 08/05/2018 07/25/2018 02/14/2018 01/03/2018 11/22/2017  Decreased Interest 1 2 1 1 2   Down, Depressed, Hopeless 1 2 1 2 3   PHQ - 2 Score 2 4 2 3 5   Altered sleeping 2 2 2 2 3   Tired, decreased energy 3 2 3 2 3   Change in appetite 1 1 2 2 3   Feeling bad or failure about yourself  1 0 1 2 3   Trouble concentrating 1 0 2 2 3   Moving slowly or fidgety/restless 0 0 0 2 0  Suicidal thoughts 1 0 0 1 1  PHQ-9 Score 11 9 12 16 21   Difficult doing work/chores Very difficult Somewhat difficult - Very difficult Somewhat difficult  Some recent data might be hidden   GAD 7 : Generalized Anxiety Score 08/05/2018 02/14/2018 01/03/2018 11/22/2017  Nervous, Anxious, on Edge 1 1 1 2   Control/stop worrying 2 0 2 1  Worry  too much - different things 2 1 2 1   Trouble relaxing 1 1 1 2   Restless 2 2 1 3   Easily annoyed or irritable 2 0 1 0  Afraid - awful might happen 1 0 1 0  Total GAD 7 Score 11 5 9 9   Anxiety Difficulty Very difficult Somewhat difficult Very difficult Somewhat difficult   Anhedonia: no Weight changes: no Insomnia: no   Hypersomnia: yes Fatigue/loss of energy: yes Feelings of worthlessness: yes Feelings of guilt: yes Impaired concentration/indecisiveness: yes Suicidal ideations: no  Crying spells: yes Recent Stressors/Life Changes: yes   Relationship problems: yes   Family stress: yes     Financial stress: yes    Job stress: no    Recent death/loss: no   Relevant past medical, surgical, family and social history reviewed and updated as indicated. Interim medical history since our last visit reviewed. Allergies and medications reviewed and updated.  Review of Systems  Constitutional: Negative.   Respiratory: Negative.   Cardiovascular: Negative.   Skin: Negative.   Neurological: Negative.   Psychiatric/Behavioral: Positive for dysphoric mood. Negative for agitation, behavioral problems, confusion, decreased concentration, hallucinations, self-injury, sleep disturbance and suicidal ideas. The patient is nervous/anxious. The patient is not hyperactive.     Per HPI unless specifically indicated above     Objective:    BP 120/68 (BP Location: Right Arm, Patient Position:  Sitting, Cuff Size: Normal)   Pulse 85   Temp 98.2 F (36.8 C) (Oral)   SpO2 (!) 88% Comment: without o2  Wt Readings from Last 3 Encounters:  07/25/18 214 lb (97.1 kg)  05/17/18 213 lb (96.6 kg)  04/07/18 213 lb (96.6 kg)    Physical Exam  Constitutional: She is oriented to person, place, and time. She appears well-developed and well-nourished. No distress.  HENT:  Head: Normocephalic and atraumatic.  Right Ear: Hearing normal.  Left Ear: Hearing normal.  Nose: Nose normal.  Eyes: Conjunctivae  and lids are normal. Right eye exhibits no discharge. Left eye exhibits no discharge. No scleral icterus.  Cardiovascular: Normal rate, regular rhythm and normal heart sounds.  Pulmonary/Chest: Effort normal. No respiratory distress.  Musculoskeletal: Normal range of motion.  Neurological: She is alert and oriented to person, place, and time.  Skin: Skin is intact. No rash noted. She is not diaphoretic.  Psychiatric: She has a normal mood and affect. Her speech is normal and behavior is normal. Judgment and thought content normal. Cognition and memory are normal.    Results for orders placed or performed in visit on 05/06/18  Bayer DCA Hb A1c Waived  Result Value Ref Range   HB A1C (BAYER DCA - WAIVED) 6.3 <7.0 %  CBC with Differential/Platelet  Result Value Ref Range   WBC 10.5 3.4 - 10.8 x10E3/uL   RBC 3.78 3.77 - 5.28 x10E6/uL   Hemoglobin 11.1 11.1 - 15.9 g/dL   Hematocrit 16.1 09.6 - 46.6 %   MCV 93 79 - 97 fL   MCH 29.4 26.6 - 33.0 pg   MCHC 31.7 31.5 - 35.7 g/dL   RDW 04.5 40.9 - 81.1 %   Platelets 237 150 - 450 x10E3/uL   Neutrophils 81 Not Estab. %   Lymphs 11 Not Estab. %   Monocytes 6 Not Estab. %   Eos 2 Not Estab. %   Basos 0 Not Estab. %   Neutrophils Absolute 8.5 (H) 1.4 - 7.0 x10E3/uL   Lymphocytes Absolute 1.1 0.7 - 3.1 x10E3/uL   Monocytes Absolute 0.6 0.1 - 0.9 x10E3/uL   EOS (ABSOLUTE) 0.2 0.0 - 0.4 x10E3/uL   Basophils Absolute 0.0 0.0 - 0.2 x10E3/uL   Immature Granulocytes 0 Not Estab. %   Immature Grans (Abs) 0.0 0.0 - 0.1 x10E3/uL  Comprehensive metabolic panel  Result Value Ref Range   Glucose 112 (H) 65 - 99 mg/dL   BUN 15 8 - 27 mg/dL   Creatinine, Ser 9.14 (H) 0.57 - 1.00 mg/dL   GFR calc non Af Amer 57 (L) >59 mL/min/1.73   GFR calc Af Amer 65 >59 mL/min/1.73   BUN/Creatinine Ratio 15 12 - 28   Sodium 148 (H) 134 - 144 mmol/L   Potassium 3.9 3.5 - 5.2 mmol/L   Chloride 95 (L) 96 - 106 mmol/L   CO2 34 (H) 20 - 29 mmol/L   Calcium 9.8 8.7 -  10.3 mg/dL   Total Protein 6.2 6.0 - 8.5 g/dL   Albumin 4.2 3.6 - 4.8 g/dL   Globulin, Total 2.0 1.5 - 4.5 g/dL   Albumin/Globulin Ratio 2.1 1.2 - 2.2   Bilirubin Total <0.2 0.0 - 1.2 mg/dL   Alkaline Phosphatase 87 39 - 117 IU/L   AST 15 0 - 40 IU/L   ALT 14 0 - 32 IU/L  Lipid Panel w/o Chol/HDL Ratio  Result Value Ref Range   Cholesterol, Total 161 100 - 199 mg/dL  Triglycerides 89 0 - 149 mg/dL   HDL 61 >09 mg/dL   VLDL Cholesterol Cal 18 5 - 40 mg/dL   LDL Calculated 82 0 - 99 mg/dL  Ferritin  Result Value Ref Range   Ferritin 24 15 - 150 ng/mL      Assessment & Plan:   Problem List Items Addressed This Visit      Respiratory   COPD (chronic obstructive pulmonary disease) (HCC)    [facility]. Rxs sent today. Stable today.      Relevant Medications   tiotropium (SPIRIVA HANDIHALER) 18 MCG inhalation capsule   theophylline (UNIPHYL) 400 MG 24 hr tablet   albuterol (PROVENTIL) (2.5 MG/3ML) 0.083% nebulizer solution   albuterol (PROVENTIL HFA;VENTOLIN HFA) 108 (90 Base) MCG/ACT inhaler     Endocrine   Type 2 diabetes, diet controlled (HCC) - Primary    Going in the wrong direction with A1c of 6.4- will continue current regimen. Continue to monitor. Call with any concerns.       Relevant Medications   pravastatin (PRAVACHOL) 40 MG tablet   losartan (COZAAR) 25 MG tablet   Other Relevant Orders   Bayer DCA Hb A1c Waived     Genitourinary   Benign hypertensive renal disease    Needs [facility]ills [facility] due to [facility]. Rxs sent today. Stable today.      Relevant Medications   losartan (COZAAR) 25 MG tablet   atenolol (TENORMIN) 50 MG tablet     Other   Anxiety    Exacerbated due to living situation. Referral to [facility] made today. Call with any concerns. Continue current regimen. Rx sent to her pharmacy. Call with any concerns.       Relevant Medications   sertraline (ZOLOFT) 100 MG tablet   rOPINIRole (REQUIP) 0.5 MG tablet    Hyperlipidemia    Needs [facility]ills [facility] due to [facility]. Rxs sent today. Stable today.      Relevant Medications   pravastatin (PRAVACHOL) 40 MG tablet   losartan (COZAAR) 25 MG tablet   atenolol (TENORMIN) 50 MG tablet   Restless legs syndrome (RLS)    Needs refills [facility] due to [facility]. Rxs sent today. Stable today.      DNR (do not resuscitate)    Discussed with patient. She would like to be DNR- form signed today and given to patient.        Other Visit Diagnoses    Living accommodation issues       Referral to [facility] made today. Significant issues with her living situation. Call with any concerns.    Relevant Orders   Ambulatory referral to [facility]       Follow up plan: Return After 11/22/17, for Physical.

## 2018-08-05 NOTE — Assessment & Plan Note (Signed)
Going in the wrong direction with A1c of 6.4- will continue current regimen. Continue to monitor. Call with any concerns.

## 2018-08-05 NOTE — Assessment & Plan Note (Signed)
Needs refills [facility] due to [facility]. Rxs sent today. Stable today. 

## 2018-08-05 NOTE — Assessment & Plan Note (Signed)
Needs refills [facility] due to [facility]. Rxs sent today. Stable today.

## 2018-08-05 NOTE — Assessment & Plan Note (Signed)
Discussed with patient. She would like to be DNR- form signed today and given to patient.

## 2018-08-05 NOTE — Assessment & Plan Note (Signed)
Exacerbated due to living situation. Referral to [facility] made today. Call with any concerns. Continue current regimen. Rx sent to her pharmacy. Call with any concerns.

## 2018-08-08 ENCOUNTER — Telehealth: Payer: Self-pay | Admitting: Family Medicine

## 2018-08-08 NOTE — Telephone Encounter (Signed)
Called Pt and LVM regarding Publishing rights manager Referral for [employer] and housing. Pt stated that she was unavailable to talk she will call me back directly, will follow up on 11/15 if haven't heard back.   c/b # [phone]  Manuela Schwartz Care Guide

## 2018-08-10 ENCOUNTER — Other Ambulatory Visit: Payer: Self-pay | Admitting: *Deleted

## 2018-08-10 MED ORDER — ALBUTEROL SULFATE (2.5 MG/3ML) 0.083% IN NEBU: INHALATION_SOLUTION | RESPIRATORY_TRACT | 1 refills | Status: DC

## 2018-08-10 NOTE — Telephone Encounter (Signed)
Medication resent to the correct pharmacy as requested by the pt.

## 2018-08-10 NOTE — Telephone Encounter (Signed)
albuterol (PROVENTIL) (2.5 MG/3ML) 0.083% nebulizer solution  Sent to wrong pharmacy and [facility] [address] [place]. Patient needs this asap because she is going out of town this Friday.

## 2018-08-13 DIAGNOSIS — J449 Chronic obstructive pulmonary disease, unspecified: Secondary | ICD-10-CM | POA: Diagnosis not present

## 2018-08-15 ENCOUNTER — Telehealth: Payer: Self-pay | Admitting: Family Medicine

## 2018-08-15 NOTE — Telephone Encounter (Unsigned)
Copied from CRM [id number]. Topic: Quick Communication - See Telephone Encounter >> Aug 15, 2018  1:07 PM Floria RavelingStovall, Shana A wrote: CRM for notification. See Telephone encounter for: 08/15/18.  Pt called in and stated that her gabapentin (NEURONTIN) 100 MG capsule [[id number]][[id number]]  and  budesonide-formoterol (SYMBICORT) 160-4.5 MCG/ACT inhaler [[id number]][[id number]] was denied by her medicare Part D.  [facility] will not fill these 2 meds?  Will these meds need a PA?   Please advise  Best number  [phone] Pharmacy - [facility]

## 2018-08-17 ENCOUNTER — Other Ambulatory Visit: Payer: Self-pay | Admitting: Family Medicine

## 2018-08-17 DIAGNOSIS — F419 Anxiety disorder, unspecified: Secondary | ICD-10-CM

## 2018-08-17 NOTE — Telephone Encounter (Signed)
Copied from CRM [id number]. Topic: Quick Communication - Rx Refill/Question >> Aug 17, 2018  2:38 PM Percival SpanishKennedy, Cheryl W wrote: Pt no longer use [facility] mail order     Medication budesonide-formoterol (SYMBICORT) 160-4.5 MCG/ACT inhaler   L  gabapentin (NEURONTIN) 100 MG capsule     hydrochlorothiazide (HYDRODIURIL) 25 MG tablet    Preferred Pharmacy   [facility]   Agent: Please be advised that RX refills may take up to 3 business days. We ask that you follow-up with your pharmacy.

## 2018-08-18 MED ORDER — HYDROCHLOROTHIAZIDE 25 MG PO TABS: 25.0000 mg | ORAL_TABLET | Freq: Every day | ORAL | 1 refills | Status: AC

## 2018-08-18 MED ORDER — BUDESONIDE-FORMOTEROL FUMARATE 160-4.5 MCG/ACT IN AERO: 2.0000 | INHALATION_SPRAY | Freq: Two times a day (BID) | RESPIRATORY_TRACT | 4 refills | Status: AC

## 2018-08-18 MED ORDER — GABAPENTIN 100 MG PO CAPS: 200.0000 mg | ORAL_CAPSULE | Freq: Four times a day (QID) | ORAL | 3 refills | Status: AC

## 2018-08-19 DIAGNOSIS — J449 Chronic obstructive pulmonary disease, unspecified: Secondary | ICD-10-CM | POA: Diagnosis not present

## 2018-08-29 ENCOUNTER — Ambulatory Visit: Payer: Self-pay

## 2018-08-29 NOTE — Telephone Encounter (Signed)
Called pt.  Is asking about the order for Albuterol Inhaler.  Reported receiving letter from [employer] stating the [employer]ol-Pro Air Inhaler is not covered in her plan.  Requested to have this changed to the generic form of Albuterol Inhaler.  Informed of order on 08/05/18 for Albuterol (Proventil HFA) Inhaler.  [facility]  Spoke with Pharmacy Tech, [place]asmine.  Per Leavy CellaJasmine, the [employer]lbuterol-Pro Air was mailed out on 11/15, because "[employer] Insurance will only cover the brand."  Advise Jasmine that the pt. Has a letter that stated the opposite of this.    Phone call to pt.  Requested her to call [facility] and request to speak to a Pharmacist, and discuss what her letter said vs. what the pharmacy tech reported.  Pt. Agrees with plan.       Reason for Disposition . Health Information question, no triage required and triager able to answer question  Answer Assessment - Initial Assessment Questions 1. REASON FOR CALL or QUESTION: "What is your reason for calling today?" or "How can I best help you?" or "What question do you have that I can help answer?"     Wants to know specifically what Dr. Laural BenesJohnson ordered when she sent in the order for Albuterol Inhaler; rec'd letter from Insurance stating the brand Pro-Air is not covered by her plan.  Protocols used: INFORMATION ONLY CALL-A-AH  Message from Lorayne BenderAmber L Alexander sent at 08/29/2018 1:31 PM EST   Summary: same medication?   Pt calling. States she got her inhaler in the mail [facility] and it is ProAir - states she is supposed to have albuterol. Pt wants to make sure this is the same thing.

## 2018-09-12 DIAGNOSIS — J449 Chronic obstructive pulmonary disease, unspecified: Secondary | ICD-10-CM | POA: Diagnosis not present

## 2018-09-14 ENCOUNTER — Ambulatory Visit: Payer: Self-pay | Admitting: *Deleted

## 2018-09-14 NOTE — Telephone Encounter (Signed)
Pt thinks she has the beginning of bronchitis. She has a hx of COPD. She is on oxygen at 3.5 liters.  She has a productive cough. No fever. Just feels tired. Using her inhaler and nebs. Using a vaporizer.  She can not get to an appointment today or tomorrow. She is asking for medication to be called in for her. Her room mate Bonita QuinLinda died on Monday and her funeral is on Thursday.  She has to be at the church by 11 am.   Her O2 sat is 96 right now. B/P 130/80, HR 91-95 and temp is 98.5. Encourage pushing fluids, keep check on temp and oxygen level. Advised getting rest and taking cough meds over the counter and doing breathing treatments.  [facility] getting a message to Dr. Laural BenesJohnson. She is off work now but flow can send this message to her for review. Pt voiced understanding.   Reason for Disposition . [1] Known COPD or other severe lung disease (i.e., bronchiectasis, cystic fibrosis, lung surgery) AND [2] worsening symptoms (i.e., increased sputum purulence or amount, increased breathing difficulty  Answer Assessment - Initial Assessment Questions 1. ONSET: "When did the cough begin?"      yesterday 2. SEVERITY: "How bad is the cough today?"      Productive cough 3. RESPIRATORY DISTRESS: "Describe your breathing."      Shortness of breath 4. FEVER: "Do you have a fever?" If so, ask: "What is your temperature, how was it measured, and when did it start?"     No fever 5. SPUTUM: "Describe the color of your sputum" (clear, white, yellow, green)     Yellow green 6. HEMOPTYSIS: "Are you coughing up any blood?" If so ask: "How much?" (flecks, streaks, tablespoons, etc.)     no 7. CARDIAC HISTORY: "Do you have any history of heart disease?" (e.g., heart attack, congestive heart failure)      no 8. LUNG HISTORY: "Do you have any history of lung disease?"  (e.g., pulmonary embolus, asthma, emphysema)     COPD 9. PE RISK FACTORS: "Do you have a history  of blood clots?" (or: recent major surgery, recent prolonged travel, bedridden)     no 10. OTHER SYMPTOMS: "Do you have any other symptoms?" (e.g., runny nose, wheezing, chest pain)       no 11. PREGNANCY: "Is there any chance you are pregnant?" "When was your last menstrual period?"       no 12. TRAVEL: "Have you traveled out of the country in the last month?" (e.g., travel history, exposures)       no  Protocols used: COUGH - ACUTE PRODUCTIVE-A-AH

## 2018-09-15 MED ORDER — PREDNISONE 10 MG PO TABS: ORAL_TABLET | ORAL | 0 refills | Status: AC

## 2018-09-15 NOTE — Telephone Encounter (Signed)
Called patient and relayed information. She states she cannot come in. She has a funeral today, and [place] with [place] daughter. Encouraged patient to [facility] if she was unable to come in here to be evaluated. Patient stated she would call to schedule when she returns after the first of the year, and is agreeable to [facility] [place] when she gets there.

## 2018-09-15 NOTE — Telephone Encounter (Signed)
Prednisone sent to her pharmacy until we can see her. She needs to come in ASAP for appointment.

## 2018-09-15 NOTE — Addendum Note (Signed)
Addended by: Dorcas CarrowJOHNSON, Urvi Imes P on: 09/15/2018 10:33 AM   Modules accepted: Orders

## 2018-09-16 ENCOUNTER — Ambulatory Visit: Payer: Medicare Other | Admitting: Family Medicine

## 2018-09-18 DIAGNOSIS — J449 Chronic obstructive pulmonary disease, unspecified: Secondary | ICD-10-CM | POA: Diagnosis not present

## 2018-10-13 DIAGNOSIS — J449 Chronic obstructive pulmonary disease, unspecified: Secondary | ICD-10-CM | POA: Diagnosis not present

## 2018-10-15 ENCOUNTER — Other Ambulatory Visit: Payer: Self-pay | Admitting: Family Medicine

## 2018-10-17 NOTE — Telephone Encounter (Signed)
Requested Prescriptions  Pending Prescriptions Disp Refills  . albuterol (PROVENTIL) (2.5 MG/3ML) 0.083% nebulizer solution [Pharmacy Med Name: ALBUTEROL 0.083%(2.5MG /3ML) 25X3ML] 300 mL 1    Sig: USE 1 VIAL VIA NEBULIZER EVERY 6 HOURS AS NEEDED FOR WHEEZING OR SHORTNESS OF BREATH     Pulmonology:  Beta Agonists Failed - 10/15/2018 10:16 AM      Failed - One inhaler should last at least one month. If the patient is requesting refills earlier, contact the patient to check for uncontrolled symptoms.      Passed - Valid encounter within last 12 months    Recent Outpatient Visits          2 months ago Type 2 diabetes, diet controlled (HCC)   [facility], Megan P, DO   5 months ago Type 2 diabetes, diet controlled (HCC)   [facility], Megan P, DO   6 months ago Benign paroxysmal positional vertigo due to bilateral vestibular disorder   [facility], Megan P, DO   7 months ago Chronic obstructive pulmonary disease, unspecified COPD type (HCC)   [facility] Johnson, Megan P, DO   7 months ago Chronic obstructive pulmonary disease, unspecified COPD type (HCC)   [facility] Johnson, Megan P, DO      Future Appointments            In 1 month Johnson, Oralia Rud, DO [facility]   In 9 months  [facility]         phone call to pt.  Reported she is doing well.  Reported her COPD is stable and denied any concerning symptoms.  Has CPE 11/2018. Advised will refill her Albuterol Neb at this time.

## 2018-10-19 DIAGNOSIS — J449 Chronic obstructive pulmonary disease, unspecified: Secondary | ICD-10-CM | POA: Diagnosis not present

## 2018-11-13 DIAGNOSIS — J449 Chronic obstructive pulmonary disease, unspecified: Secondary | ICD-10-CM | POA: Diagnosis not present

## 2018-11-19 DIAGNOSIS — J449 Chronic obstructive pulmonary disease, unspecified: Secondary | ICD-10-CM | POA: Diagnosis not present

## 2018-11-27 ENCOUNTER — Encounter: Payer: Self-pay | Admitting: Emergency Medicine

## 2018-11-27 ENCOUNTER — Other Ambulatory Visit: Payer: Self-pay

## 2018-11-27 ENCOUNTER — Emergency Department: Payer: Medicare Other

## 2018-11-27 ENCOUNTER — Inpatient Hospital Stay
Admission: EM | Admit: 2018-11-27 | Discharge: 2018-12-28 | DRG: 207 | Disposition: E | Payer: Medicare Other | Attending: Internal Medicine | Admitting: Internal Medicine

## 2018-11-27 DIAGNOSIS — E669 Obesity, unspecified: Secondary | ICD-10-CM | POA: Diagnosis present

## 2018-11-27 DIAGNOSIS — R0603 Acute respiratory distress: Secondary | ICD-10-CM

## 2018-11-27 DIAGNOSIS — J9622 Acute and chronic respiratory failure with hypercapnia: Secondary | ICD-10-CM | POA: Diagnosis present

## 2018-11-27 DIAGNOSIS — E440 Moderate protein-calorie malnutrition: Secondary | ICD-10-CM | POA: Diagnosis present

## 2018-11-27 DIAGNOSIS — R451 Restlessness and agitation: Secondary | ICD-10-CM

## 2018-11-27 DIAGNOSIS — Z79899 Other long term (current) drug therapy: Secondary | ICD-10-CM

## 2018-11-27 DIAGNOSIS — Z515 Encounter for palliative care: Secondary | ICD-10-CM | POA: Diagnosis not present

## 2018-11-27 DIAGNOSIS — Z9911 Dependence on respirator [ventilator] status: Secondary | ICD-10-CM

## 2018-11-27 DIAGNOSIS — N179 Acute kidney failure, unspecified: Secondary | ICD-10-CM | POA: Diagnosis present

## 2018-11-27 DIAGNOSIS — E785 Hyperlipidemia, unspecified: Secondary | ICD-10-CM | POA: Diagnosis present

## 2018-11-27 DIAGNOSIS — F419 Anxiety disorder, unspecified: Secondary | ICD-10-CM | POA: Diagnosis present

## 2018-11-27 DIAGNOSIS — F17210 Nicotine dependence, cigarettes, uncomplicated: Secondary | ICD-10-CM | POA: Diagnosis present

## 2018-11-27 DIAGNOSIS — Z7951 Long term (current) use of inhaled steroids: Secondary | ICD-10-CM

## 2018-11-27 DIAGNOSIS — Z978 Presence of other specified devices: Secondary | ICD-10-CM | POA: Diagnosis not present

## 2018-11-27 DIAGNOSIS — K567 Ileus, unspecified: Secondary | ICD-10-CM | POA: Diagnosis not present

## 2018-11-27 DIAGNOSIS — Z825 Family history of asthma and other chronic lower respiratory diseases: Secondary | ICD-10-CM

## 2018-11-27 DIAGNOSIS — J15211 Pneumonia due to Methicillin susceptible Staphylococcus aureus: Secondary | ICD-10-CM | POA: Diagnosis present

## 2018-11-27 DIAGNOSIS — Z885 Allergy status to narcotic agent status: Secondary | ICD-10-CM

## 2018-11-27 DIAGNOSIS — R0902 Hypoxemia: Secondary | ICD-10-CM | POA: Diagnosis not present

## 2018-11-27 DIAGNOSIS — Z7189 Other specified counseling: Secondary | ICD-10-CM

## 2018-11-27 DIAGNOSIS — Z6839 Body mass index (BMI) 39.0-39.9, adult: Secondary | ICD-10-CM | POA: Diagnosis not present

## 2018-11-27 DIAGNOSIS — Z452 Encounter for adjustment and management of vascular access device: Secondary | ICD-10-CM | POA: Diagnosis not present

## 2018-11-27 DIAGNOSIS — Z888 Allergy status to other drugs, medicaments and biological substances status: Secondary | ICD-10-CM

## 2018-11-27 DIAGNOSIS — I129 Hypertensive chronic kidney disease with stage 1 through stage 4 chronic kidney disease, or unspecified chronic kidney disease: Secondary | ICD-10-CM | POA: Diagnosis present

## 2018-11-27 DIAGNOSIS — J9600 Acute respiratory failure, unspecified whether with hypoxia or hypercapnia: Secondary | ICD-10-CM | POA: Diagnosis not present

## 2018-11-27 DIAGNOSIS — Z4659 Encounter for fitting and adjustment of other gastrointestinal appliance and device: Secondary | ICD-10-CM

## 2018-11-27 DIAGNOSIS — J441 Chronic obstructive pulmonary disease with (acute) exacerbation: Secondary | ICD-10-CM

## 2018-11-27 DIAGNOSIS — J9690 Respiratory failure, unspecified, unspecified whether with hypoxia or hypercapnia: Secondary | ICD-10-CM

## 2018-11-27 DIAGNOSIS — S0990XA Unspecified injury of head, initial encounter: Secondary | ICD-10-CM | POA: Diagnosis not present

## 2018-11-27 DIAGNOSIS — Z9981 Dependence on supplemental oxygen: Secondary | ICD-10-CM

## 2018-11-27 DIAGNOSIS — Z9851 Tubal ligation status: Secondary | ICD-10-CM

## 2018-11-27 DIAGNOSIS — Z8249 Family history of ischemic heart disease and other diseases of the circulatory system: Secondary | ICD-10-CM

## 2018-11-27 DIAGNOSIS — Z818 Family history of other mental and behavioral disorders: Secondary | ICD-10-CM

## 2018-11-27 DIAGNOSIS — F329 Major depressive disorder, single episode, unspecified: Secondary | ICD-10-CM | POA: Diagnosis present

## 2018-11-27 DIAGNOSIS — Z833 Family history of diabetes mellitus: Secondary | ICD-10-CM

## 2018-11-27 DIAGNOSIS — J9621 Acute and chronic respiratory failure with hypoxia: Principal | ICD-10-CM | POA: Diagnosis present

## 2018-11-27 DIAGNOSIS — Z66 Do not resuscitate: Secondary | ICD-10-CM | POA: Diagnosis not present

## 2018-11-27 DIAGNOSIS — S199XXA Unspecified injury of neck, initial encounter: Secondary | ICD-10-CM | POA: Diagnosis not present

## 2018-11-27 DIAGNOSIS — R918 Other nonspecific abnormal finding of lung field: Secondary | ICD-10-CM | POA: Diagnosis not present

## 2018-11-27 DIAGNOSIS — J208 Acute bronchitis due to other specified organisms: Secondary | ICD-10-CM | POA: Diagnosis present

## 2018-11-27 DIAGNOSIS — J440 Chronic obstructive pulmonary disease with acute lower respiratory infection: Secondary | ICD-10-CM | POA: Diagnosis present

## 2018-11-27 DIAGNOSIS — N183 Chronic kidney disease, stage 3 (moderate): Secondary | ICD-10-CM | POA: Diagnosis present

## 2018-11-27 DIAGNOSIS — Z4682 Encounter for fitting and adjustment of non-vascular catheter: Secondary | ICD-10-CM | POA: Diagnosis not present

## 2018-11-27 DIAGNOSIS — G92 Toxic encephalopathy: Secondary | ICD-10-CM | POA: Diagnosis present

## 2018-11-27 DIAGNOSIS — G9341 Metabolic encephalopathy: Secondary | ICD-10-CM | POA: Diagnosis not present

## 2018-11-27 DIAGNOSIS — Z8349 Family history of other endocrine, nutritional and metabolic diseases: Secondary | ICD-10-CM

## 2018-11-27 DIAGNOSIS — R069 Unspecified abnormalities of breathing: Secondary | ICD-10-CM | POA: Diagnosis not present

## 2018-11-27 DIAGNOSIS — J9602 Acute respiratory failure with hypercapnia: Secondary | ICD-10-CM | POA: Diagnosis not present

## 2018-11-27 DIAGNOSIS — Z7982 Long term (current) use of aspirin: Secondary | ICD-10-CM

## 2018-11-27 DIAGNOSIS — J9601 Acute respiratory failure with hypoxia: Secondary | ICD-10-CM | POA: Diagnosis not present

## 2018-11-27 DIAGNOSIS — G2581 Restless legs syndrome: Secondary | ICD-10-CM | POA: Diagnosis present

## 2018-11-27 DIAGNOSIS — R404 Transient alteration of awareness: Secondary | ICD-10-CM | POA: Diagnosis not present

## 2018-11-27 DIAGNOSIS — R0689 Other abnormalities of breathing: Secondary | ICD-10-CM | POA: Diagnosis not present

## 2018-11-27 DIAGNOSIS — R410 Disorientation, unspecified: Secondary | ICD-10-CM | POA: Diagnosis not present

## 2018-11-27 DIAGNOSIS — E1122 Type 2 diabetes mellitus with diabetic chronic kidney disease: Secondary | ICD-10-CM | POA: Diagnosis present

## 2018-11-27 DIAGNOSIS — Z9049 Acquired absence of other specified parts of digestive tract: Secondary | ICD-10-CM

## 2018-11-27 DIAGNOSIS — R0602 Shortness of breath: Secondary | ICD-10-CM | POA: Diagnosis not present

## 2018-11-27 LAB — BLOOD GAS, ARTERIAL
Acid-Base Excess: 20.2 mmol/L — ABNORMAL HIGH (ref 0.0–2.0)
Acid-Base Excess: 22.4 mmol/L — ABNORMAL HIGH (ref 0.0–2.0)
Bicarbonate: 51.7 mmol/L — ABNORMAL HIGH (ref 20.0–28.0)
Bicarbonate: 52.7 mmol/L — ABNORMAL HIGH (ref 20.0–28.0)
FIO2: 0.5
FIO2: 0.36
O2 Saturation: 99.7 %
O2 Saturation: 98.9 %
Patient temperature: 37
Patient temperature: 37
pCO2 arterial: 98 mmHg (ref 32.0–48.0)
pCO2 arterial: 87 mmHg (ref 32.0–48.0)
pH, Arterial: 7.33 — ABNORMAL LOW (ref 7.350–7.450)
pH, Arterial: 7.39 (ref 7.350–7.450)
pO2, Arterial: 213 mmHg — ABNORMAL HIGH (ref 83.0–108.0)
pO2, Arterial: 129 mmHg — ABNORMAL HIGH (ref 83.0–108.0)

## 2018-11-27 LAB — CBC WITH DIFFERENTIAL/PLATELET
Abs Immature Granulocytes: 1.01 10*3/uL — ABNORMAL HIGH (ref 0.00–0.07)
Basophils Absolute: 0.1 10*3/uL (ref 0.0–0.1)
Basophils Relative: 0 %
Eosinophils Absolute: 0.2 10*3/uL (ref 0.0–0.5)
Eosinophils Relative: 1 %
HCT: 40.7 % (ref 36.0–46.0)
Hemoglobin: 12.2 g/dL (ref 12.0–15.0)
Immature Granulocytes: 4 %
Lymphocytes Relative: 9 %
Lymphs Abs: 2 10*3/uL (ref 0.7–4.0)
MCH: 30.1 pg (ref 26.0–34.0)
MCHC: 30 g/dL (ref 30.0–36.0)
MCV: 100.5 fL — ABNORMAL HIGH (ref 80.0–100.0)
Monocytes Absolute: 0.8 10*3/uL (ref 0.1–1.0)
Monocytes Relative: 4 %
Neutro Abs: 18.8 10*3/uL — ABNORMAL HIGH (ref 1.7–7.7)
Neutrophils Relative %: 82 %
Platelets: 276 10*3/uL (ref 150–400)
RBC: 4.05 MIL/uL (ref 3.87–5.11)
RDW: 14.5 % (ref 11.5–15.5)
WBC: 22.8 10*3/uL — ABNORMAL HIGH (ref 4.0–10.5)
nRBC: 0 % (ref 0.0–0.2)

## 2018-11-27 LAB — URINALYSIS, COMPLETE (UACMP) WITH MICROSCOPIC
Bilirubin Urine: NEGATIVE
Glucose, UA: NEGATIVE mg/dL
Ketones, ur: NEGATIVE mg/dL
Leukocytes,Ua: NEGATIVE
Nitrite: NEGATIVE
Protein, ur: 30 mg/dL — AB
Specific Gravity, Urine: 1.021 (ref 1.005–1.030)
pH: 5 (ref 5.0–8.0)

## 2018-11-27 LAB — COMPREHENSIVE METABOLIC PANEL
ALT: 15 U/L (ref 0–44)
AST: 17 U/L (ref 15–41)
Albumin: 3.6 g/dL (ref 3.5–5.0)
Alkaline Phosphatase: 78 U/L (ref 38–126)
Anion gap: 9 (ref 5–15)
BUN: 21 mg/dL (ref 8–23)
CO2: 45 mmol/L — ABNORMAL HIGH (ref 22–32)
Calcium: 9 mg/dL (ref 8.9–10.3)
Chloride: 87 mmol/L — ABNORMAL LOW (ref 98–111)
Creatinine, Ser: 0.75 mg/dL (ref 0.44–1.00)
GFR calc Af Amer: >60 mL/min (ref >60)
GFR calc non Af Amer: >60 mL/min (ref >60)
Glucose, Bld: 211 mg/dL — ABNORMAL HIGH (ref 70–99)
Potassium: 3.3 mmol/L — ABNORMAL LOW (ref 3.5–5.1)
Sodium: 141 mmol/L (ref 135–145)
Total Bilirubin: 0.6 mg/dL (ref 0.3–1.2)
Total Protein: 7.3 g/dL (ref 6.5–8.1)

## 2018-11-27 LAB — BASIC METABOLIC PANEL
Anion gap: 11 (ref 5–15)
BUN: 21 mg/dL (ref 8–23)
CO2: 42 mmol/L — ABNORMAL HIGH (ref 22–32)
Calcium: 8.5 mg/dL — ABNORMAL LOW (ref 8.9–10.3)
Chloride: 88 mmol/L — ABNORMAL LOW (ref 98–111)
Creatinine, Ser: 0.91 mg/dL (ref 0.44–1.00)
GFR calc Af Amer: >60 mL/min (ref >60)
GFR calc non Af Amer: >60 mL/min (ref >60)
Glucose, Bld: 194 mg/dL — ABNORMAL HIGH (ref 70–99)
Potassium: 3.4 mmol/L — ABNORMAL LOW (ref 3.5–5.1)
Sodium: 141 mmol/L (ref 135–145)

## 2018-11-27 LAB — INFLUENZA PANEL BY PCR (TYPE A & B)
Influenza A By PCR: NEGATIVE
Influenza B By PCR: NEGATIVE

## 2018-11-27 LAB — PHOSPHORUS: Phosphorus: 2.7 mg/dL (ref 2.5–4.6)

## 2018-11-27 LAB — PROCALCITONIN: Procalcitonin: <0.1 ng/mL

## 2018-11-27 LAB — BRAIN NATRIURETIC PEPTIDE: B Natriuretic Peptide: 55 pg/mL (ref 0.0–100.0)

## 2018-11-27 LAB — MRSA PCR SCREENING: MRSA by PCR: NEGATIVE

## 2018-11-27 LAB — MAGNESIUM: Magnesium: 2.4 mg/dL (ref 1.7–2.4)

## 2018-11-27 LAB — GLUCOSE, CAPILLARY
Glucose-Capillary: 185 mg/dL — ABNORMAL HIGH (ref 70–99)
Glucose-Capillary: 188 mg/dL — ABNORMAL HIGH (ref 70–99)
Glucose-Capillary: 183 mg/dL — ABNORMAL HIGH (ref 70–99)

## 2018-11-27 LAB — TROPONIN I: Troponin I: <0.03 ng/mL (ref <0.03)

## 2018-11-27 MED ORDER — CLONAZEPAM 0.5 MG PO TABS: 0.5000 mg | ORAL_TABLET | Freq: Two times a day (BID) | ORAL | Status: DC | PRN

## 2018-11-27 MED ORDER — LORAZEPAM 2 MG/ML IJ SOLN
0.5000 mg | Freq: Four times a day (QID) | INTRAMUSCULAR | Status: DC | PRN
  Administered 2018-11-27: 0.5 mg via INTRAVENOUS

## 2018-11-27 MED ORDER — INSULIN ASPART 100 UNIT/ML ~~LOC~~ SOLN
0.0000 [IU] | Freq: Three times a day (TID) | SUBCUTANEOUS | Status: DC
  Administered 2018-11-27: 2 [IU] via SUBCUTANEOUS
  Administered 2018-11-28: 3 [IU] via SUBCUTANEOUS
  Administered 2018-11-28: 2 [IU] via SUBCUTANEOUS
  Filled 2018-11-27 (×3): qty 1

## 2018-11-27 MED ORDER — ONDANSETRON HCL 4 MG PO TABS: 4.0000 mg | ORAL_TABLET | Freq: Four times a day (QID) | ORAL | Status: DC | PRN

## 2018-11-27 MED ORDER — LORAZEPAM 2 MG/ML IJ SOLN
INTRAMUSCULAR | Status: AC
  Administered 2018-11-27: 0.5 mg via INTRAVENOUS
  Filled 2018-11-27: qty 1

## 2018-11-27 MED ORDER — IPRATROPIUM-ALBUTEROL 0.5-2.5 (3) MG/3ML IN SOLN
3.0000 mL | Freq: Once | RESPIRATORY_TRACT | Status: AC
  Administered 2018-11-27: 3 mL via RESPIRATORY_TRACT
  Filled 2018-11-27: qty 3

## 2018-11-27 MED ORDER — SODIUM CHLORIDE 0.9 % IV SOLN
1.0000 g | Freq: Once | INTRAVENOUS | Status: DC
  Filled 2018-11-27: qty 1

## 2018-11-27 MED ORDER — SERTRALINE HCL 50 MG PO TABS: 100.0000 mg | ORAL_TABLET | Freq: Every day | ORAL | Status: DC

## 2018-11-27 MED ORDER — SODIUM CHLORIDE 0.9 % IV SOLN
500.0000 mg | INTRAVENOUS | Status: DC
  Administered 2018-11-27: 500 mg via INTRAVENOUS
  Filled 2018-11-27 (×3): qty 500

## 2018-11-27 MED ORDER — INSULIN ASPART 100 UNIT/ML ~~LOC~~ SOLN: 0.0000 [IU] | Freq: Every day | SUBCUTANEOUS | Status: DC

## 2018-11-27 MED ORDER — METHYLPREDNISOLONE SODIUM SUCC 40 MG IJ SOLR
40.0000 mg | Freq: Once | INTRAMUSCULAR | Status: AC
  Administered 2018-11-27: 40 mg via INTRAVENOUS
  Filled 2018-11-27: qty 1

## 2018-11-27 MED ORDER — PRAVASTATIN SODIUM 20 MG PO TABS: 40.0000 mg | ORAL_TABLET | Freq: Every day | ORAL | Status: DC

## 2018-11-27 MED ORDER — DEXMEDETOMIDINE HCL IN NACL 400 MCG/100ML IV SOLN
0.0000 ug/kg/h | INTRAVENOUS | Status: DC
  Administered 2018-11-27: 0.4 ug/kg/h via INTRAVENOUS

## 2018-11-27 MED ORDER — POTASSIUM CHLORIDE 10 MEQ/100ML IV SOLN
10.0000 meq | INTRAVENOUS | Status: AC
  Administered 2018-11-27 – 2018-11-28 (×3): 10 meq via INTRAVENOUS
  Filled 2018-11-27 (×3): qty 100

## 2018-11-27 MED ORDER — SODIUM CHLORIDE 0.9 % IV SOLN
2.0000 g | INTRAVENOUS | Status: DC
  Filled 2018-11-27 (×2): qty 20

## 2018-11-27 MED ORDER — BUDESONIDE 0.5 MG/2ML IN SUSP
0.5000 mg | Freq: Two times a day (BID) | RESPIRATORY_TRACT | Status: DC
  Administered 2018-11-27 – 2018-11-29 (×4): 0.5 mg via RESPIRATORY_TRACT
  Filled 2018-11-27 (×5): qty 2

## 2018-11-27 MED ORDER — METHYLPREDNISOLONE SODIUM SUCC 40 MG IJ SOLR
40.0000 mg | Freq: Two times a day (BID) | INTRAMUSCULAR | Status: DC
  Administered 2018-11-27 – 2018-11-29 (×5): 40 mg via INTRAVENOUS
  Filled 2018-11-27 (×5): qty 1

## 2018-11-27 MED ORDER — ASPIRIN EC 81 MG PO TBEC: 81.0000 mg | DELAYED_RELEASE_TABLET | Freq: Every day | ORAL | Status: DC

## 2018-11-27 MED ORDER — ACETAMINOPHEN 325 MG PO TABS: 650.0000 mg | ORAL_TABLET | Freq: Four times a day (QID) | ORAL | Status: DC | PRN

## 2018-11-27 MED ORDER — ACETAMINOPHEN 650 MG RE SUPP: 650.0000 mg | Freq: Four times a day (QID) | RECTAL | Status: DC | PRN

## 2018-11-27 MED ORDER — THEOPHYLLINE ER 400 MG PO TB24
400.0000 mg | ORAL_TABLET | Freq: Every day | ORAL | Status: DC
  Filled 2018-11-27: qty 1

## 2018-11-27 MED ORDER — ENOXAPARIN SODIUM 40 MG/0.4ML ~~LOC~~ SOLN
40.0000 mg | SUBCUTANEOUS | Status: DC
  Administered 2018-11-27 – 2018-12-02 (×5): 40 mg via SUBCUTANEOUS
  Filled 2018-11-27 (×5): qty 0.4

## 2018-11-27 MED ORDER — ONDANSETRON HCL 4 MG/2ML IJ SOLN: 4.0000 mg | Freq: Four times a day (QID) | INTRAMUSCULAR | Status: DC | PRN

## 2018-11-27 MED ORDER — IPRATROPIUM-ALBUTEROL 0.5-2.5 (3) MG/3ML IN SOLN
3.0000 mL | Freq: Four times a day (QID) | RESPIRATORY_TRACT | Status: DC
  Administered 2018-11-27 (×2): 3 mL via RESPIRATORY_TRACT
  Filled 2018-11-27: qty 3

## 2018-11-27 MED ORDER — MORPHINE SULFATE (PF) 2 MG/ML IV SOLN
2.0000 mg | INTRAVENOUS | Status: DC | PRN
  Administered 2018-11-27 – 2018-12-03 (×3): 2 mg via INTRAVENOUS
  Filled 2018-11-27 (×3): qty 1

## 2018-11-27 NOTE — ED Notes (Signed)
Patient transported to CT with RT/RN

## 2018-11-27 NOTE — H&P (Signed)
[facility] [place] [facility]   PATIENT NAME: [name]    MR#:  [id number]  DATE OF BIRTH:  [birth year]  DATE OF ADMISSION:  2018-11-29  PRIMARY CARE PHYSICIAN: [name], DO   REQUESTING/REFERRING PHYSICIAN: Dr Dorothea Glassman  CHIEF COMPLAINT:   Chief Complaint  Patient presents with  . Respiratory Distress    HISTORY OF PRESENT ILLNESS:  [name]  is a 68 y.o. female with a known history of COPD on oxygen presents with a fall.  She was in respiratory distress and was placed on BiPAP for elevated carbon dioxide on ABG.  The patient is confused but does answer a few yes or no questions.  She cannot describe anything further on the fall.  She does admit to wheezing.  She states that she has a little shortness of breath.  Patient states that she has been smoking.  Hospitalist services were contacted for further evaluation for acute hypercarbic respiratory failure.   PAST MEDICAL HISTORY:   Past Medical History:  Diagnosis Date  . Acute respiratory failure (HCC) 2015   twice  . Anxiety   . Chronic kidney disease   . COPD (chronic obstructive pulmonary disease) (HCC)   . Depression   . Diabetes mellitus without complication (HCC)   . Hyperlipidemia   . Hypertension   . Meniscus tear    right knee  . Obesity   . Restless leg syndrome   . Tobacco abuse     PAST SURGICAL HISTORY:   Past Surgical History:  Procedure Laterality Date  . CHOLECYSTECTOMY    . HERNIA REPAIR  1984  . TUBAL LIGATION  1984  . tumor removed      SOCIAL HISTORY:   Social History   Tobacco Use  . Smoking status: Former Smoker    Packs/day: 1.00    Years: 0.00    Pack years: 0.00    Types: Cigarettes, E-cigarettes    Last attempt to quit: 12/20/1913    Years since quitting: 105.0  . Smokeless tobacco: Never Used  . Tobacco comment: Happy that I quit still miss it  Substance Use Topics  . Alcohol use: No    FAMILY HISTORY:   Family History  Problem  Relation Age of Onset  . Alcohol abuse Mother   . Heart disease Mother   . Hypertension Mother   . Mental illness Mother   . Stroke Mother   . Heart disease Father   . Hyperlipidemia Father   . Drug abuse Sister   . Migraines Sister   . Drug abuse Brother   . Alcohol abuse Brother   . Heart disease Brother   . Hyperlipidemia Brother   . Bipolar disorder Daughter   . Anxiety disorder Daughter   . Bipolar disorder Son   . Alcohol abuse Son   . Alcohol abuse Brother   . Drug abuse Brother   . Drug abuse Brother   . Alcohol abuse Brother   . Asthma Brother   . Raynaud syndrome Brother   . Heart disease Maternal Grandmother   . Diabetes Paternal Grandmother     DRUG ALLERGIES:   Allergies  Allergen Reactions  . Percocet [Oxycodone-Acetaminophen] Nausea And Vomiting  . Chantix [Varenicline] Other (See Comments)    Severe headache and vomiting  . Fluoxetine Other (See Comments)    Headaches  . Lisinopril Cough  . Other     Other reaction(s): Unknown Contrast  . Verapamil Other (See Comments)  Lazy    REVIEW OF SYSTEMS:  Limited with altered mental status   RESPIRATORY: Positive for shortness of breath and wheezing. CARDIOVASCULAR: No chest pain. GASTROINTESTINAL: No nausea, vomiting, diarrhea or abdominal pain.    MEDICATIONS AT HOME:   Prior to Admission medications   Medication Sig Start Date End Date Taking? Authorizing Provider  albuterol (PROVENTIL HFA;VENTOLIN HFA) 108 (90 Base) MCG/ACT inhaler Inhale 1-2 puffs into the lungs every 4 (four) hours as needed for wheezing or shortness of breath. 08/05/18   Johnson, Megan P, DO  albuterol (PROVENTIL) (2.5 MG/3ML) 0.083% nebulizer solution USE 1 VIAL VIA NEBULIZER EVERY 6 HOURS AS NEEDED FOR WHEEZING OR SHORTNESS OF BREATH 10/17/18   Johnson, Megan P, DO  aspirin EC 81 MG tablet Take 81 mg by mouth daily.    [provider]  atenolol (TENORMIN) 50 MG tablet Take 1 tablet (50 mg total) by mouth daily.  08/05/18   Johnson, Megan P, DO  B Complex Vitamins (B COMPLEX PO) Take 1 Dose by mouth as directed.    [provider]  budesonide-formoterol (SYMBICORT) 160-4.5 MCG/ACT inhaler Inhale 2 puffs into the lungs 2 (two) times daily. 08/18/18   Johnson, Megan P, DO  Calcium Carbonate-Vit D-Min (CALCIUM 1200 PO) Take by mouth daily.     [provider]  clonazePAM (KLONOPIN) 1 MG tablet Take 1 tablet (1 mg total) by mouth daily as needed. 09/01/18 11/30/18  Olevia Perches P, DO  gabapentin (NEURONTIN) 100 MG capsule Take 2 capsules (200 mg total) by mouth 4 (four) times daily. 08/18/18   Johnson, Megan P, DO  glucose blood (ONE TOUCH ULTRA TEST) test strip TEST once daily as directed 08/05/18   Olevia Perches P, DO  hydrochlorothiazide (HYDRODIURIL) 25 MG tablet Take 1 tablet (25 mg total) by mouth daily. 08/18/18   Johnson, Megan P, DO  Lipoic Acid 150 MG CAPS Take by mouth.    [provider]  losartan (COZAAR) 25 MG tablet Take 1 tablet (25 mg total) by mouth daily. 08/05/18   Johnson, Megan P, DO  meclizine (ANTIVERT) 25 MG tablet Take 1 tablet (25 mg total) by mouth 3 (three) times daily as needed for dizziness. 08/05/18   Olevia Perches P, DO  Multiple Vitamins-Minerals (MULTIVITAMIN GUMMIES ADULTS PO) Take by mouth.    [provider]  pravastatin (PRAVACHOL) 40 MG tablet Take 1 tablet (40 mg total) by mouth at bedtime. 08/05/18   Johnson, Megan P, DO  predniSONE (DELTASONE) 10 MG tablet 6 tabs today and tomorrow, 5 tabs next 2 days, decrease by 1 every other day until gone. 09/15/18   Johnson, Megan P, DO  rOPINIRole (REQUIP) 0.5 MG tablet Take 1 tablet (0.5 mg total) by mouth 3 (three) times daily. 08/05/18   Johnson, Megan P, DO  sertraline (ZOLOFT) 100 MG tablet Take 1 tablet (100 mg total) by mouth daily. 08/05/18   Johnson, Megan P, DO  theophylline (UNIPHYL) 400 MG 24 hr tablet Take 1 tablet (400 mg total) by mouth daily. 08/05/18   Olevia Perches P, DO  tiotropium  (SPIRIVA HANDIHALER) 18 MCG inhalation capsule inhale the contents of one capsule in the handihaler once daily 08/05/18   Olevia Perches P, DO  Vitamin D, Cholecalciferol, 1000 units CAPS Take by mouth.    [provider]      VITAL SIGNS:  Blood pressure 134/66, pulse 77, resp. rate (!) 25, weight 104.3 kg, SpO2 94 %.  PHYSICAL EXAMINATION:  GENERAL:  68 y.o.-year-old patient  lying in the bed with  acute respiratory distress on BiPAP EYES: Pupils equal, round, reactive to light and accommodation. No scleral icterus. HEENT: Head atraumatic, normocephalic. Oropharynx and nasopharynx clear.  NECK:  Supple, no jugular venous distention. No thyroid enlargement, no tenderness.  LUNGS: Decreased breath sounds bilaterally, positive expiratory wheezing throughout lung field. CARDIOVASCULAR: S1, S2 normal. No murmurs, rubs, or gallops.  ABDOMEN: Soft, nontender, nondistended. Bowel sounds present. No organomegaly or mass.  EXTREMITIES: No pedal edema, cyanosis, or clubbing.  NEUROLOGIC: Patient moving her extremities PSYCHIATRIC: The patient is lethargic.  SKIN: No ulcerations lower extremities  LABORATORY PANEL:   CBC Recent Labs  Lab December 02, 2018 1256  WBC 22.8*  HGB 12.2  HCT 40.7  PLT 276   ------------------------------------------------------------------------------------------------------------------  Chemistries  Recent Labs  Lab 02-Dec-2018 1256  NA 141  K 3.3*  CL 87*  CO2 45*  GLUCOSE 211*  BUN 21  CREATININE 0.75  CALCIUM 9.0  AST 17  ALT 15  ALKPHOS 78  BILITOT 0.6   ------------------------------------------------------------------------------------------------------------------  Cardiac Enzymes Recent Labs  Lab 2018/12/02 1256  TROPONINI <0.03   ------------------------------------------------------------------------------------------------------------------  RADIOLOGY:  Ct Head Wo Contrast  Result Date: 12-02-2018 CLINICAL DATA:  Status post fall,  altered mental status EXAM: CT HEAD WITHOUT CONTRAST CT CERVICAL SPINE WITHOUT CONTRAST TECHNIQUE: Multidetector CT imaging of the head and cervical spine was performed following the standard protocol without intravenous contrast. Multiplanar CT image reconstructions of the cervical spine were also generated. COMPARISON:  None. FINDINGS: CT HEAD FINDINGS Brain: No evidence of acute infarction, hemorrhage, hydrocephalus, extra-axial collection or mass lesion/mass effect. Vascular: No hyperdense vessel or unexpected calcification. Skull: No acute calvarial injury.  Bilateral parietal burr holes. Sinuses/Orbits: Osteoma in the left posterior ethmoid sinus. Remainder the paranasal sinuses are clear. Visualized mastoid sinuses are clear. Visualized orbits demonstrate no focal abnormality. Other: None CT CERVICAL SPINE FINDINGS Alignment: 1-2 mm anterolisthesis of C3 on C4. Skull base and vertebrae: No acute fracture. No primary bone lesion or focal pathologic process. Soft tissues and spinal canal: No prevertebral fluid or swelling. No visible canal hematoma. Disc levels: Degenerative disc disease with disc height loss at C4-5, C5-6 and C6-7. Moderate right facet arthropathy at C2-3. Bilateral uncovertebral degenerative changes at C5-6 with mild foraminal narrowing. Upper chest: Lung apices are clear. Other: No fluid collection or hematoma. IMPRESSION: 1. No acute intracranial pathology. 2.  No acute osseous injury of the cervical spine. Electronically Signed   By: Elige Ko   On: 12/02/2018 15:02   Ct Cervical Spine Wo Contrast  Result Date: Dec 02, 2018 CLINICAL DATA:  Status post fall, altered mental status EXAM: CT HEAD WITHOUT CONTRAST CT CERVICAL SPINE WITHOUT CONTRAST TECHNIQUE: Multidetector CT imaging of the head and cervical spine was performed following the standard protocol without intravenous contrast. Multiplanar CT image reconstructions of the cervical spine were also generated. COMPARISON:  None.  FINDINGS: CT HEAD FINDINGS Brain: No evidence of acute infarction, hemorrhage, hydrocephalus, extra-axial collection or mass lesion/mass effect. Vascular: No hyperdense vessel or unexpected calcification. Skull: No acute calvarial injury.  Bilateral parietal burr holes. Sinuses/Orbits: Osteoma in the left posterior ethmoid sinus. Remainder the paranasal sinuses are clear. Visualized mastoid sinuses are clear. Visualized orbits demonstrate no focal abnormality. Other: None CT CERVICAL SPINE FINDINGS Alignment: 1-2 mm anterolisthesis of C3 on C4. Skull base and vertebrae: No acute fracture. No primary bone lesion or focal pathologic process. Soft tissues and spinal canal: No prevertebral fluid or swelling. No visible canal hematoma. Disc levels: Degenerative  disc disease with disc height loss at C4-5, C5-6 and C6-7. Moderate right facet arthropathy at C2-3. Bilateral uncovertebral degenerative changes at C5-6 with mild foraminal narrowing. Upper chest: Lung apices are clear. Other: No fluid collection or hematoma. IMPRESSION: 1. No acute intracranial pathology. 2.  No acute osseous injury of the cervical spine. Electronically Signed   By: Elige Ko   On: 12-08-18 15:02   Dg Chest Portable 1 View  Result Date: 12-08-2018 CLINICAL DATA:  Acute shortness of breath EXAM: PORTABLE CHEST 1 VIEW COMPARISON:  03/02/2018 and prior chest radiographs FINDINGS: Cardiomegaly again noted. Probable COPD noted. Mild chronic interstitial opacities in the LOWER lungs again noted. There is no evidence of focal airspace disease, pulmonary edema, suspicious pulmonary nodule/mass, pleural effusion, or pneumothorax. No acute bony abnormalities are identified. IMPRESSION: No evidence of acute cardiopulmonary disease. Cardiomegaly, COPD type changes and mild chronic LOWER lung interstitial opacities. Electronically Signed   By: Harmon Pier M.D.   On: Dec 08, 2018 13:32    EKG:   Sinus rhythm 86 bpm, right atrial enlargement, right  ventricular hypertrophy  IMPRESSION AND PLAN:   1.  Acute hypercarbic respiratory failure.  PCO2 of 98.  Patient placed on BiPAP.  Case discussed with critical care specialist. 2.  COPD exacerbation plus or minus pneumonia.  Empiric antibiotics with Rocephin and Zithromax.  Send off procalcitonin.  Solu-Medrol and nebulizer treatments. 3.  Acute encephalopathy likely secondary to high carbon dioxide.  Hold gabapentin and Requip.  Give smaller dose of Klonopin. 4.  Depression.  Continue Zoloft.  Decrease dose of Klonopin. 5.  Type 2 diabetes mellitus.  Put on sliding scale. 6.  Hyperlipidemia on Pravachol   All the records are reviewed and case discussed with ED provider. Management plans discussed with the patient, family and they are in agreement.  CODE STATUS: Full code as per the daughter.  She to the patient's home and look through her paperwork.  TOTAL TIME TAKING CARE OF THIS PATIENT: 50 minutes.    Alford Highland M.D on 12-08-2018 at 3:17 PM  Between 7am to 6pm - Pager - [phone]  After 6pm call admission pager [phone]  [facility]  [phone]  CC: Primary care physician; [name], DO

## 2018-11-27 NOTE — ED Notes (Signed)
Pt now oriented X3. Slightly drowsy.  Daughter on way.

## 2018-11-27 NOTE — ED Provider Notes (Signed)
[facility] Emergency Department Provider Note   ____________________________________________   First MD Initiated Contact with Patient 12/17/2018 1249     (approximate)  I have reviewed the triage vital signs and the nursing notes.   HISTORY  Chief Complaint Respiratory Distress  History limited by altered mental status  HPI [name] is a 68 y.o. female history of COPD and having had been put on BiPAP.  She apparently was going to the bathroom slipped fell is now having altered mental status.  Very short of breath EMS reports very little air movement when they arrived they gave her to albuterol nebs 1 DuoNeb Solu-Medrol and 2 g of magnesium.  She is now moving more air but is still confused cannot tell the day or date.         Past Medical History:  Diagnosis Date  . Acute respiratory failure (HCC) 2015   twice  . Anxiety   . Chronic kidney disease   . COPD (chronic obstructive pulmonary disease) (HCC)   . Depression   . Diabetes mellitus without complication (HCC)   . Hyperlipidemia   . Hypertension   . Meniscus tear    right knee  . Obesity   . Restless leg syndrome   . Tobacco abuse     Patient Active Problem List   Diagnosis Date Noted  . DNR (do not resuscitate) 08/05/2018  . Ventral hernia 11/22/2017  . Restless legs syndrome (RLS) 06/11/2015  . Type 2 diabetes, diet controlled (HCC) 03/22/2015  . Anxiety   . Hyperlipidemia   . Benign hypertensive renal disease   . Chronic kidney disease   . COPD, severe (HCC) 02/27/2014    Past Surgical History:  Procedure Laterality Date  . CHOLECYSTECTOMY    . HERNIA REPAIR  1984  . TUBAL LIGATION  1984  . tumor removed      Prior to Admission medications   Medication Sig Start Date End Date Taking? Authorizing Provider  albuterol (PROVENTIL HFA;VENTOLIN HFA) 108 (90 Base) MCG/ACT inhaler Inhale 1-2 puffs into the lungs every 4 (four) hours as needed for wheezing or shortness  of breath. 08/05/18   Johnson, Megan P, DO  albuterol (PROVENTIL) (2.5 MG/3ML) 0.083% nebulizer solution USE 1 VIAL VIA NEBULIZER EVERY 6 HOURS AS NEEDED FOR WHEEZING OR SHORTNESS OF BREATH 10/17/18   Johnson, Megan P, DO  aspirin EC 81 MG tablet Take 81 mg by mouth daily.    [provider]  atenolol (TENORMIN) 50 MG tablet Take 1 tablet (50 mg total) by mouth daily. 08/05/18   Johnson, Megan P, DO  B Complex Vitamins (B COMPLEX PO) Take 1 Dose by mouth as directed.    [provider]  budesonide-formoterol (SYMBICORT) 160-4.5 MCG/ACT inhaler Inhale 2 puffs into the lungs 2 (two) times daily. 08/18/18   Johnson, Megan P, DO  Calcium Carbonate-Vit D-Min (CALCIUM 1200 PO) Take by mouth daily.     [provider]  clonazePAM (KLONOPIN) 1 MG tablet Take 1 tablet (1 mg total) by mouth daily as needed. 09/01/18 11/30/18  Olevia Perches P, DO  diclofenac sodium (VOLTAREN) 1 % GEL Apply 4 g topically 4 (four) times daily. Patient not taking: Reported on 07/25/2018 01/13/18   Olevia Perches P, DO  gabapentin (NEURONTIN) 100 MG capsule Take 2 capsules (200 mg total) by mouth 4 (four) times daily. 08/18/18   Johnson, Megan P, DO  glucose blood (ONE TOUCH ULTRA TEST) test strip TEST once daily as directed  08/05/18   Johnson, Megan P, DO  hydrochlorothiazide (HYDRODIURIL) 25 MG tablet Take 1 tablet (25 mg total) by mouth daily. 08/18/18   Johnson, Megan P, DO  Lipoic Acid 150 MG CAPS Take by mouth.    [provider]  losartan (COZAAR) 25 MG tablet Take 1 tablet (25 mg total) by mouth daily. 08/05/18   Johnson, Megan P, DO  meclizine (ANTIVERT) 25 MG tablet Take 1 tablet (25 mg total) by mouth 3 (three) times daily as needed for dizziness. 08/05/18   Olevia Perches P, DO  Multiple Vitamins-Minerals (MULTIVITAMIN GUMMIES ADULTS PO) Take by mouth.    [provider]  pravastatin (PRAVACHOL) 40 MG tablet Take 1 tablet (40 mg total) by mouth at bedtime. 08/05/18   Johnson, Megan P,  DO  predniSONE (DELTASONE) 10 MG tablet 6 tabs today and tomorrow, 5 tabs next 2 days, decrease by 1 every other day until gone. 09/15/18   Johnson, Megan P, DO  rOPINIRole (REQUIP) 0.5 MG tablet Take 1 tablet (0.5 mg total) by mouth 3 (three) times daily. 08/05/18   Johnson, Megan P, DO  sertraline (ZOLOFT) 100 MG tablet Take 1 tablet (100 mg total) by mouth daily. 08/05/18   Johnson, Megan P, DO  theophylline (UNIPHYL) 400 MG 24 hr tablet Take 1 tablet (400 mg total) by mouth daily. 08/05/18   Olevia Perches P, DO  tiotropium (SPIRIVA HANDIHALER) 18 MCG inhalation capsule inhale the contents of one capsule in the handihaler once daily 08/05/18   Olevia Perches P, DO  Vitamin D, Cholecalciferol, 1000 units CAPS Take by mouth.    [provider]    Allergies Percocet [oxycodone-acetaminophen]; Chantix [varenicline]; Fluoxetine; Lisinopril; Other; and Verapamil  Family History  Problem Relation Age of Onset  . Alcohol abuse Mother   . Heart disease Mother   . Hypertension Mother   . Mental illness Mother   . Stroke Mother   . Heart disease Father   . Hyperlipidemia Father   . Drug abuse Sister   . Migraines Sister   . Drug abuse Brother   . Alcohol abuse Brother   . Heart disease Brother   . Hyperlipidemia Brother   . Bipolar disorder Daughter   . Anxiety disorder Daughter   . Bipolar disorder Son   . Alcohol abuse Son   . Alcohol abuse Brother   . Drug abuse Brother   . Drug abuse Brother   . Alcohol abuse Brother   . Asthma Brother   . Raynaud syndrome Brother   . Heart disease Maternal Grandmother   . Diabetes Paternal Grandmother     Social History Social History   Tobacco Use  . Smoking status: Former Smoker    Packs/day: 1.00    Years: 0.00    Pack years: 0.00    Types: Cigarettes, E-cigarettes    Last attempt to quit: 12/20/1913    Years since quitting: 105.0  . Smokeless tobacco: Never Used  . Tobacco comment: Happy that I quit still miss it  Substance  Use Topics  . Alcohol use: No  . Drug use: No    Review of Systems Is too limited by altered mental status ____________________________________________   PHYSICAL EXAM:  VITAL SIGNS: ED Triage Vitals  Enc Vitals Group     BP --      Pulse Rate 2018-12-25 1251 (!) 108     Resp 12/25/2018 1251 (!) 32     Temp --      Temp src --  SpO2 23-Dec-2018 1251 98 %     Weight 23-Dec-2018 1252 230 lb (104.3 kg)     Height --      Head Circumference --      Peak Flow --      Pain Score --      Pain Loc --      Pain Edu? --      Excl. in GC? --     Constitutional: Awake somewhat groggy cannot tell me her name or where she is Eyes: Conjunctivae are normal. PERRL. EOMI. Head: Atraumatic. Nose: No congestion/rhinnorhea. Mouth/Throat: Mucous membranes are moist.  Oropharynx non-erythematous. Neck: No stridor.  No apparent cervical spine tenderness to palpation Cardiovascular: Normal rate, regular rhythm. Grossly normal heart sounds.  Good peripheral circulation. Respiratory: Somewhat increased respiratory effort.  No retractions. Lungs decreased air movement Gastrointestinal: Soft and nontender. No distention. No abdominal bruits. No CVA tenderness. Musculoskeletal: No lower extremity tenderness nor edema. Neurologic: Speech is limited to 1 or 2 word responses which are not always appropriate. No gross focal neurologic deficits are appreciated.  Skin:  Skin is warm, dry and intact. No rash noted.   ____________________________________________   LABS (all labs ordered are listed, but only abnormal results are displayed)  Labs Reviewed  COMPREHENSIVE METABOLIC PANEL - Abnormal; Notable for the following components:      Result Value   Potassium 3.3 (*)    Chloride 87 (*)    CO2 45 (*)    Glucose, Bld 211 (*)    All other components within normal limits  CBC WITH DIFFERENTIAL/PLATELET - Abnormal; Notable for the following components:   WBC 22.8 (*)    MCV 100.5 (*)    Neutro Abs  18.8 (*)    Abs Immature Granulocytes 1.01 (*)    All other components within normal limits  BLOOD GAS, ARTERIAL - Abnormal; Notable for the following components:   pH, Arterial 7.33 (*)    pCO2 arterial 98 (*)    pO2, Arterial 213 (*)    Bicarbonate 51.7 (*)    Acid-Base Excess 20.2 (*)    All other components within normal limits  URINE CULTURE  CULTURE, BLOOD (ROUTINE X 2)  CULTURE, BLOOD (ROUTINE X 2)  TROPONIN I  BRAIN NATRIURETIC PEPTIDE  INFLUENZA PANEL BY PCR (TYPE A & B)  BLOOD GAS, ARTERIAL  URINALYSIS, COMPLETE (UACMP) WITH MICROSCOPIC   ____________________________________________  EKG  EKG read interpreted by me shows normal sinus rhythm rate of 86 rightward axis no acute ST-T wave changes very irregular baseline making it very difficult to read ____________________________________________  RADIOLOGY  ED MD interpretation: Chest x-ray read by radiology reviewed by me shows some cardiomegaly and chronic mild lower lobe infiltrates or haziness  Official radiology report(s): Dg Chest Portable 1 View  Result Date: 23-Dec-2018 CLINICAL DATA:  Acute shortness of breath EXAM: PORTABLE CHEST 1 VIEW COMPARISON:  03/02/2018 and prior chest radiographs FINDINGS: Cardiomegaly again noted. Probable COPD noted. Mild chronic interstitial opacities in the LOWER lungs again noted. There is no evidence of focal airspace disease, pulmonary edema, suspicious pulmonary nodule/mass, pleural effusion, or pneumothorax. No acute bony abnormalities are identified. IMPRESSION: No evidence of acute cardiopulmonary disease. Cardiomegaly, COPD type changes and mild chronic LOWER lung interstitial opacities. Electronically Signed   By: Harmon Pier M.D.   On: 12/23/18 13:32    ____________________________________________   PROCEDURES  Procedure(s) performed (including Critical Care):  Procedures   ____________________________________________   INITIAL IMPRESSION / ASSESSMENT AND PLAN /  ED COURSE  Patient's blood gas comes back with a CO2 of 98.  We get her on BiPAP.  This seems to help. ----------------------------------------- 2:48 PM on Nov 28, 2018 -----------------------------------------  We will get the patient upstairs she still on BiPAP she is altered still but may be a little bit less and waiting on second blood gas to come back.  Given her Maxipime for her 22,000 white count the chest x-ray was read as chronic lower lobe increased markings but this could be a pneumonia and there as well.  We will also check a urine.  Her flu was negative.  Do not see anything on her CT of the head although the official reading is not back yet.             ____________________________________________   FINAL CLINICAL IMPRESSION(S) / ED DIAGNOSES  Final diagnoses:  COPD exacerbation (HCC)  Respiratory distress  Acute respiratory failure with hypercapnia (HCC)  Possible pneumonia   ED Discharge Orders    None       Note:  This document was prepared using Dragon voice recognition software and may include unintentional dictation errors.    Arnaldo Natal, MD November 28, 2018 [phone]

## 2018-11-27 NOTE — ED Triage Notes (Addendum)
3L [place] home O2. 2 albuterol, 1 duoneb, 125mg  solumedrol, 2 gm mag by EMS.  No improvement per EMS. increased AMS during transport. bp 138/78, bg 228 with ems.  In distress on arrival. Per EMS called for fall. Pt found on knees in bathroom. altered on their arrival. Pt unable to give accurate hx. sats in 70s on EMS arrival.

## 2018-11-27 NOTE — ED Notes (Signed)
Pt on bipap

## 2018-11-27 NOTE — Consult Note (Signed)
Name: [name] MRN: [id number] DOB: [birth year]    ADMISSION DATE:  2018/12/08 CONSULTATION DATE: December 08, 2018  REFERRING MD : Dr. Renae Gloss   CHIEF COMPLAINT: Shortness of Breath   BRIEF PATIENT DESCRIPTION:  68 yo female admitted with acute on chronic respiratory failure secondary to AECOPD and viral bronchitis requiring Bipap   SIGNIFICANT EVENTS/STUDIES:  03/1-Pt admitted to the stepdown unit requiring Bipap   HISTORY OF PRESENT ILLNESS:   This is a 68 yo female with PMH of Anxiety, CKD, COPD, Home O2  via nasal canula, Depression, Restless Leg Syndrome, Type II Diabetes Mellitus, Hyperlipidemia, Obesity, Anxiety, and Former Smoker.  She presented to [facility] ER on 03/1 via EMS from home with altered mental status and shortness of breath.  EMS initially called due to a fall, upon EMS arrival pt found on her knees in the bathroom with altered mental status and hypoxia O2 sats in the 70's.  Per ER notes en route to the ER EMS administered 125 mg solumedrol, albuterol neb, duoneb, and 2g of magnesium.   Upon arrival to the ER pt remained confused, abg revealed pH 7.33/pCO2 98/pO2 213/bicarb 51.7.  She was placed on Bipap.  Lab results revealed K+ 3.3, CO2 45, BNP 55, troponin <0.03, wbc 22.8, and influenza pcr negative.  CXR negative for acute abnormality.  She was subsequently admitted to the stepdown unit by hospitalist team for additional workup and treatment.    PAST MEDICAL HISTORY :   has a past medical history of Acute respiratory failure (HCC) (2015), Anxiety, Chronic kidney disease, COPD (chronic obstructive pulmonary disease) (HCC), Depression, Diabetes mellitus without complication (HCC), Hyperlipidemia, Hypertension, Meniscus tear, Obesity, Restless leg syndrome, and Tobacco abuse.  has a past surgical history that includes Cholecystectomy; tumor removed; Tubal ligation (1984); and Hernia repair (1984). Prior to Admission medications   Medication Sig Start Date End Date Taking?  Authorizing Provider  albuterol (PROVENTIL HFA;VENTOLIN HFA) 108 (90 Base) MCG/ACT inhaler Inhale 1-2 puffs into the lungs every 4 (four) hours as needed for wheezing or shortness of breath. 08/05/18   Johnson, Megan P, DO  albuterol (PROVENTIL) (2.5 MG/3ML) 0.083% nebulizer solution USE 1 VIAL VIA NEBULIZER EVERY 6 HOURS AS NEEDED FOR WHEEZING OR SHORTNESS OF BREATH 10/17/18   Johnson, Megan P, DO  aspirin EC 81 MG tablet Take 81 mg by mouth daily.    [provider]  atenolol (TENORMIN) 50 MG tablet Take 1 tablet (50 mg total) by mouth daily. 08/05/18   Johnson, Megan P, DO  B Complex Vitamins (B COMPLEX PO) Take 1 Dose by mouth as directed.    [provider]  budesonide-formoterol (SYMBICORT) 160-4.5 MCG/ACT inhaler Inhale 2 puffs into the lungs 2 (two) times daily. 08/18/18   Johnson, Megan P, DO  Calcium Carbonate-Vit D-Min (CALCIUM 1200 PO) Take by mouth daily.     [provider]  clonazePAM (KLONOPIN) 1 MG tablet Take 1 tablet (1 mg total) by mouth daily as needed. 09/01/18 11/30/18  Olevia Perches P, DO  gabapentin (NEURONTIN) 100 MG capsule Take 2 capsules (200 mg total) by mouth 4 (four) times daily. 08/18/18   Johnson, Megan P, DO  glucose blood (ONE TOUCH ULTRA TEST) test strip TEST once daily as directed 08/05/18   Olevia Perches P, DO  hydrochlorothiazide (HYDRODIURIL) 25 MG tablet Take 1 tablet (25 mg total) by mouth daily. 08/18/18   Johnson, Megan P, DO  Lipoic Acid 150 MG CAPS Take by mouth.    [provider]  losartan (COZAAR) 25 MG tablet Take 1 tablet (25 mg total) by mouth daily. 08/05/18   Johnson, Megan P, DO  meclizine (ANTIVERT) 25 MG tablet Take 1 tablet (25 mg total) by mouth 3 (three) times daily as needed for dizziness. 08/05/18   Olevia Perches P, DO  Multiple Vitamins-Minerals (MULTIVITAMIN GUMMIES ADULTS PO) Take by mouth.    [provider]  pravastatin (PRAVACHOL) 40 MG tablet Take 1 tablet (40 mg total) by mouth at bedtime.  08/05/18   Johnson, Megan P, DO  predniSONE (DELTASONE) 10 MG tablet 6 tabs today and tomorrow, 5 tabs next 2 days, decrease by 1 every other day until gone. 09/15/18   Johnson, Megan P, DO  rOPINIRole (REQUIP) 0.5 MG tablet Take 1 tablet (0.5 mg total) by mouth 3 (three) times daily. 08/05/18   Johnson, Megan P, DO  sertraline (ZOLOFT) 100 MG tablet Take 1 tablet (100 mg total) by mouth daily. 08/05/18   Johnson, Megan P, DO  theophylline (UNIPHYL) 400 MG 24 hr tablet Take 1 tablet (400 mg total) by mouth daily. 08/05/18   Olevia Perches P, DO  tiotropium (SPIRIVA HANDIHALER) 18 MCG inhalation capsule inhale the contents of one capsule in the handihaler once daily 08/05/18   Olevia Perches P, DO  Vitamin D, Cholecalciferol, 1000 units CAPS Take by mouth.    [provider]   Allergies  Allergen Reactions  . Percocet [Oxycodone-Acetaminophen] Nausea And Vomiting  . Chantix [Varenicline] Other (See Comments)    Severe headache and vomiting  . Fluoxetine Other (See Comments)    Headaches  . Lisinopril Cough  . Other     Other reaction(s): Unknown Contrast  . Verapamil Other (See Comments)    Lazy    FAMILY HISTORY:  family history includes Alcohol abuse in her brother, brother, brother, mother, and son; Anxiety disorder in her daughter; Asthma in her brother; Bipolar disorder in her daughter and son; Diabetes in her paternal grandmother; Drug abuse in her brother, brother, brother, and sister; Heart disease in her brother, father, maternal grandmother, and mother; Hyperlipidemia in her brother and father; Hypertension in her mother; Mental illness in her mother; Migraines in her sister; Raynaud syndrome in her brother; Stroke in her mother. SOCIAL HISTORY:  reports that she quit smoking about 105 years ago. Her smoking use included cigarettes and e-cigarettes. She smoked 1.00 pack per day for 0.00 years. She has never used smokeless tobacco. She reports that she does not drink alcohol or  use drugs.  REVIEW OF SYSTEMS: Positives in BOLD Constitutional: Negative for fever, chills, weight loss, malaise/fatigue and diaphoresis.  HENT: Negative for hearing loss, ear pain, nosebleeds, congestion, sore throat, neck pain, tinnitus and ear discharge.   Eyes: Negative for blurred vision, double vision, photophobia, pain, discharge and redness.  Respiratory: cough, hemoptysis, sputum production, shortness of breath, wheezing and stridor.   Cardiovascular: Negative for chest pain, palpitations, orthopnea, claudication, leg swelling and PND.  Gastrointestinal: Negative for heartburn, nausea, vomiting, abdominal pain, diarrhea, constipation, blood in stool and melena.  Genitourinary: Negative for dysuria, urgency, frequency, hematuria and flank pain.  Musculoskeletal: Negative for myalgias, back pain, joint pain and falls.  Skin: Negative for itching and rash.  Neurological: Negative for dizziness, tingling, tremors, sensory change, speech change, focal weakness, seizures, loss of consciousness, weakness and headaches.  Endo/Heme/Allergies: Negative for environmental allergies and polydipsia. Does not bruise/bleed easily.  SUBJECTIVE:  No complaints at this time   VITAL SIGNS: Temp:  [96.9 F (36.1 C)-97.4 F (  36.3 C)] 97.4 F (36.3 C) (03/01 1543) Pulse Rate:  [76-108] 87 (03/01 1700) Resp:  [19-32] 19 (03/01 1700) BP: (99-137)/(58-87) 111/87 (03/01 1700) SpO2:  [78 %-99 %] 92 % (03/01 1700) FiO2 (%):  [28 %-35 %] 35 % (03/01 1550) Weight:  [98.1 kg-104.3 kg] 98.1 kg (03/01 1543)  PHYSICAL EXAMINATION: General: well developed, well nourished female, NAD  Neuro: alert and oriented, follows commands  HEENT: supple, no JVD  Cardiovascular: nsr, rrr, no R/G Lungs: diminished throughout, even, non labored Abdomen: +BS x4, soft, obese, non distended, non tender Musculoskeletal: normal bulk and tone, no edema  Skin: intact no lesions present   Recent Labs  Lab 12/16/2018 1256  NA  141  K 3.3*  CL 87*  CO2 45*  BUN 21  CREATININE 0.75  GLUCOSE 211*   Recent Labs  Lab 12/11/2018 1256  HGB 12.2  HCT 40.7  WBC 22.8*  PLT 276   Ct Head Wo Contrast  Result Date: 12/14/2018 CLINICAL DATA:  Status post fall, altered mental status EXAM: CT HEAD WITHOUT CONTRAST CT CERVICAL SPINE WITHOUT CONTRAST TECHNIQUE: Multidetector CT imaging of the head and cervical spine was performed following the standard protocol without intravenous contrast. Multiplanar CT image reconstructions of the cervical spine were also generated. COMPARISON:  None. FINDINGS: CT HEAD FINDINGS Brain: No evidence of acute infarction, hemorrhage, hydrocephalus, extra-axial collection or mass lesion/mass effect. Vascular: No hyperdense vessel or unexpected calcification. Skull: No acute calvarial injury.  Bilateral parietal burr holes. Sinuses/Orbits: Osteoma in the left posterior ethmoid sinus. Remainder the paranasal sinuses are clear. Visualized mastoid sinuses are clear. Visualized orbits demonstrate no focal abnormality. Other: None CT CERVICAL SPINE FINDINGS Alignment: 1-2 mm anterolisthesis of C3 on C4. Skull base and vertebrae: No acute fracture. No primary bone lesion or focal pathologic process. Soft tissues and spinal canal: No prevertebral fluid or swelling. No visible canal hematoma. Disc levels: Degenerative disc disease with disc height loss at C4-5, C5-6 and C6-7. Moderate right facet arthropathy at C2-3. Bilateral uncovertebral degenerative changes at C5-6 with mild foraminal narrowing. Upper chest: Lung apices are clear. Other: No fluid collection or hematoma. IMPRESSION: 1. No acute intracranial pathology. 2.  No acute osseous injury of the cervical spine. Electronically Signed   By: Elige Ko   On: 12/13/2018 15:02   Ct Cervical Spine Wo Contrast  Result Date: 12/16/2018 CLINICAL DATA:  Status post fall, altered mental status EXAM: CT HEAD WITHOUT CONTRAST CT CERVICAL SPINE WITHOUT CONTRAST  TECHNIQUE: Multidetector CT imaging of the head and cervical spine was performed following the standard protocol without intravenous contrast. Multiplanar CT image reconstructions of the cervical spine were also generated. COMPARISON:  None. FINDINGS: CT HEAD FINDINGS Brain: No evidence of acute infarction, hemorrhage, hydrocephalus, extra-axial collection or mass lesion/mass effect. Vascular: No hyperdense vessel or unexpected calcification. Skull: No acute calvarial injury.  Bilateral parietal burr holes. Sinuses/Orbits: Osteoma in the left posterior ethmoid sinus. Remainder the paranasal sinuses are clear. Visualized mastoid sinuses are clear. Visualized orbits demonstrate no focal abnormality. Other: None CT CERVICAL SPINE FINDINGS Alignment: 1-2 mm anterolisthesis of C3 on C4. Skull base and vertebrae: No acute fracture. No primary bone lesion or focal pathologic process. Soft tissues and spinal canal: No prevertebral fluid or swelling. No visible canal hematoma. Disc levels: Degenerative disc disease with disc height loss at C4-5, C5-6 and C6-7. Moderate right facet arthropathy at C2-3. Bilateral uncovertebral degenerative changes at C5-6 with mild foraminal narrowing. Upper chest: Lung apices are clear. Other:  No fluid collection or hematoma. IMPRESSION: 1. No acute intracranial pathology. 2.  No acute osseous injury of the cervical spine. Electronically Signed   By: Elige Ko   On: 12/10/2018 15:02   Dg Chest Portable 1 View  Result Date: 12/15/2018 CLINICAL DATA:  Acute shortness of breath EXAM: PORTABLE CHEST 1 VIEW COMPARISON:  03/02/2018 and prior chest radiographs FINDINGS: Cardiomegaly again noted. Probable COPD noted. Mild chronic interstitial opacities in the LOWER lungs again noted. There is no evidence of focal airspace disease, pulmonary edema, suspicious pulmonary nodule/mass, pleural effusion, or pneumothorax. No acute bony abnormalities are identified. IMPRESSION: No evidence of acute  cardiopulmonary disease. Cardiomegaly, COPD type changes and mild chronic LOWER lung interstitial opacities. Electronically Signed   By: Harmon Pier M.D.   On: 12/20/2018 13:32    ASSESSMENT / PLAN:  Acute on chronic respiratory failure secondary to AECOPD and viral bronchitis  Hx: Chronic Home O2   Prn Bipap for dyspnea and/or hypoxia  Scheduled and prn bronchodilator therapy  IV and nebulized steroids  Continue azithromycin   Type II Diabetes Mellitus CBG's ac/hs SSI   Depression  Continue zoloft  VTE px: subq lovenox   -Provided pt with [facility] for outpatient follow-up   Sonda Rumble, [facility]  Pulmonary/Critical Care Pager [phone] (please enter 7 digits) PCCM Consult Pager [phone] (please enter 7 digits)

## 2018-11-28 ENCOUNTER — Inpatient Hospital Stay: Payer: Medicare Other

## 2018-11-28 ENCOUNTER — Encounter: Payer: Medicare Other | Admitting: Family Medicine

## 2018-11-28 LAB — BLOOD GAS, ARTERIAL
Acid-Base Excess: 17.2 mmol/L — ABNORMAL HIGH (ref 0.0–2.0)
Acid-Base Excess: 14.6 mmol/L — ABNORMAL HIGH (ref 0.0–2.0)
Bicarbonate: 43.1 mmol/L — ABNORMAL HIGH (ref 20.0–28.0)
Bicarbonate: 39.9 mmol/L — ABNORMAL HIGH (ref 20.0–28.0)
FIO2: 0.35
FIO2: 0.28
MECHVT: 500 mL
MECHVT: 500 mL
O2 Saturation: 98.8 %
O2 Saturation: 93.6 %
PEEP: 5 cmH2O
PEEP: 5 cmH2O
Patient temperature: 37
Patient temperature: 37
RATE: 14 resp/min
RATE: 14 resp/min
pCO2 arterial: 54 mmHg — ABNORMAL HIGH (ref 32.0–48.0)
pCO2 arterial: 50 mmHg — ABNORMAL HIGH (ref 32.0–48.0)
pH, Arterial: 7.51 — ABNORMAL HIGH (ref 7.350–7.450)
pH, Arterial: 7.51 — ABNORMAL HIGH (ref 7.350–7.450)
pO2, Arterial: 114 mmHg — ABNORMAL HIGH (ref 83.0–108.0)
pO2, Arterial: 62 mmHg — ABNORMAL LOW (ref 83.0–108.0)

## 2018-11-28 LAB — PROTIME-INR
INR: 1.1 (ref 0.8–1.2)
Prothrombin Time: 13.6 seconds (ref 11.4–15.2)

## 2018-11-28 LAB — CBC
HCT: 32.8 % — ABNORMAL LOW (ref 36.0–46.0)
Hemoglobin: 9.9 g/dL — ABNORMAL LOW (ref 12.0–15.0)
MCH: 30 pg (ref 26.0–34.0)
MCHC: 30.2 g/dL (ref 30.0–36.0)
MCV: 99.4 fL (ref 80.0–100.0)
Platelets: 237 10*3/uL (ref 150–400)
RBC: 3.3 MIL/uL — ABNORMAL LOW (ref 3.87–5.11)
RDW: 14.3 % (ref 11.5–15.5)
WBC: 18.1 10*3/uL — ABNORMAL HIGH (ref 4.0–10.5)
nRBC: 0 % (ref 0.0–0.2)

## 2018-11-28 LAB — URINE CULTURE: Culture: NO GROWTH

## 2018-11-28 LAB — RESPIRATORY PANEL BY PCR

## 2018-11-28 LAB — HEMOGLOBIN A1C
Hgb A1c MFr Bld: 6.6 % — ABNORMAL HIGH (ref 4.8–5.6)
Mean Plasma Glucose: 142.72 mg/dL

## 2018-11-28 LAB — GLUCOSE, CAPILLARY
Glucose-Capillary: 200 mg/dL — ABNORMAL HIGH (ref 70–99)
Glucose-Capillary: 216 mg/dL — ABNORMAL HIGH (ref 70–99)
Glucose-Capillary: 138 mg/dL — ABNORMAL HIGH (ref 70–99)
Glucose-Capillary: 166 mg/dL — ABNORMAL HIGH (ref 70–99)

## 2018-11-28 LAB — BASIC METABOLIC PANEL
Anion gap: 11 (ref 5–15)
BUN: 27 mg/dL — ABNORMAL HIGH (ref 8–23)
CO2: 37 mmol/L — ABNORMAL HIGH (ref 22–32)
Calcium: 8 mg/dL — ABNORMAL LOW (ref 8.9–10.3)
Chloride: 92 mmol/L — ABNORMAL LOW (ref 98–111)
Creatinine, Ser: 1.05 mg/dL — ABNORMAL HIGH (ref 0.44–1.00)
GFR calc Af Amer: >60 mL/min (ref >60)
GFR calc non Af Amer: 55 mL/min — ABNORMAL LOW (ref >60)
Glucose, Bld: 221 mg/dL — ABNORMAL HIGH (ref 70–99)
Potassium: 3.8 mmol/L (ref 3.5–5.1)
Sodium: 140 mmol/L (ref 135–145)

## 2018-11-28 LAB — APTT: aPTT: 28 seconds (ref 24–36)

## 2018-11-28 LAB — PROCALCITONIN: Procalcitonin: <0.1 ng/mL

## 2018-11-28 LAB — MAGNESIUM: Magnesium: 2.1 mg/dL (ref 1.7–2.4)

## 2018-11-28 LAB — PHOSPHORUS: Phosphorus: 1.4 mg/dL — ABNORMAL LOW (ref 2.5–4.6)

## 2018-11-28 MED ORDER — VECURONIUM BROMIDE 10 MG IV SOLR
INTRAVENOUS | Status: AC
  Administered 2018-11-28: 10 mg via INTRAVENOUS
  Filled 2018-11-28: qty 10

## 2018-11-28 MED ORDER — CHLORHEXIDINE GLUCONATE 0.12% ORAL RINSE (MEDLINE KIT)
15.0000 mL | Freq: Two times a day (BID) | OROMUCOSAL | Status: DC
  Administered 2018-11-28 – 2018-12-03 (×12): 15 mL via OROMUCOSAL

## 2018-11-28 MED ORDER — FENTANYL CITRATE (PF) 100 MCG/2ML IJ SOLN
INTRAMUSCULAR | Status: AC
  Administered 2018-11-28: 200 ug via INTRAVENOUS
  Filled 2018-11-28: qty 4

## 2018-11-28 MED ORDER — SODIUM CHLORIDE 0.9 % IV SOLN
500.0000 mg | Freq: Every day | INTRAVENOUS | Status: DC
  Filled 2018-11-28: qty 500

## 2018-11-28 MED ORDER — INSULIN ASPART 100 UNIT/ML ~~LOC~~ SOLN
0.0000 [IU] | SUBCUTANEOUS | Status: DC
  Administered 2018-11-28: 1 [IU] via SUBCUTANEOUS
  Administered 2018-11-28: 2 [IU] via SUBCUTANEOUS
  Administered 2018-11-29: 3 [IU] via SUBCUTANEOUS
  Administered 2018-11-29: 2 [IU] via SUBCUTANEOUS
  Administered 2018-11-29: 3 [IU] via SUBCUTANEOUS
  Administered 2018-11-29 (×2): 2 [IU] via SUBCUTANEOUS
  Administered 2018-11-29: 1 [IU] via SUBCUTANEOUS
  Administered 2018-11-30: 2 [IU] via SUBCUTANEOUS
  Administered 2018-11-30: 3 [IU] via SUBCUTANEOUS
  Administered 2018-11-30: 2 [IU] via SUBCUTANEOUS
  Administered 2018-11-30: 5 [IU] via SUBCUTANEOUS
  Administered 2018-11-30 – 2018-12-01 (×4): 2 [IU] via SUBCUTANEOUS
  Administered 2018-12-01: 3 [IU] via SUBCUTANEOUS
  Administered 2018-12-01: 1 [IU] via SUBCUTANEOUS
  Administered 2018-12-01: 2 [IU] via SUBCUTANEOUS
  Administered 2018-12-01: 1 [IU] via SUBCUTANEOUS
  Administered 2018-12-02: 2 [IU] via SUBCUTANEOUS
  Administered 2018-12-02: 3 [IU] via SUBCUTANEOUS
  Administered 2018-12-02 (×2): 2 [IU] via SUBCUTANEOUS
  Administered 2018-12-02 (×2): 1 [IU] via SUBCUTANEOUS
  Administered 2018-12-02 – 2018-12-03 (×2): 2 [IU] via SUBCUTANEOUS
  Administered 2018-12-03: 3 [IU] via SUBCUTANEOUS
  Filled 2018-11-28 (×26): qty 1

## 2018-11-28 MED ORDER — NOREPINEPHRINE 4 MG/250ML-% IV SOLN
INTRAVENOUS | Status: AC
  Administered 2018-11-28: 2 ug/min via INTRAVENOUS
  Filled 2018-11-28: qty 250

## 2018-11-28 MED ORDER — PANTOPRAZOLE SODIUM 40 MG IV SOLR: 40.0000 mg | Freq: Every day | INTRAVENOUS | Status: DC

## 2018-11-28 MED ORDER — SERTRALINE HCL 50 MG PO TABS
100.0000 mg | ORAL_TABLET | Freq: Every day | ORAL | Status: DC
  Administered 2018-11-28 – 2018-12-02 (×5): 100 mg
  Filled 2018-11-28 (×5): qty 2

## 2018-11-28 MED ORDER — NOREPINEPHRINE 4 MG/250ML-% IV SOLN
0.0000 ug/min | INTRAVENOUS | Status: DC
  Administered 2018-11-28: 2 ug/min via INTRAVENOUS
  Administered 2018-11-29: 6 ug/min via INTRAVENOUS
  Administered 2018-11-29: 4 ug/min via INTRAVENOUS
  Filled 2018-11-28: qty 250

## 2018-11-28 MED ORDER — MIDAZOLAM HCL 2 MG/2ML IJ SOLN
INTRAMUSCULAR | Status: AC
  Administered 2018-11-28: 4 mg via INTRAVENOUS
  Filled 2018-11-28: qty 4

## 2018-11-28 MED ORDER — ETOMIDATE 2 MG/ML IV SOLN
INTRAVENOUS | Status: AC
  Administered 2018-11-28: 20 mg via INTRAVENOUS
  Filled 2018-11-28: qty 10

## 2018-11-28 MED ORDER — SODIUM CHLORIDE 0.9 % IV SOLN
500.0000 mg | Freq: Every day | INTRAVENOUS | Status: DC
  Administered 2018-11-28 – 2018-12-01 (×4): 500 mg via INTRAVENOUS
  Filled 2018-11-28 (×5): qty 500

## 2018-11-28 MED ORDER — LEVOFLOXACIN IN D5W 500 MG/100ML IV SOLN
500.0000 mg | INTRAVENOUS | Status: DC
  Filled 2018-11-28: qty 100

## 2018-11-28 MED ORDER — ACETAMINOPHEN 325 MG PO TABS: 650.0000 mg | ORAL_TABLET | Freq: Four times a day (QID) | ORAL | Status: DC | PRN

## 2018-11-28 MED ORDER — ASPIRIN 81 MG PO CHEW
81.0000 mg | CHEWABLE_TABLET | Freq: Every day | ORAL | Status: DC
  Administered 2018-11-28 – 2018-12-03 (×6): 81 mg
  Filled 2018-11-28 (×6): qty 1

## 2018-11-28 MED ORDER — IPRATROPIUM-ALBUTEROL 0.5-2.5 (3) MG/3ML IN SOLN
RESPIRATORY_TRACT | Status: AC
  Administered 2018-11-28: 3 mL
  Filled 2018-11-28: qty 3

## 2018-11-28 MED ORDER — FENTANYL 2500MCG IN NS 250ML (10MCG/ML) PREMIX INFUSION
25.0000 ug/h | INTRAVENOUS | Status: DC
  Administered 2018-11-28: 400 ug/h via INTRAVENOUS
  Administered 2018-11-28: 25 ug/h via INTRAVENOUS
  Administered 2018-11-28: 300 ug/h via INTRAVENOUS
  Administered 2018-11-29: 400 ug/h via INTRAVENOUS
  Administered 2018-11-29 (×2): 300 ug/h via INTRAVENOUS
  Administered 2018-11-29 – 2018-11-30 (×4): 400 ug/h via INTRAVENOUS
  Administered 2018-11-30: 350 ug/h via INTRAVENOUS
  Administered 2018-12-01: 200 ug/h via INTRAVENOUS
  Administered 2018-12-01: 300 ug/h via INTRAVENOUS
  Administered 2018-12-02: 100 ug/h via INTRAVENOUS
  Administered 2018-12-03: 75 ug/h via INTRAVENOUS
  Filled 2018-11-28 (×15): qty 250

## 2018-11-28 MED ORDER — SODIUM CHLORIDE 0.9% FLUSH
10.0000 mL | Freq: Two times a day (BID) | INTRAVENOUS | Status: DC
  Administered 2018-11-28 (×2): 10 mL
  Administered 2018-11-29: 30 mL
  Administered 2018-11-29: 10 mL
  Administered 2018-11-30: 30 mL
  Administered 2018-11-30 – 2018-12-03 (×6): 10 mL

## 2018-11-28 MED ORDER — MIDAZOLAM HCL 2 MG/2ML IJ SOLN
1.0000 mg | INTRAMUSCULAR | Status: DC | PRN
  Administered 2018-11-28 – 2018-12-02 (×13): 1 mg via INTRAVENOUS
  Filled 2018-11-28 (×14): qty 2

## 2018-11-28 MED ORDER — PRAVASTATIN SODIUM 20 MG PO TABS
40.0000 mg | ORAL_TABLET | Freq: Every day | ORAL | Status: DC
  Administered 2018-11-28 – 2018-12-02 (×5): 40 mg
  Filled 2018-11-28 (×5): qty 2

## 2018-11-28 MED ORDER — MIDAZOLAM HCL 2 MG/2ML IJ SOLN
1.0000 mg | INTRAMUSCULAR | Status: AC | PRN
  Administered 2018-11-28 – 2018-11-29 (×3): 1 mg via INTRAVENOUS
  Filled 2018-11-28 (×3): qty 2

## 2018-11-28 MED ORDER — DEXMEDETOMIDINE HCL IN NACL 400 MCG/100ML IV SOLN
0.4000 ug/kg/h | INTRAVENOUS | Status: DC
  Administered 2018-11-28: 0.4 ug/kg/h via INTRAVENOUS
  Administered 2018-11-28: 0.7 ug/kg/h via INTRAVENOUS
  Administered 2018-11-28: 0.5 ug/kg/h via INTRAVENOUS
  Administered 2018-11-29: 1.2 ug/kg/h via INTRAVENOUS
  Administered 2018-11-29: 0.701 ug/kg/h via INTRAVENOUS
  Administered 2018-11-29: 1 ug/kg/h via INTRAVENOUS
  Administered 2018-11-29 – 2018-11-30 (×4): 1.2 ug/kg/h via INTRAVENOUS
  Filled 2018-11-28 (×9): qty 100

## 2018-11-28 MED ORDER — SODIUM CHLORIDE 0.9% FLUSH: 10.0000 mL | INTRAVENOUS | Status: DC | PRN

## 2018-11-28 MED ORDER — PANTOPRAZOLE SODIUM 40 MG PO PACK
40.0000 mg | PACK | Freq: Every day | ORAL | Status: DC
  Administered 2018-11-28 – 2018-12-03 (×6): 40 mg
  Filled 2018-11-28 (×6): qty 20

## 2018-11-28 MED ORDER — ACETAMINOPHEN 650 MG RE SUPP: 650.0000 mg | Freq: Four times a day (QID) | RECTAL | Status: DC | PRN

## 2018-11-28 MED ORDER — VECURONIUM BROMIDE 10 MG IV SOLR
10.0000 mg | Freq: Once | INTRAVENOUS | Status: AC
  Administered 2018-11-28: 10 mg via INTRAVENOUS

## 2018-11-28 MED ORDER — POTASSIUM PHOSPHATES 15 MMOLE/5ML IV SOLN
30.0000 mmol | Freq: Once | INTRAVENOUS | Status: AC
  Administered 2018-11-28: 30 mmol via INTRAVENOUS
  Filled 2018-11-28: qty 10

## 2018-11-28 MED ORDER — FENTANYL CITRATE (PF) 100 MCG/2ML IJ SOLN
200.0000 ug | Freq: Once | INTRAMUSCULAR | Status: AC
  Administered 2018-11-28: 200 ug via INTRAVENOUS

## 2018-11-28 MED ORDER — FENTANYL CITRATE (PF) 100 MCG/2ML IJ SOLN
50.0000 ug | Freq: Once | INTRAMUSCULAR | Status: AC
  Administered 2018-11-28: 50 ug via INTRAVENOUS

## 2018-11-28 MED ORDER — FENTANYL BOLUS VIA INFUSION
50.0000 ug | INTRAVENOUS | Status: DC | PRN
  Administered 2018-11-28 – 2018-12-01 (×11): 50 ug via INTRAVENOUS
  Filled 2018-11-28: qty 50

## 2018-11-28 MED ORDER — MIDAZOLAM HCL 2 MG/2ML IJ SOLN
4.0000 mg | Freq: Once | INTRAMUSCULAR | Status: AC
  Administered 2018-11-28: 4 mg via INTRAVENOUS

## 2018-11-28 MED ORDER — ORAL CARE MOUTH RINSE
15.0000 mL | OROMUCOSAL | Status: DC
  Administered 2018-11-28 – 2018-12-03 (×53): 15 mL via OROMUCOSAL

## 2018-11-28 MED ORDER — IPRATROPIUM-ALBUTEROL 0.5-2.5 (3) MG/3ML IN SOLN
3.0000 mL | RESPIRATORY_TRACT | Status: DC
  Administered 2018-11-28 – 2018-12-03 (×33): 3 mL via RESPIRATORY_TRACT
  Filled 2018-11-28 (×33): qty 3

## 2018-11-28 MED ORDER — ETOMIDATE 2 MG/ML IV SOLN
20.0000 mg | Freq: Once | INTRAVENOUS | Status: AC
  Administered 2018-11-28: 20 mg via INTRAVENOUS

## 2018-11-28 MED ORDER — BISACODYL 10 MG RE SUPP: 10.0000 mg | Freq: Every day | RECTAL | Status: DC | PRN

## 2018-11-28 NOTE — Progress Notes (Signed)
PHARMACIST - PHYSICIAN COMMUNICATION  CONCERNING: IV to Oral Route Change Policy  RECOMMENDATION: This patient is receiving pantoprazole by the intravenous route.  Based on criteria approved by the Pharmacy and Therapeutics Committee, the intravenous medication(s) is/are being converted to the equivalent oral dose form(s).   DESCRIPTION: These criteria include:  The patient is eating (either orally or via tube) and/or has been taking other orally administered medications for a least 24 hours  The patient has no evidence of active gastrointestinal bleeding or impaired GI absorption (gastrectomy, short bowel, patient on TNA or NPO).  If you have questions about this conversion, please contact the Pharmacy Department   []   [phone] )  [facility]   Bernetta Sutley L, [facility] 11/28/2018 9:35 AM

## 2018-11-28 NOTE — Procedures (Signed)
Central Venous Catheter Insertion Procedure Note [name] [id number] 11-06-1950  Procedure: Insertion of Central Venous Catheter Indications: Assessment of intravascular volume, Drug and/or fluid administration and Frequent blood sampling  Procedure Details Consent: Risks of procedure as well as the alternatives and risks of each were explained to the (patient/caregiver).  Consent for procedure obtained. Time Out: Verified patient identification, verified procedure, site/side was marked, verified correct patient position, special equipment/implants available, medications/allergies/relevent history reviewed, required imaging and test results available.  Performed  Maximum sterile technique was used including antiseptics, cap, gloves, gown, hand hygiene, mask and sheet. Skin prep: Chlorhexidine; local anesthetic administered A antimicrobial bonded/coated triple lumen catheter was placed in the right internal jugular vein using the Seldinger technique.  Evaluation Blood flow good Complications: No apparent complications Patient did tolerate procedure well. Chest X-ray ordered to verify placement.  CXR: normal.  Procedure performed under direct supervision of Dr.Kasa. Ultrasound utilized for realtime vessel cannulation  [name] ANP-BC  Pulmonary and Critical Care Medicine [facility] Pager [phone] or [phone]  NB: This document was prepared using Dragon voice recognition software and may include unintentional dictation errors.    11/28/2018, 1:49 AM

## 2018-11-28 NOTE — Progress Notes (Signed)
Patient ID: [name], female   DOB: 1951/01/01, 68 y.o.   MRN: [id number] She was evaluated by Dr. Belia Heman earlier this morning please see his note.  She was asynchronous on the ventilator this morning.  I have performed multidisciplinary rounds with the ICU team and with RT.  Made adjustments to the ventilator to reflect 8 mL's per kilogram of PBW for the patient.  She appears to be settling better with the settings.  See orders for details.  Currently hemodynamically stable.

## 2018-11-28 NOTE — Procedures (Signed)
Endotracheal Intubation: Patient required placement of an artificial airway secondary to Respiratory Failure  Consent: Emergent.   Hand washing performed prior to starting the procedure.   Medications administered for sedation prior to procedure:  Midazolam 4 mg IV,  ETOMIDATE 10 mg IV, Fentanyl 100 mcg IV.    A time out procedure was called and correct patient, name, & ID confirmed. Needed supplies and equipment were assembled and checked to include ETT, 10 ml syringe, Glidescope, Mac and Miller blades, suction, oxygen and bag mask valve, end tidal CO2 monitor.   Patient was positioned to align the mouth and pharynx to facilitate visualization of the glottis.   Heart rate, SpO2 and blood pressure was continuously monitored during the procedure. Pre-oxygenation was conducted prior to intubation and endotracheal tube was placed through the vocal cords into the trachea.     The artificial airway was placed under direct visualization via glidescope route using a 7.5 ETT on the first attempt.  ETT was secured at 24 cm mark.  Placement was confirmed by auscuitation of lungs with good breath sounds bilaterally and no stomach sounds.  Condensation was noted on endotracheal tube.   Pulse ox 98%.  CO2 detector in place with appropriate color change.   Complications: None .   Operator: Handy Mcloud.   Chest radiograph ordered and pending.   Comments: OGT placed via glidescope.  Corrin Parker, M.D.  [facility]  Medical Director [place] Director [facility] Cardio-Pulmonary Department

## 2018-11-28 NOTE — Progress Notes (Signed)
[facility] - [place] [facility]   PATIENT NAME: [name]    MR#:  [id number]  DATE OF BIRTH:  04-02-1951  SUBJECTIVE:   Patient came in with increasing shortness of breath and cough. She failed BiPAP in the ER had to be emergently intubated last night. Currently  IV Versed and fentanyl. REVIEW OF SYSTEMS:   Review of Systems  Unable to perform ROS: Intubated   Tolerating Diet:TF   DRUG ALLERGIES:   Allergies  Allergen Reactions  . Percocet [Oxycodone-Acetaminophen] Nausea And Vomiting  . Chantix [Varenicline] Other (See Comments)    Severe headache and vomiting  . Fluoxetine Other (See Comments)    Headaches  . Lisinopril Cough  . Other     Other reaction(s): Unknown Contrast  . Verapamil Other (See Comments)    Lazy    VITALS:  Blood pressure 107/69, pulse 68, temperature 98.5 F (36.9 C), resp. rate (!) 31, height 5\' 2"  (1.575 m), weight 98.1 kg, SpO2 100 %.  PHYSICAL EXAMINATION:   Physical Exam  GENERAL:  68 y.o.-year-old patient lying in the bed with no acute distress. Morbidly obese critically ill  EYES: Pupils equal, round, reactive to light and accommodation. No scleral icterus.  HEENT: Head atraumatic, normocephalic. Oropharynx and nasopharynx clear. Intubated on the ventilator NECK:  Supple, no jugular venous distention. No thyroid enlargement, no tenderness.  LUNGS: Normal breath sounds bilaterally, no wheezing, rales, rhonchi. No use of accessory muscles of respiration.  CARDIOVASCULAR: S1, S2 normal. No murmurs, rubs, or gallops. Mild tachycardia ABDOMEN: Soft, nontender, nondistended. Bowel sounds present. No organomegaly or mass.  EXTREMITIES: No cyanosis, clubbing or edema b/l.    NEUROLOGIC: on the ventilator  PSYCHIATRIC:  patient is sedated  SKIN: No obvious rash, lesion, or ulcer.   LABORATORY PANEL:  CBC Recent Labs  Lab 11/28/18 0459  WBC 18.1*  HGB 9.9*  HCT 32.8*  PLT 237    Chemistries  Recent Labs   Lab 12/19/2018 1256  11/28/18 0459  NA 141   < > 140  K 3.3*   < > 3.8  CL 87*   < > 92*  CO2 45*   < > 37*  GLUCOSE 211*   < > 221*  BUN 21   < > 27*  CREATININE 0.75   < > 1.05*  CALCIUM 9.0   < > 8.0*  MG  --    < > 2.1  AST 17  --   --   ALT 15  --   --   ALKPHOS 78  --   --   BILITOT 0.6  --   --    < > = values in this interval not displayed.   Cardiac Enzymes Recent Labs  Lab 12/25/2018 1256  TROPONINI <0.03   RADIOLOGY:  Dg Abd 1 View  Result Date: 11/28/2018 CLINICAL DATA:  OG tube placement. EXAM: ABDOMEN - 1 VIEW COMPARISON:  None. FINDINGS: Tip of the enteric tube below the diaphragm in the stomach, the side port is not well visualized but likely just beyond the gastroesophageal junction. No evidence of free air in the upper abdomen. Cholecystectomy clips noted. IMPRESSION: Tip of the enteric tube below the diaphragm in the stomach. The side-port is not well visualized but likely just beyond the gastroesophageal junction. Electronically Signed   By: Narda Rutherford M.D.   On: 11/28/2018 01:34   Ct Head Wo Contrast  Result Date: 12/17/2018 CLINICAL DATA:  Status post fall, altered mental  status EXAM: CT HEAD WITHOUT CONTRAST CT CERVICAL SPINE WITHOUT CONTRAST TECHNIQUE: Multidetector CT imaging of the head and cervical spine was performed following the standard protocol without intravenous contrast. Multiplanar CT image reconstructions of the cervical spine were also generated. COMPARISON:  None. FINDINGS: CT HEAD FINDINGS Brain: No evidence of acute infarction, hemorrhage, hydrocephalus, extra-axial collection or mass lesion/mass effect. Vascular: No hyperdense vessel or unexpected calcification. Skull: No acute calvarial injury.  Bilateral parietal burr holes. Sinuses/Orbits: Osteoma in the left posterior ethmoid sinus. Remainder the paranasal sinuses are clear. Visualized mastoid sinuses are clear. Visualized orbits demonstrate no focal abnormality. Other: None CT CERVICAL  SPINE FINDINGS Alignment: 1-2 mm anterolisthesis of C3 on C4. Skull base and vertebrae: No acute fracture. No primary bone lesion or focal pathologic process. Soft tissues and spinal canal: No prevertebral fluid or swelling. No visible canal hematoma. Disc levels: Degenerative disc disease with disc height loss at C4-5, C5-6 and C6-7. Moderate right facet arthropathy at C2-3. Bilateral uncovertebral degenerative changes at C5-6 with mild foraminal narrowing. Upper chest: Lung apices are clear. Other: No fluid collection or hematoma. IMPRESSION: 1. No acute intracranial pathology. 2.  No acute osseous injury of the cervical spine. Electronically Signed   By: Elige Ko   On: 12/12/2018 15:02   Ct Cervical Spine Wo Contrast  Result Date: 12/24/2018 CLINICAL DATA:  Status post fall, altered mental status EXAM: CT HEAD WITHOUT CONTRAST CT CERVICAL SPINE WITHOUT CONTRAST TECHNIQUE: Multidetector CT imaging of the head and cervical spine was performed following the standard protocol without intravenous contrast. Multiplanar CT image reconstructions of the cervical spine were also generated. COMPARISON:  None. FINDINGS: CT HEAD FINDINGS Brain: No evidence of acute infarction, hemorrhage, hydrocephalus, extra-axial collection or mass lesion/mass effect. Vascular: No hyperdense vessel or unexpected calcification. Skull: No acute calvarial injury.  Bilateral parietal burr holes. Sinuses/Orbits: Osteoma in the left posterior ethmoid sinus. Remainder the paranasal sinuses are clear. Visualized mastoid sinuses are clear. Visualized orbits demonstrate no focal abnormality. Other: None CT CERVICAL SPINE FINDINGS Alignment: 1-2 mm anterolisthesis of C3 on C4. Skull base and vertebrae: No acute fracture. No primary bone lesion or focal pathologic process. Soft tissues and spinal canal: No prevertebral fluid or swelling. No visible canal hematoma. Disc levels: Degenerative disc disease with disc height loss at C4-5, C5-6 and  C6-7. Moderate right facet arthropathy at C2-3. Bilateral uncovertebral degenerative changes at C5-6 with mild foraminal narrowing. Upper chest: Lung apices are clear. Other: No fluid collection or hematoma. IMPRESSION: 1. No acute intracranial pathology. 2.  No acute osseous injury of the cervical spine. Electronically Signed   By: Elige Ko   On: 12/08/2018 15:02   Dg Chest Port 1 View  Result Date: 11/28/2018 CLINICAL DATA:  Line placement EXAM: PORTABLE CHEST 1 VIEW COMPARISON:  11/28/2018, 11/27/2010 FINDINGS: Endotracheal tube tip is about 2 cm superior to the carina. Right-sided central venous catheter tip over the SVC. No pneumothorax. Hyperinflation with mild bronchitic changes at the bases. Emphysematous disease. Stable slightly enlarged cardiomediastinal silhouette. IMPRESSION: 1. Endotracheal tube tip about 2 cm superior to carina. 2. Right-sided central venous catheter tip over the SVC. No pneumothorax 3. Hyperinflation.  Stable interstitial opacities at the bases. Electronically Signed   By: Jasmine Pang M.D.   On: 11/28/2018 02:38   Portable Chest X-ray  Result Date: 11/28/2018 CLINICAL DATA:  Endotracheal tube placement. EXAM: PORTABLE CHEST 1 VIEW COMPARISON:  Radiographs yesterday. FINDINGS: Endotracheal tube tip 3.6 cm from the carina. Enteric tube in  place, tip below the diaphragm, better assessed on concurrent abdominal radiographs. Mild cardiomegaly. Hyperinflation with chronic interstitial coarsening. No acute airspace disease, pneumothorax or large pleural effusion. IMPRESSION: 1. Endotracheal tube tip 3.6 cm from the carina. Enteric tube in place, tip below the diaphragm. 2. Mild cardiomegaly and chronic hyperinflation. Electronically Signed   By: Narda Rutherford M.D.   On: 11/28/2018 01:32   Dg Chest Portable 1 View  Result Date: 12/10/2018 CLINICAL DATA:  Acute shortness of breath EXAM: PORTABLE CHEST 1 VIEW COMPARISON:  03/02/2018 and prior chest radiographs FINDINGS:  Cardiomegaly again noted. Probable COPD noted. Mild chronic interstitial opacities in the LOWER lungs again noted. There is no evidence of focal airspace disease, pulmonary edema, suspicious pulmonary nodule/mass, pleural effusion, or pneumothorax. No acute bony abnormalities are identified. IMPRESSION: No evidence of acute cardiopulmonary disease. Cardiomegaly, COPD type changes and mild chronic LOWER lung interstitial opacities. Electronically Signed   By: Harmon Pier M.D.   On: 12/13/2018 13:32   ASSESSMENT AND PLAN:  [name]  is a 68 y.o. female with a known history of COPD on oxygen presents with a fall.  She was in respiratory distress and was placed on BiPAP for elevated carbon dioxide on ABG.   1.  Acute on chronic hypercarbic respiratory failure.  PCO2 of 98.   -was emergently intubated and on the ventilator. She failed BiPAP. -Continue IV Solu-Medrol and inhalers and nebulizer  2.  COPD exacerbation plus or minus pneumonia. -  Empiric antibiotics with Rocephin and Zithromax.  - Solu-Medrol and nebulizer treatments.  3.  Acute encephalopathy likely secondary to high carbon dioxide.  Hold gabapentin and Requip.  Give smaller dose of Klonopin.  4.  Depression.  Continue Zoloft.  Decrease dose of Klonopin.  5.  Type 2 diabetes mellitus.  Put on sliding scale.  6.  Hyperlipidemia on Pravachol  Critically ill. Long-term poor prognosis. No family in the ICU  CODE STATUS: full  DVT Prophylaxis: Lovenox  TOTAL TIME TAKING CARE OF THIS PATIENT: *30** minutes.  >50% time spent on counselling and coordination of care  POSSIBLE D/C IN **??* DAYS, DEPENDING ON CLINICAL CONDITION.  Note: This dictation was prepared with Dragon dictation along with smaller phrase technology. Any transcriptional errors that result from this process are unintentional.  Enedina Finner M.D on 11/28/2018 at 1:49 PM  Between 7am to 6pm - Pager - [phone]  After 6pm go to [email or web address] - Air traffic controller  [facility]  Office  [phone]  CC: Primary care physician; [name], DOPatient ID: [name], female   DOB: 1950-12-01, 68 y.o.   MRN: [id number]

## 2018-11-28 NOTE — Care Management Note (Signed)
Case Management Note  Patient Details  Name: [name] MRN: [id number] Date of Birth: 1951/07/06  Subjective/Objective:   Patient admitted for severe respiratory failure.  Patient failed on Bipap and required emergent intubation.  Patient is currently intubated and sedated.  Patient has a history of COPD and current smoking.  Patient is from home and has chronic O2 at 3L.  PCP verified as Dr. Olevia Perches.   Robbie Lis RN BSN Care Mangement [phone]                 Action/Plan:   Expected Discharge Date:                  Expected Discharge Plan:     In-House Referral:     Discharge planning Services     Post Acute Care Choice:    Choice offered to:     DME Arranged:    DME Agency:     HH Arranged:    HH Agency:     Status of Service:     If discussed at Long Length of Stay Meetings, dates discussed:    Additional Comments:  Lindsey GUTTERMAN, RN 11/28/2018, 9:23 AM

## 2018-11-28 NOTE — Progress Notes (Signed)
Pharmacy Antibiotic Note  [name] is a 68 y.o. female admitted on 12/10/18 with COPD exacerbation.  Pharmacy has been consulted for levaquin dosing.  Plan: Will start levaquin 500 mg IV daily  EKG QTc: 459 WNL  Height: 5\' 2"  (157.5 cm) Weight: 216 lb 4.3 oz (98.1 kg) IBW/kg (Calculated) : 50.1  Temp (24hrs), Avg:98 F (36.7 C), Min:96.9 F (36.1 C), Max:99.1 F (37.3 C)  Recent Labs  Lab 12-10-18 1256 Dec 10, 2018 2058 11/28/18 0459  WBC 22.8*  --  18.1*  CREATININE 0.75 0.91 1.05*    Estimated Creatinine Clearance: 57.7 mL/min (A) (by C-G formula based on SCr of 1.05 mg/dL (H)).    Allergies  Allergen Reactions  . Percocet [Oxycodone-Acetaminophen] Nausea And Vomiting  . Chantix [Varenicline] Other (See Comments)    Severe headache and vomiting  . Fluoxetine Other (See Comments)    Headaches  . Lisinopril Cough  . Other     Other reaction(s): Unknown Contrast  . Verapamil Other (See Comments)    Lazy    Thank you for allowing pharmacy to be a part of this patient's care.  Thomasene Ripple, PharmD, BCPS Clinical Pharmacist 11/28/2018

## 2018-11-28 NOTE — Progress Notes (Signed)
Initial Nutrition Assessment  DOCUMENTATION CODES:   Obesity unspecified  INTERVENTION:   If tube feeds initiated, recommend:  Vital HP @ goal rate of 80ml/hr  Free water flushes 20ml q4 hours to maintain tube patency   Regimen provides 1200kcal/day, 105g/day protein, 1155ml/day free water  Liquid MVI daily via tube   NUTRITION DIAGNOSIS:   Inadequate oral intake related to inability to eat as evidenced by NPO status  GOAL:   Provide needs based on ASPEN/SCCM guidelines  MONITOR:   Vent status, Labs, Weight trends, Skin, I & O's  REASON FOR ASSESSMENT:   Ventilator    ASSESSMENT:   68 yo female admitted with acute on chronic respiratory failure secondary to AECOPD and viral bronchitis requiring Bipap-failed biPAP, emergently intubated   Pt sedated and ventilated. No family at bedside. OGT in place. RSV panel negative. Per chart, pt appears weight stable pta. No plans for TFs today; possible wean trial.   Medications reviewed and include: aspirin, lovenox, insulin, solu-medrol, protonix, azithromycin, precedex, fentanyl, levophed, KPhos  Labs reviewed: K 3.8 wnl, Cl 92(L), BUN 27(H), creat 1.05(H), P 1.4(L), Mg 2.1 wnl Wbc- 18.1(H), Hgb 9.9(L), Hct 32.8(L)  Patient is currently intubated on ventilator support MV: 7.1 L/min Temp (24hrs), Avg:98 F (36.7 C), Min:96.9 F (36.1 C), Max:99.1 F (37.3 C)  Propofol: none  MAP- >27mmHg  UOP- x 24 hrs  NUTRITION - FOCUSED PHYSICAL EXAM:    Most Recent Value  Orbital Region  No depletion  Upper Arm Region  No depletion  Thoracic and Lumbar Region  No depletion  Buccal Region  No depletion  Temple Region  No depletion  Clavicle Bone Region  No depletion  Clavicle and Acromion Bone Region  No depletion  Scapular Bone Region  No depletion  Dorsal Hand  No depletion  Patellar Region  No depletion  Anterior Thigh Region  No depletion  Posterior Calf Region  No depletion  Edema (RD Assessment)  Mild   Hair  Reviewed  Eyes  Reviewed  Mouth  Reviewed  Skin  Reviewed  Nails  Reviewed     Diet Order:   Diet Order            Diet NPO time specified  Diet effective now             EDUCATION NEEDS:   No education needs have been identified at this time  Skin:  Skin Assessment: Reviewed RN Assessment(ecchymosis )  Last BM:  PTA  Height:   Ht Readings from Last 1 Encounters:  12/16/2018 5\' 2"  (1.575 m)    Weight:   Wt Readings from Last 1 Encounters:  12/21/2018 98.1 kg    Ideal Body Weight:  50 kg  BMI:  Body mass index is 39.56 kg/m.  Estimated Nutritional Needs:   Kcal:  1079-1373kcal/day   Protein:  >100g/day   Fluid:  1.5L/day   Betsey Holiday MS, RD, LDN Pager #- [phone] Office#- [phone] After Hours Pager: [phone]

## 2018-11-28 NOTE — Progress Notes (Signed)
Inpatient Diabetes Program Recommendations  AACE/ADA: New Consensus Statement on Inpatient Glycemic Control  Target Ranges:  Prepandial:   less than 140 mg/dL      Peak postprandial:   less than 180 mg/dL (1-2 hours)      Critically ill patients:  140 - 180 mg/dL   Results for Woon, [name] (MRN [id number]) as of 11/28/2018 10:20  Ref. Range 12/08/18 15:42 2018-12-08 17:39 December 08, 2018 21:30 11/28/2018 07:56  Glucose-Capillary Latest Ref Range: 70 - 99 mg/dL 334 (H) 356 (H) 861 (H) 200 (H)  Results for Kohl, [name] (MRN [id number]) as of 11/28/2018 10:20  Ref. Range 12/08/2018 16:06  Hemoglobin A1C Latest Ref Range: 4.8 - 5.6 % 6.6 (H)   Review of Glycemic Control  Diabetes history: DM2 (diet controlled) Outpatient Diabetes medications: None Current orders for Inpatient glycemic control: Novolog 0-9 units TID with meals, Novolog 0-5 units QHS; Solumedrol 40 mg Q12H  Inpatient Diabetes Program Recommendations:  Correction (SSI): Noted patient is NPO and on ventilator. Please consider changing frequency of CBGs to Q4H and Novolog 0-9 units Q4H. HgbA1C: A1C 6.6% on 12/08/2018 indicating an average glucose of 143 mg/dl over the past 2-3 months.  Thanks, Orlando Penner, RN, MSN, CDE Diabetes Coordinator Inpatient Diabetes Program [phone] (Team Pager from 8am to 5pm)

## 2018-11-28 NOTE — Progress Notes (Signed)
CRITICAL CARE NOTE  CC  severe respiratory failure  SUBJECTIVE Failed biPAP Increased WOB and using accessory muscles to breathe Patient delirious +critically ill  Patient emergently intubated,sedated Now on full vent support     SIGNIFICANT EVENTS 3.2 intubation  REVIEW OF SYSTEMS  PATIENT IS UNABLE TO PROVIDE COMPLETE REVIEW OF SYSTEMS DUE TO SEVERE CRITICAL ILLNESS   PHYSICAL EXAMINATION:  GENERAL:critically ill appearing, +resp distress HEAD: Normocephalic, atraumatic.  EYES: Pupils equal, round, reactive to light.  No scleral icterus.  MOUTH: Moist mucosal membrane. NECK: Supple. No thyromegaly. No nodules. No JVD.  PULMONARY: +rhonchi, +wheezing CARDIOVASCULAR: S1 and S2. Regular rate and rhythm. No murmurs, rubs, or gallops.  GASTROINTESTINAL: Soft, nontender, -distended. No masses. Positive bowel sounds. No hepatosplenomegaly.  MUSCULOSKELETAL: No swelling, clubbing, or edema.  NEUROLOGIC: obtunded, GCS<8 SKIN:intact,warm,dry    CULTURE RESULTS   Recent Results (from the past 240 hour(s))  MRSA PCR Screening     Status: None   Collection Time: Dec 14, 2018  5:21 PM  Result Value Ref Range Status   MRSA by PCR NEGATIVE NEGATIVE Final    Comment:        The GeneXpert MRSA Assay (FDA approved for NASAL specimens only), is one component of a comprehensive MRSA colonization surveillance program. It is not intended to diagnose MRSA infection nor to guide or monitor treatment for MRSA infections. Performed at [facility], [address] [place], [place] [address]           IMAGING    Ct Head Wo Contrast  Result Date: December 14, 2018 CLINICAL DATA:  Status post fall, altered mental status EXAM: CT HEAD WITHOUT CONTRAST CT CERVICAL SPINE WITHOUT CONTRAST TECHNIQUE: Multidetector CT imaging of the head and cervical spine was performed following the standard protocol without intravenous contrast. Multiplanar CT image reconstructions of the cervical  spine were also generated. COMPARISON:  None. FINDINGS: CT HEAD FINDINGS Brain: No evidence of acute infarction, hemorrhage, hydrocephalus, extra-axial collection or mass lesion/mass effect. Vascular: No hyperdense vessel or unexpected calcification. Skull: No acute calvarial injury.  Bilateral parietal burr holes. Sinuses/Orbits: Osteoma in the left posterior ethmoid sinus. Remainder the paranasal sinuses are clear. Visualized mastoid sinuses are clear. Visualized orbits demonstrate no focal abnormality. Other: None CT CERVICAL SPINE FINDINGS Alignment: 1-2 mm anterolisthesis of C3 on C4. Skull base and vertebrae: No acute fracture. No primary bone lesion or focal pathologic process. Soft tissues and spinal canal: No prevertebral fluid or swelling. No visible canal hematoma. Disc levels: Degenerative disc disease with disc height loss at C4-5, C5-6 and C6-7. Moderate right facet arthropathy at C2-3. Bilateral uncovertebral degenerative changes at C5-6 with mild foraminal narrowing. Upper chest: Lung apices are clear. Other: No fluid collection or hematoma. IMPRESSION: 1. No acute intracranial pathology. 2.  No acute osseous injury of the cervical spine. Electronically Signed   By: Elige Ko   On: 14-Dec-2018 15:02   Ct Cervical Spine Wo Contrast  Result Date: 12-14-2018 CLINICAL DATA:  Status post fall, altered mental status EXAM: CT HEAD WITHOUT CONTRAST CT CERVICAL SPINE WITHOUT CONTRAST TECHNIQUE: Multidetector CT imaging of the head and cervical spine was performed following the standard protocol without intravenous contrast. Multiplanar CT image reconstructions of the cervical spine were also generated. COMPARISON:  None. FINDINGS: CT HEAD FINDINGS Brain: No evidence of acute infarction, hemorrhage, hydrocephalus, extra-axial collection or mass lesion/mass effect. Vascular: No hyperdense vessel or unexpected calcification. Skull: No acute calvarial injury.  Bilateral parietal burr holes. Sinuses/Orbits:  Osteoma in the left posterior  ethmoid sinus. Remainder the paranasal sinuses are clear. Visualized mastoid sinuses are clear. Visualized orbits demonstrate no focal abnormality. Other: None CT CERVICAL SPINE FINDINGS Alignment: 1-2 mm anterolisthesis of C3 on C4. Skull base and vertebrae: No acute fracture. No primary bone lesion or focal pathologic process. Soft tissues and spinal canal: No prevertebral fluid or swelling. No visible canal hematoma. Disc levels: Degenerative disc disease with disc height loss at C4-5, C5-6 and C6-7. Moderate right facet arthropathy at C2-3. Bilateral uncovertebral degenerative changes at C5-6 with mild foraminal narrowing. Upper chest: Lung apices are clear. Other: No fluid collection or hematoma. IMPRESSION: 1. No acute intracranial pathology. 2.  No acute osseous injury of the cervical spine. Electronically Signed   By: Elige Ko   On: 12/06/2018 15:02   Dg Chest Portable 1 View  Result Date: Dec 06, 2018 CLINICAL DATA:  Acute shortness of breath EXAM: PORTABLE CHEST 1 VIEW COMPARISON:  03/02/2018 and prior chest radiographs FINDINGS: Cardiomegaly again noted. Probable COPD noted. Mild chronic interstitial opacities in the LOWER lungs again noted. There is no evidence of focal airspace disease, pulmonary edema, suspicious pulmonary nodule/mass, pleural effusion, or pneumothorax. No acute bony abnormalities are identified. IMPRESSION: No evidence of acute cardiopulmonary disease. Cardiomegaly, COPD type changes and mild chronic LOWER lung interstitial opacities. Electronically Signed   By: Harmon Pier M.D.   On: 2018-12-06 13:32      Indwelling Urinary Catheter continued, requirement due to   Reason to continue Indwelling Urinary Catheter for strict Intake/Output monitoring for hemodynamic instability         Ventilator continued, requirement due to, resp failure    Ventilator Sedation RASS 0 to -2     ASSESSMENT AND PLAN SYNOPSIS 68 yo female admitted with  acute on chronic respiratory failure secondary to AECOPD and viral bronchitis requiring Bipap-failed biPAP, emergently intubated     Severe Hypoxic and Hypercapnic Respiratory Failure -continue Full MV support -continue Bronchodilator Therapy -Wean Fio2 and PEEP as tolerated   SEVERE COPD EXACERBATION -continue IV steroids as prescribed -continue NEB THERAPY as prescribed -morphine as needed -wean fio2 as needed and tolerated   NEUROLOGY - intubated and sedated - minimal sedation to achieve a RASS goal: -1 Wake up assessment pending   CARDIAC ICU monitoring  ID -continue IV abx as prescibed -follow up cultures  GI/Nutrition GI PROPHYLAXIS as indicated DIET-->OGT placed Constipation protocol as indicated  ENDO - ICU hypoglycemic\Hyperglycemia protocol -check FSBS per protocol   ELECTROLYTES -follow labs as needed -replace as needed -pharmacy consultation and following   DVT/GI PRX ordered TRANSFUSIONS AS NEEDED MONITOR FSBS ASSESS the need for LABS as needed   Critical Care Time devoted to patient care services described in this note is 35 minutes.   Overall, patient is critically ill, prognosis is guarded.  Patient with Multiorgan failure and at high risk for cardiac arrest and death.    Lucie Leather, M.D.  [facility]  Medical Director [facility] Medical Director [facility]-P[facility] Department

## 2018-11-29 ENCOUNTER — Inpatient Hospital Stay: Payer: Medicare Other

## 2018-11-29 LAB — BASIC METABOLIC PANEL
Anion gap: 11 (ref 5–15)
BUN: 49 mg/dL — ABNORMAL HIGH (ref 8–23)
CO2: 35 mmol/L — ABNORMAL HIGH (ref 22–32)
Calcium: 7.7 mg/dL — ABNORMAL LOW (ref 8.9–10.3)
Chloride: 92 mmol/L — ABNORMAL LOW (ref 98–111)
Creatinine, Ser: 1.8 mg/dL — ABNORMAL HIGH (ref 0.44–1.00)
GFR calc Af Amer: 33 mL/min — ABNORMAL LOW (ref >60)
GFR calc non Af Amer: 29 mL/min — ABNORMAL LOW (ref >60)
Glucose, Bld: 157 mg/dL — ABNORMAL HIGH (ref 70–99)
Potassium: 4.5 mmol/L (ref 3.5–5.1)
Sodium: 138 mmol/L (ref 135–145)

## 2018-11-29 LAB — GLUCOSE, CAPILLARY
Glucose-Capillary: 147 mg/dL — ABNORMAL HIGH (ref 70–99)
Glucose-Capillary: 159 mg/dL — ABNORMAL HIGH (ref 70–99)
Glucose-Capillary: 177 mg/dL — ABNORMAL HIGH (ref 70–99)
Glucose-Capillary: 181 mg/dL — ABNORMAL HIGH (ref 70–99)
Glucose-Capillary: 190 mg/dL — ABNORMAL HIGH (ref 70–99)
Glucose-Capillary: 203 mg/dL — ABNORMAL HIGH (ref 70–99)

## 2018-11-29 LAB — PROCALCITONIN: Procalcitonin: <0.1 ng/mL

## 2018-11-29 LAB — HIV ANTIBODY (ROUTINE TESTING W REFLEX): HIV Screen 4th Generation wRfx: NONREACTIVE

## 2018-11-29 LAB — ALBUMIN: Albumin: 3.2 g/dL — ABNORMAL LOW (ref 3.5–5.0)

## 2018-11-29 MED ORDER — METHYLPREDNISOLONE SODIUM SUCC 40 MG IJ SOLR
40.0000 mg | Freq: Four times a day (QID) | INTRAMUSCULAR | Status: DC
  Administered 2018-11-29 – 2018-11-30 (×3): 40 mg via INTRAVENOUS
  Filled 2018-11-29 (×3): qty 1

## 2018-11-29 MED ORDER — BUDESONIDE 0.25 MG/2ML IN SUSP
0.2500 mg | Freq: Two times a day (BID) | RESPIRATORY_TRACT | Status: DC
  Administered 2018-11-30: 0.25 mg via RESPIRATORY_TRACT
  Filled 2018-11-29: qty 2

## 2018-11-29 MED ORDER — FREE WATER
30.0000 mL | Status: DC
  Administered 2018-11-29 – 2018-12-03 (×24): 30 mL

## 2018-11-29 MED ORDER — SODIUM CHLORIDE 0.9 % IV BOLUS
1000.0000 mL | Freq: Once | INTRAVENOUS | Status: AC
  Administered 2018-11-29: 1000 mL via INTRAVENOUS

## 2018-11-29 MED ORDER — IPRATROPIUM-ALBUTEROL 0.5-2.5 (3) MG/3ML IN SOLN: 3.0000 mL | RESPIRATORY_TRACT | Status: DC | PRN

## 2018-11-29 MED ORDER — VITAL HIGH PROTEIN PO LIQD
1000.0000 mL | ORAL | Status: DC
  Administered 2018-11-29 – 2018-11-30 (×2): 1000 mL

## 2018-11-29 MED ORDER — BUDESONIDE 0.25 MG/2ML IN SUSP: 0.2500 mg | Freq: Two times a day (BID) | RESPIRATORY_TRACT | Status: DC

## 2018-11-29 MED ORDER — ADULT MULTIVITAMIN LIQUID CH
15.0000 mL | Freq: Every day | ORAL | Status: DC
  Administered 2018-11-29 – 2018-12-03 (×5): 15 mL
  Filled 2018-11-29 (×5): qty 15

## 2018-11-29 MED ORDER — ROPINIROLE HCL 0.25 MG PO TABS
0.5000 mg | ORAL_TABLET | Freq: Two times a day (BID) | ORAL | Status: DC
  Administered 2018-11-29 – 2018-12-03 (×9): 0.5 mg
  Filled 2018-11-29 (×10): qty 2

## 2018-11-29 MED ORDER — SODIUM CHLORIDE 0.9 % IV SOLN
INTRAVENOUS | Status: DC
  Administered 2018-11-29 – 2018-11-30 (×2): 80 mL/h via INTRAVENOUS
  Administered 2018-12-01 – 2018-12-03 (×4): via INTRAVENOUS

## 2018-11-29 NOTE — Progress Notes (Signed)
Follow up - Critical Care Medicine Note  Patient Details:    [name] is an 68 y.o. female. 68 yo female, current smoker, admitted with acute on chronic respiratory failure secondary to AECOPD and viral bronchitis requiring Bipap.  Failed BiPAP and required intubation/mechanical ventilation.   Previously DNR.  Lines, Airways, Drains: Airway 7.5 mm (Active)  Secured at (cm) 25 cm 11/29/2018  8:00 PM  Measured From Lips 11/29/2018  8:00 PM  Secured Location Right 11/29/2018  7:15 PM  Secured By [employer] Tube Holder 11/29/2018  7:15 PM  Tube Holder Repositioned Yes 11/29/2018  7:15 PM  Cuff Pressure (cm H2O) 26 cm H2O 11/29/2018  7:15 PM  Site Condition Dry 11/29/2018  7:15 PM     CVC Triple Lumen 11/28/18 Right Internal jugular (Active)  Indication for Insertion or Continuance of Line Vasoactive infusions 11/29/2018  8:00 PM  Site Assessment Bleeding;Other (Comment) 11/29/2018  8:00 AM  Proximal Lumen Status Infusing;Flushed;Blood return noted 11/29/2018  8:00 AM  Medial Lumen Status Infusing;Flushed;Blood return noted 11/29/2018  8:00 AM  Distal Lumen Status Infusing;Flushed;Blood return noted 11/29/2018  8:00 AM  Dressing Type Occlusive;Gauze;Pressure 11/29/2018  8:00 AM  Dressing Status New drainage;Old drainage;Intact 11/29/2018  8:00 AM  Line Care Connections checked and tightened 11/29/2018  8:00 AM  Dressing Intervention Dressing reinforced 11/29/2018  8:00 AM  Dressing Change Due 12/05/18 11/29/2018  8:00 AM     NG/OG Tube Orogastric Center mouth Xray Documented cm marking at nare/ corner of mouth 55 cm (Active)  Cm Marking at Nare/Corner of Mouth (if applicable) 55 cm 11/29/2018  4:00 PM  External Length of Tube (cm) - (if applicable) 55 cm 11/28/2018  7:20 PM  Site Assessment Clean;Dry;Intact 11/29/2018  4:00 PM  Ongoing Placement Verification No change in cm markings or external length of tube from initial placement;No change in respiratory status;No acute changes, not attributed to clinical condition  11/29/2018  4:00 PM  Status Infusing tube feed 11/29/2018  4:00 PM  Intake (mL) 100 mL 11/28/2018 12:00 PM  Output (mL) 55 mL 11/29/2018  5:00 AM     Urethral Catheter BLRC, RN Non-latex (Active)  Indication for Insertion or Continuance of Catheter Unstable critically ill patients first 24-48 hours (See Criteria) 11/29/2018  4:00 PM  Site Assessment Clean;Intact;Dry 11/29/2018  4:00 PM  Catheter Maintenance Bag below level of bladder;No dependent loops;Catheter secured;Drainage bag/tubing not touching floor;Seal intact;Insertion date on drainage bag 11/29/2018  8:00 PM  Collection Container Standard drainage bag 11/29/2018  4:00 PM  Securement Method Securing device (Describe) 11/29/2018  4:00 PM  Urinary Catheter Interventions Unclamped 11/29/2018  4:00 PM  Output (mL) 110 mL 11/29/2018  8:00 PM    Anti-infectives:  Anti-infectives (From admission, onward)   Start     Dose/Rate Route Frequency Ordered Stop   11/28/18 2200  levofloxacin (LEVAQUIN) IVPB 500 mg  Status:  Discontinued     500 mg 100 mL/hr over 60 Minutes Intravenous Every 24 hours 11/28/18 0728 11/28/18 1014   11/28/18 1800  azithromycin (ZITHROMAX) 500 mg in sodium chloride 0.9 % 250 mL IVPB  Status:  Discontinued     500 mg 250 mL/hr over 60 Minutes Intravenous Daily-1800 11/28/18 1014 11/28/18 1015   11/28/18 1030  azithromycin (ZITHROMAX) 500 mg in sodium chloride 0.9 % 250 mL IVPB     500 mg 250 mL/hr over 60 Minutes Intravenous Daily 11/28/18 1015     25-Sep-2019 1515  cefTRIAXone (ROCEPHIN) 2 g in sodium chloride 0.9 %  100 mL IVPB  Status:  Discontinued     2 g 200 mL/hr over 30 Minutes Intravenous Every 24 hours 12/02/2018 1508 12/19/2018 1801   12/21/2018 1515  azithromycin (ZITHROMAX) 500 mg in sodium chloride 0.9 % 250 mL IVPB  Status:  Discontinued     500 mg 250 mL/hr over 60 Minutes Intravenous Every 24 hours 12/07/2018 1508 11/28/18 0717   12/08/2018 1430  ceFEPIme (MAXIPIME) 1 g in sodium chloride 0.9 % 100 mL IVPB  Status:  Discontinued      1 g 200 mL/hr over 30 Minutes Intravenous  Once 12/15/2018 1424 12/14/2018 1508      Microbiology: Results for orders placed or performed during the hospital encounter of 12/27/2018  Urine culture     Status: None   Collection Time: 12/23/2018  2:57 PM  Result Value Ref Range Status   Specimen Description   Final    URINE, RANDOM Performed at [facility], [address], [place], [place] [address]    Special Requests   Final    NONE Performed at [facility], [address], [place], [place] [address]    Culture   Final    NO GROWTH Performed at [facility], [address], [place], [place] [address]    Report Status 11/28/2018 FINAL  Final  Culture, blood (routine x 2)     Status: None (Preliminary result)   Collection Time: 11/28/2018  3:01 PM  Result Value Ref Range Status   Specimen Description BLOOD RAC  Final   Special Requests   Final    BOTTLES DRAWN AEROBIC AND ANAEROBIC Blood Culture adequate volume   Culture   Final    NO GROWTH 2 DAYS Performed at [facility], [address], [place], [place] [address]    Report Status PENDING  Incomplete  Culture, blood (routine x 2)     Status: None (Preliminary result)   Collection Time: 12/06/2018  4:06 PM  Result Value Ref Range Status   Specimen Description BLOOD BLOOD RIGHT HAND  Final   Special Requests   Final    BOTTLES DRAWN AEROBIC AND ANAEROBIC Blood Culture results may not be optimal due to an excessive volume of blood received in culture bottles   Culture   Final    NO GROWTH 2 DAYS Performed at [facility], [address] [place], [place] [address]    Report Status PENDING  Incomplete  Respiratory Panel by PCR     Status: None   Collection Time: 12/06/2018  5:21 PM  Result Value Ref Range Status   Adenovirus NOT DETECTED NOT DETECTED Final   Coronavirus 229E NOT DETECTED NOT DETECTED Final    Comment: (NOTE) The Coronavirus on the Respiratory Panel, DOES NOT test  for the novel  Coronavirus (2019 nCoV)    Coronavirus HKU1 NOT DETECTED NOT DETECTED Final   Coronavirus NL63 NOT DETECTED NOT DETECTED Final   Coronavirus OC43 NOT DETECTED NOT DETECTED Final   Metapneumovirus NOT DETECTED NOT DETECTED Final   Rhinovirus / Enterovirus NOT DETECTED NOT DETECTED Final   Influenza A NOT DETECTED NOT DETECTED Final   Influenza B NOT DETECTED NOT DETECTED Final   Parainfluenza Virus 1 NOT DETECTED NOT DETECTED Final   Parainfluenza Virus 2 NOT DETECTED NOT DETECTED Final   Parainfluenza Virus 3 NOT DETECTED NOT DETECTED Final   Parainfluenza Virus 4 NOT DETECTED NOT DETECTED Final   Respiratory Syncytial Virus NOT DETECTED NOT DETECTED Final  Bordetella pertussis NOT DETECTED NOT DETECTED Final   Chlamydophila pneumoniae NOT DETECTED NOT DETECTED Final   Mycoplasma pneumoniae NOT DETECTED NOT DETECTED Final  MRSA PCR Screening     Status: None   Collection Time: 11/30/2018  5:21 PM  Result Value Ref Range Status   MRSA by PCR NEGATIVE NEGATIVE Final    Comment:        The GeneXpert MRSA Assay (FDA approved for NASAL specimens only), is one component of a comprehensive MRSA colonization surveillance program. It is not intended to diagnose MRSA infection nor to guide or monitor treatment for MRSA infections. Performed at [facility], [address] [place], [place] [address]   Culture, respiratory (non-expectorated)     Status: None (Preliminary result)   Collection Time: 11/28/18 12:00 PM  Result Value Ref Range Status   Specimen Description   Final    TRACHEAL ASPIRATE Performed at [facility], [address] [place], [place] [address]    Special Requests   Final    NONE Performed at [facility], [address] [place], [place] [address]    Gram Stain   Final    ABUNDANT WBC PRESENT, PREDOMINANTLY MONONUCLEAR ABUNDANT GRAM POSITIVE COCCI    Culture   Final    CULTURE REINCUBATED FOR BETTER  GROWTH Performed at [facility], [address] [place], [place] [address]    Report Status PENDING  Incomplete    Best Practice/Protocols:  VTE Prophylaxis: Lovenox (prophylaxtic dose) GI Prophylaxis: Proton Pump Inhibitor Continous Sedation  Events: Intubated, mechanically ventilated 3/2  Studies: Dg Abd 1 View  Result Date: 11/28/2018 CLINICAL DATA:  OG tube placement. EXAM: ABDOMEN - 1 VIEW COMPARISON:  None. FINDINGS: Tip of the enteric tube below the diaphragm in the stomach, the side port is not well visualized but likely just beyond the gastroesophageal junction. No evidence of free air in the upper abdomen. Cholecystectomy clips noted. IMPRESSION: Tip of the enteric tube below the diaphragm in the stomach. The side-port is not well visualized but likely just beyond the gastroesophageal junction. Electronically Signed   By: Narda Rutherford M.D.   On: 11/28/2018 01:34   Ct Head Wo Contrast  Result Date: 12/13/2018 CLINICAL DATA:  Status post fall, altered mental status EXAM: CT HEAD WITHOUT CONTRAST CT CERVICAL SPINE WITHOUT CONTRAST TECHNIQUE: Multidetector CT imaging of the head and cervical spine was performed following the standard protocol without intravenous contrast. Multiplanar CT image reconstructions of the cervical spine were also generated. COMPARISON:  None. FINDINGS: CT HEAD FINDINGS Brain: No evidence of acute infarction, hemorrhage, hydrocephalus, extra-axial collection or mass lesion/mass effect. Vascular: No hyperdense vessel or unexpected calcification. Skull: No acute calvarial injury.  Bilateral parietal burr holes. Sinuses/Orbits: Osteoma in the left posterior ethmoid sinus. Remainder the paranasal sinuses are clear. Visualized mastoid sinuses are clear. Visualized orbits demonstrate no focal abnormality. Other: None CT CERVICAL SPINE FINDINGS Alignment: 1-2 mm anterolisthesis of C3 on C4. Skull base and vertebrae: No acute fracture. No primary bone lesion or  focal pathologic process. Soft tissues and spinal canal: No prevertebral fluid or swelling. No visible canal hematoma. Disc levels: Degenerative disc disease with disc height loss at C4-5, C5-6 and C6-7. Moderate right facet arthropathy at C2-3. Bilateral uncovertebral degenerative changes at C5-6 with mild foraminal narrowing. Upper chest: Lung apices are clear. Other: No fluid collection or hematoma. IMPRESSION: 1. No acute intracranial pathology. 2.  No acute osseous injury of the cervical spine. Electronically Signed  By: Elige Ko   On: Dec 24, 2018 15:02   Ct Cervical Spine Wo Contrast  Result Date: 12-24-18 CLINICAL DATA:  Status post fall, altered mental status EXAM: CT HEAD WITHOUT CONTRAST CT CERVICAL SPINE WITHOUT CONTRAST TECHNIQUE: Multidetector CT imaging of the head and cervical spine was performed following the standard protocol without intravenous contrast. Multiplanar CT image reconstructions of the cervical spine were also generated. COMPARISON:  None. FINDINGS: CT HEAD FINDINGS Brain: No evidence of acute infarction, hemorrhage, hydrocephalus, extra-axial collection or mass lesion/mass effect. Vascular: No hyperdense vessel or unexpected calcification. Skull: No acute calvarial injury.  Bilateral parietal burr holes. Sinuses/Orbits: Osteoma in the left posterior ethmoid sinus. Remainder the paranasal sinuses are clear. Visualized mastoid sinuses are clear. Visualized orbits demonstrate no focal abnormality. Other: None CT CERVICAL SPINE FINDINGS Alignment: 1-2 mm anterolisthesis of C3 on C4. Skull base and vertebrae: No acute fracture. No primary bone lesion or focal pathologic process. Soft tissues and spinal canal: No prevertebral fluid or swelling. No visible canal hematoma. Disc levels: Degenerative disc disease with disc height loss at C4-5, C5-6 and C6-7. Moderate right facet arthropathy at C2-3. Bilateral uncovertebral degenerative changes at C5-6 with mild foraminal narrowing.  Upper chest: Lung apices are clear. Other: No fluid collection or hematoma. IMPRESSION: 1. No acute intracranial pathology. 2.  No acute osseous injury of the cervical spine. Electronically Signed   By: Elige Ko   On: Dec 24, 2018 15:02   Portable Chest Xray  Result Date: 11/29/2018 CLINICAL DATA:  Intubation EXAM: PORTABLE CHEST 1 VIEW COMPARISON:  Yesterday FINDINGS: Endotracheal tube tip between the clavicular heads and carina. The orogastric tube at least reaches the stomach. Right IJ line with tip at the SVC. Large lung volumes with interstitial coarsening at the bases. Normal heart size and mediastinal contours. No effusion or pneumothorax. IMPRESSION: 1. Stable and unremarkable hardware positioning. 2. COPD with stable aeration. Electronically Signed   By: Marnee Spring M.D.   On: 11/29/2018 06:18   Dg Chest Port 1 View  Result Date: 11/28/2018 CLINICAL DATA:  Line placement EXAM: PORTABLE CHEST 1 VIEW COMPARISON:  11/28/2018, 12-30-202012 FINDINGS: Endotracheal tube tip is about 2 cm superior to the carina. Right-sided central venous catheter tip over the SVC. No pneumothorax. Hyperinflation with mild bronchitic changes at the bases. Emphysematous disease. Stable slightly enlarged cardiomediastinal silhouette. IMPRESSION: 1. Endotracheal tube tip about 2 cm superior to carina. 2. Right-sided central venous catheter tip over the SVC. No pneumothorax 3. Hyperinflation.  Stable interstitial opacities at the bases. Electronically Signed   By: Jasmine Pang M.D.   On: 11/28/2018 02:38   Portable Chest X-ray  Result Date: 11/28/2018 CLINICAL DATA:  Endotracheal tube placement. EXAM: PORTABLE CHEST 1 VIEW COMPARISON:  Radiographs yesterday. FINDINGS: Endotracheal tube tip 3.6 cm from the carina. Enteric tube in place, tip below the diaphragm, better assessed on concurrent abdominal radiographs. Mild cardiomegaly. Hyperinflation with chronic interstitial coarsening. No acute airspace disease, pneumothorax  or large pleural effusion. IMPRESSION: 1. Endotracheal tube tip 3.6 cm from the carina. Enteric tube in place, tip below the diaphragm. 2. Mild cardiomegaly and chronic hyperinflation. Electronically Signed   By: Narda Rutherford M.D.   On: 11/28/2018 01:32   Dg Chest Portable 1 View  Result Date: 24-Dec-2018 CLINICAL DATA:  Acute shortness of breath EXAM: PORTABLE CHEST 1 VIEW COMPARISON:  03/02/2018 and prior chest radiographs FINDINGS: Cardiomegaly again noted. Probable COPD noted. Mild chronic interstitial opacities in the LOWER lungs again noted. There is no evidence of  focal airspace disease, pulmonary edema, suspicious pulmonary nodule/mass, pleural effusion, or pneumothorax. No acute bony abnormalities are identified. IMPRESSION: No evidence of acute cardiopulmonary disease. Cardiomegaly, COPD type changes and mild chronic LOWER lung interstitial opacities. Electronically Signed   By: Harmon Pier M.D.   On: 12/02/2018 13:32    Consults: Treatment Team:  Erin Fulling, MD   Subjective:    Overnight Issues: Still with difficulty with ventilator synchronization.  FiO2 requirements are not excessive however, ventilator synchrony is the main issue.  Objective:  Vital signs for last 24 hours: Temp:  [99.1 F (37.3 C)-100.2 F (37.9 C)] 99.5 F (37.5 C) (03/03 2000) Pulse Rate:  [66-84] 70 (03/03 2000) Resp:  [13-24] 17 (03/03 2000) BP: (79-145)/(45-125) 116/73 (03/03 2000) SpO2:  [90 %-100 %] 98 % (03/03 2000) FiO2 (%):  [35 %] 35 % (03/03 2000)  Hemodynamic parameters for last 24 hours:    Intake/Output from previous day: 03/02 0701 - 03/03 0700 In: 2077.1 [I.V.:1208.1; NG/GT:100; IV Piggyback:769] Out: 398 [Urine:323; Emesis/NG output:75]  Intake/Output this shift: Total I/O In: 322.4 [I.V.:322.4] Out: 110 [Urine:110]  Vent settings for last 24 hours: Vent Mode: PRVC FiO2 (%):  [35 %] 35 % Set Rate:  [18 bmp] 18 bmp Vt Set:  [400 mL] 400 mL PEEP:  [8 cmH20] 8  cmH20 Plateau Pressure:  [19 cmH20-32 cmH20] 22 cmH20  Physical Exam:  General: well developed, obese female, intubated/sedated/mechanically ventilated.  Ventilator asynchrony noted. Neuro:  Sedated.  HEENT: PERRL. No icterus Cardiovascular: nsr, rrr, no R/G Lungs: diminished throughout, coarse, no wheezes Abdomen: +BS x4, soft, obese, non distended Musculoskeletal: no edema, no clubbing  Skin: intact no lesions present   Assessment/Plan:  1.  Acute on chronic mixed respiratory failure with ventilator dependence due to acute exacerbation of COPD triggered by viral illness.  Continue ventilator support.  She had severe underlying disease prior to this event.  She has chronic hypoxic respiratory failure requiring 3 L of oxygen at baseline at home.  In addition it has surfaced that she has continued to smoke.  We will continue to support and reassess on a daily basis.  Her prognosis prior to this illness was guarded.  2.  COPD with chronic hypoxemic respiratory failure: She has acute exacerbation, continue steroids, bronchodilator treatments and Azithromycin.  3.  Acute on chronic kidney injury: Continue IV fluid repletion..  4.  Type 2 diabetes mellitus: ICU hyperglycemia protocol.  Currently on sliding scale insulin.  5.  Depression: Continue Zoloft.  6.  Nutrition: Start tube feeds.    LOS: 2 days   Additional comments: Multidisciplinary rounds were performed with the ICU team.  Discussion with family to include sister, and niece and nephew, patient's brother was done today.  Patient's sister has DNR documentation for the patient.  They would like the patient to be DNR but continue to try to wean off the ventilator if possible.  They would like to meet with palliative care and a consult has been placed.  Critical Care Total Time*: 40 Minutes  C. Foye Spurling [facility] PCCM 11/29/2018  *Care during the described time interval was provided by me and/or other providers on the  critical care team.  I have reviewed this patient's available data, including medical history, events of note, physical examination and test results as part of my evaluation.

## 2018-11-29 NOTE — Progress Notes (Signed)
[facility] -  at [facility]   PATIENT NAME: [name]    MR#:  [id number]  DATE OF BIRTH:  Jun 07, 1951  SUBJECTIVE:   Patient came in with increasing shortness of breath and cough. Veins intubated  currently  IV precedex and fentanyl. On IV levophed REVIEW OF SYSTEMS:   Review of Systems  Unable to perform ROS: Intubated   Tolerating Diet:TF   DRUG ALLERGIES:   Allergies  Allergen Reactions  . Percocet [Oxycodone-Acetaminophen] Nausea And Vomiting  . Chantix [Varenicline] Other (See Comments)    Severe headache and vomiting  . Fluoxetine Other (See Comments)    Headaches  . Lisinopril Cough  . Other     Other reaction(s): Unknown Contrast  . Verapamil Other (See Comments)    Lazy    VITALS:  Blood pressure 121/71, pulse 71, temperature 99.1 F (37.3 C), temperature source Axillary, resp. rate 18, height 5\' 2"  (1.575 m), weight 98.1 kg, SpO2 99 %.  PHYSICAL EXAMINATION:   Physical Exam  GENERAL:  69 y.o.-year-old patient lying in the bed with no acute distress. Morbidly obese critically ill  EYES: Pupils equal, round, reactive to light and accommodation. No scleral icterus.  HEENT: Head atraumatic, normocephalic. Oropharynx and nasopharynx clear. Intubated on the ventilator NECK:  Supple, no jugular venous distention. No thyroid enlargement, no tenderness.  LUNGS: Normal breath sounds bilaterally, no wheezing, rales, rhonchi. No use of accessory muscles of respiration.  CARDIOVASCULAR: S1, S2 normal. No murmurs, rubs, or gallops. Mild tachycardia ABDOMEN: Soft, nontender, nondistended. Bowel sounds present. No organomegaly or mass.  EXTREMITIES: No cyanosis, clubbing or edema b/l.    NEUROLOGIC: on the ventilator  PSYCHIATRIC:  patient is sedated  SKIN: No obvious rash, lesion, or ulcer.   LABORATORY PANEL:  CBC Recent Labs  Lab 11/28/18 0459  WBC 18.1*  HGB 9.9*  HCT 32.8*  PLT 237    Chemistries  Recent Labs  Lab  11/30/2018 1256  11/28/18 0459 11/29/18 0535  NA 141   < > 140 138  K 3.3*   < > 3.8 4.5  CL 87*   < > 92* 92*  CO2 45*   < > 37* 35*  GLUCOSE 211*   < > 221* 157*  BUN 21   < > 27* 49*  CREATININE 0.75   < > 1.05* 1.80*  CALCIUM 9.0   < > 8.0* 7.7*  MG  --    < > 2.1  --   AST 17  --   --   --   ALT 15  --   --   --   ALKPHOS 78  --   --   --   BILITOT 0.6  --   --   --    < > = values in this interval not displayed.   Cardiac Enzymes Recent Labs  Lab 12/16/2018 1256  TROPONINI <0.03   RADIOLOGY:  Dg Abd 1 View  Result Date: 11/28/2018 CLINICAL DATA:  OG tube placement. EXAM: ABDOMEN - 1 VIEW COMPARISON:  None. FINDINGS: Tip of the enteric tube below the diaphragm in the stomach, the side port is not well visualized but likely just beyond the gastroesophageal junction. No evidence of free air in the upper abdomen. Cholecystectomy clips noted. IMPRESSION: Tip of the enteric tube below the diaphragm in the stomach. The side-port is not well visualized but likely just beyond the gastroesophageal junction. Electronically Signed   By: Narda Rutherford M.D.   On:  11/28/2018 01:34   Ct Head Wo Contrast  Result Date: 12/09/2018 CLINICAL DATA:  Status post fall, altered mental status EXAM: CT HEAD WITHOUT CONTRAST CT CERVICAL SPINE WITHOUT CONTRAST TECHNIQUE: Multidetector CT imaging of the head and cervical spine was performed following the standard protocol without intravenous contrast. Multiplanar CT image reconstructions of the cervical spine were also generated. COMPARISON:  None. FINDINGS: CT HEAD FINDINGS Brain: No evidence of acute infarction, hemorrhage, hydrocephalus, extra-axial collection or mass lesion/mass effect. Vascular: No hyperdense vessel or unexpected calcification. Skull: No acute calvarial injury.  Bilateral parietal burr holes. Sinuses/Orbits: Osteoma in the left posterior ethmoid sinus. Remainder the paranasal sinuses are clear. Visualized mastoid sinuses are clear.  Visualized orbits demonstrate no focal abnormality. Other: None CT CERVICAL SPINE FINDINGS Alignment: 1-2 mm anterolisthesis of C3 on C4. Skull base and vertebrae: No acute fracture. No primary bone lesion or focal pathologic process. Soft tissues and spinal canal: No prevertebral fluid or swelling. No visible canal hematoma. Disc levels: Degenerative disc disease with disc height loss at C4-5, C5-6 and C6-7. Moderate right facet arthropathy at C2-3. Bilateral uncovertebral degenerative changes at C5-6 with mild foraminal narrowing. Upper chest: Lung apices are clear. Other: No fluid collection or hematoma. IMPRESSION: 1. No acute intracranial pathology. 2.  No acute osseous injury of the cervical spine. Electronically Signed   By: Elige Ko   On: 11/29/2018 15:02   Ct Cervical Spine Wo Contrast  Result Date: 12/19/2018 CLINICAL DATA:  Status post fall, altered mental status EXAM: CT HEAD WITHOUT CONTRAST CT CERVICAL SPINE WITHOUT CONTRAST TECHNIQUE: Multidetector CT imaging of the head and cervical spine was performed following the standard protocol without intravenous contrast. Multiplanar CT image reconstructions of the cervical spine were also generated. COMPARISON:  None. FINDINGS: CT HEAD FINDINGS Brain: No evidence of acute infarction, hemorrhage, hydrocephalus, extra-axial collection or mass lesion/mass effect. Vascular: No hyperdense vessel or unexpected calcification. Skull: No acute calvarial injury.  Bilateral parietal burr holes. Sinuses/Orbits: Osteoma in the left posterior ethmoid sinus. Remainder the paranasal sinuses are clear. Visualized mastoid sinuses are clear. Visualized orbits demonstrate no focal abnormality. Other: None CT CERVICAL SPINE FINDINGS Alignment: 1-2 mm anterolisthesis of C3 on C4. Skull base and vertebrae: No acute fracture. No primary bone lesion or focal pathologic process. Soft tissues and spinal canal: No prevertebral fluid or swelling. No visible canal hematoma. Disc  levels: Degenerative disc disease with disc height loss at C4-5, C5-6 and C6-7. Moderate right facet arthropathy at C2-3. Bilateral uncovertebral degenerative changes at C5-6 with mild foraminal narrowing. Upper chest: Lung apices are clear. Other: No fluid collection or hematoma. IMPRESSION: 1. No acute intracranial pathology. 2.  No acute osseous injury of the cervical spine. Electronically Signed   By: Elige Ko   On: 12/24/2018 15:02   Portable Chest Xray  Result Date: 11/29/2018 CLINICAL DATA:  Intubation EXAM: PORTABLE CHEST 1 VIEW COMPARISON:  Yesterday FINDINGS: Endotracheal tube tip between the clavicular heads and carina. The orogastric tube at least reaches the stomach. Right IJ line with tip at the SVC. Large lung volumes with interstitial coarsening at the bases. Normal heart size and mediastinal contours. No effusion or pneumothorax. IMPRESSION: 1. Stable and unremarkable hardware positioning. 2. COPD with stable aeration. Electronically Signed   By: Marnee Spring M.D.   On: 11/29/2018 06:18   Dg Chest Port 1 View  Result Date: 11/28/2018 CLINICAL DATA:  Line placement EXAM: PORTABLE CHEST 1 VIEW COMPARISON:  11/28/2018, 11/27/2010 FINDINGS: Endotracheal tube tip is about  2 cm superior to the carina. Right-sided central venous catheter tip over the SVC. No pneumothorax. Hyperinflation with mild bronchitic changes at the bases. Emphysematous disease. Stable slightly enlarged cardiomediastinal silhouette. IMPRESSION: 1. Endotracheal tube tip about 2 cm superior to carina. 2. Right-sided central venous catheter tip over the SVC. No pneumothorax 3. Hyperinflation.  Stable interstitial opacities at the bases. Electronically Signed   By: Jasmine Pang M.D.   On: 11/28/2018 02:38   Portable Chest X-ray  Result Date: 11/28/2018 CLINICAL DATA:  Endotracheal tube placement. EXAM: PORTABLE CHEST 1 VIEW COMPARISON:  Radiographs yesterday. FINDINGS: Endotracheal tube tip 3.6 cm from the carina.  Enteric tube in place, tip below the diaphragm, better assessed on concurrent abdominal radiographs. Mild cardiomegaly. Hyperinflation with chronic interstitial coarsening. No acute airspace disease, pneumothorax or large pleural effusion. IMPRESSION: 1. Endotracheal tube tip 3.6 cm from the carina. Enteric tube in place, tip below the diaphragm. 2. Mild cardiomegaly and chronic hyperinflation. Electronically Signed   By: Narda Rutherford M.D.   On: 11/28/2018 01:32   ASSESSMENT AND PLAN:  [name]  is a 68 y.o. female with a known history of COPD on oxygen presents with a fall.  She was in respiratory distress and was placed on BiPAP for elevated carbon dioxide on ABG.   1.  Acute on chronic hypercarbic respiratory failure.  PCO2 of 98.   -was emergently intubated and on the ventilator. She failed BiPAP. -Continue IV Solu-Medrol and inhalers and nebulizer  2.  COPD exacerbation plus or minus pneumonia. -  Empiric antibiotics with\Zithromax.  - Solu-Medrol and nebulizer treatments.  3.  Acute encephalopathy likely secondary to high carbon dioxide.  - Hold gabapentin and Requip.    4.  Depression.  Continue Zoloft.  Decrease dose of Klonopin.  5.  Type 2 diabetes mellitus.  Put on sliding scale.  6.  Hyperlipidemia on Pravachol  Critically ill. Long-term poor prognosis.  CODE STATUS: full  DVT Prophylaxis: Lovenox  TOTAL TIME TAKING CARE OF THIS PATIENT: *30** minutes.  >50% time spent on counselling and coordination of care  POSSIBLE D/C IN **??* DAYS, DEPENDING ON CLINICAL CONDITION.  Note: This dictation was prepared with Dragon dictation along with smaller phrase technology. Any transcriptional errors that result from this process are unintentional.  Enedina Finner M.D on 11/29/2018 at 2:39 PM  Between 7am to 6pm - Pager - [phone]  After 6pm go to [email or web address] - Social research officer, government  [facility]  Office  [phone]  CC: Primary care physician; [name], DOPatient ID: [name], female   DOB: July 27, 1951, 68 y.o.   MRN: [id number]

## 2018-11-29 NOTE — Progress Notes (Signed)
Pastoral Care visit    11/29/18 1300  Clinical Encounter Type  Visited With Patient and family together  Visit Type Initial;Critical Care;Psychological support;Spiritual support  Referral From Family  Consult/Referral To Chaplain  Recommendations follow up  Spiritual Encounters  Spiritual Needs Prayer     11/29/18 1300  Clinical Encounter Type  Visited With Patient and family together  Visit Type Initial;Critical Care;Psychological support;Spiritual support  Referral From Family  Consult/Referral To Chaplain  Recommendations follow up  Spiritual Encounters  Spiritual Needs Prayer   Pt family was in hall and warmly welcomed chaplain asking for me to come in to visit and pray with pt.  Noted that pt is a [place]. Fam (sister, niece, brother, and ?nephew) were present and supportive.  Per family, pt fell in bathroom and was not found for several days.  They are hopeful and optimistic for pt recovery.  Mirna Mires prayed w/ family over pt and fam asked chap to please come by to visit pt and pray for her regularly.  Milinda Antis, [address]

## 2018-11-30 ENCOUNTER — Inpatient Hospital Stay: Payer: Medicare Other

## 2018-11-30 DIAGNOSIS — Z7189 Other specified counseling: Secondary | ICD-10-CM

## 2018-11-30 DIAGNOSIS — Z515 Encounter for palliative care: Secondary | ICD-10-CM

## 2018-11-30 DIAGNOSIS — J9602 Acute respiratory failure with hypercapnia: Secondary | ICD-10-CM

## 2018-11-30 DIAGNOSIS — R451 Restlessness and agitation: Secondary | ICD-10-CM

## 2018-11-30 DIAGNOSIS — Z978 Presence of other specified devices: Secondary | ICD-10-CM

## 2018-11-30 DIAGNOSIS — J441 Chronic obstructive pulmonary disease with (acute) exacerbation: Secondary | ICD-10-CM

## 2018-11-30 LAB — GLUCOSE, CAPILLARY
Glucose-Capillary: 172 mg/dL — ABNORMAL HIGH (ref 70–99)
Glucose-Capillary: 190 mg/dL — ABNORMAL HIGH (ref 70–99)
Glucose-Capillary: 190 mg/dL — ABNORMAL HIGH (ref 70–99)
Glucose-Capillary: 194 mg/dL — ABNORMAL HIGH (ref 70–99)
Glucose-Capillary: 200 mg/dL — ABNORMAL HIGH (ref 70–99)
Glucose-Capillary: 232 mg/dL — ABNORMAL HIGH (ref 70–99)
Glucose-Capillary: 280 mg/dL — ABNORMAL HIGH (ref 70–99)

## 2018-11-30 LAB — CULTURE, RESPIRATORY W GRAM STAIN: Culture: NORMAL

## 2018-11-30 LAB — PHOSPHORUS: Phosphorus: 4.6 mg/dL (ref 2.5–4.6)

## 2018-11-30 LAB — CBC
HCT: 26.2 % — ABNORMAL LOW (ref 36.0–46.0)
Hemoglobin: 8 g/dL — ABNORMAL LOW (ref 12.0–15.0)
MCH: 30.1 pg (ref 26.0–34.0)
MCHC: 30.5 g/dL (ref 30.0–36.0)
MCV: 98.5 fL (ref 80.0–100.0)
Platelets: 176 10*3/uL (ref 150–400)
RBC: 2.66 MIL/uL — ABNORMAL LOW (ref 3.87–5.11)
RDW: 15.2 % (ref 11.5–15.5)
WBC: 15.1 10*3/uL — ABNORMAL HIGH (ref 4.0–10.5)
nRBC: 0 % (ref 0.0–0.2)

## 2018-11-30 LAB — BASIC METABOLIC PANEL
Anion gap: 8 (ref 5–15)
BUN: 59 mg/dL — ABNORMAL HIGH (ref 8–23)
CO2: 31 mmol/L (ref 22–32)
Calcium: 7.2 mg/dL — ABNORMAL LOW (ref 8.9–10.3)
Chloride: 100 mmol/L (ref 98–111)
Creatinine, Ser: 1.66 mg/dL — ABNORMAL HIGH (ref 0.44–1.00)
GFR calc Af Amer: 37 mL/min — ABNORMAL LOW (ref >60)
GFR calc non Af Amer: 32 mL/min — ABNORMAL LOW (ref >60)
Glucose, Bld: 219 mg/dL — ABNORMAL HIGH (ref 70–99)
Potassium: 4.4 mmol/L (ref 3.5–5.1)
Sodium: 139 mmol/L (ref 135–145)

## 2018-11-30 LAB — TRIGLYCERIDES: Triglycerides: 148 mg/dL (ref <150)

## 2018-11-30 LAB — MAGNESIUM: Magnesium: 2.3 mg/dL (ref 1.7–2.4)

## 2018-11-30 MED ORDER — METHYLPREDNISOLONE SODIUM SUCC 40 MG IJ SOLR
40.0000 mg | Freq: Two times a day (BID) | INTRAMUSCULAR | Status: DC
  Administered 2018-11-30 – 2018-12-02 (×4): 40 mg via INTRAVENOUS
  Filled 2018-11-30 (×4): qty 1

## 2018-11-30 MED ORDER — NOREPINEPHRINE BITARTRATE 1 MG/ML IV SOLN
0.0000 ug/min | INTRAVENOUS | Status: DC
  Filled 2018-11-30: qty 4

## 2018-11-30 MED ORDER — CLONAZEPAM 0.5 MG PO TABS
0.5000 mg | ORAL_TABLET | Freq: Two times a day (BID) | ORAL | Status: DC
  Administered 2018-11-30 – 2018-12-03 (×7): 0.5 mg
  Filled 2018-11-30 (×7): qty 1

## 2018-11-30 MED ORDER — NOREPINEPHRINE 4 MG/250ML-% IV SOLN
0.0000 ug/min | INTRAVENOUS | Status: DC
  Administered 2018-11-30: 4 ug/min via INTRAVENOUS
  Filled 2018-11-30: qty 250

## 2018-11-30 MED ORDER — PROPOFOL 1000 MG/100ML IV EMUL
5.0000 ug/kg/min | INTRAVENOUS | Status: DC
  Administered 2018-11-30: 33.979 ug/kg/min via INTRAVENOUS
  Administered 2018-11-30: 35 ug/kg/min via INTRAVENOUS
  Administered 2018-11-30: 67.958 ug/kg/min via INTRAVENOUS
  Administered 2018-11-30: 5 ug/kg/min via INTRAVENOUS
  Administered 2018-11-30: 35 ug/kg/min via INTRAVENOUS
  Administered 2018-11-30: 40 ug/kg/min via INTRAVENOUS
  Administered 2018-12-01: 39.076 ug/kg/min via INTRAVENOUS
  Administered 2018-12-01: 40 ug/kg/min via INTRAVENOUS
  Administered 2018-12-01: 59.463 ug/kg/min via INTRAVENOUS
  Administered 2018-12-01: 40 ug/kg/min via INTRAVENOUS
  Administered 2018-12-01: 67.958 ug/kg/min via INTRAVENOUS
  Administered 2018-12-02: 30 ug/kg/min via INTRAVENOUS
  Administered 2018-12-02: 14.951 ug/kg/min via INTRAVENOUS
  Administered 2018-12-02 – 2018-12-03 (×2): 20 ug/kg/min via INTRAVENOUS
  Filled 2018-11-30 (×16): qty 100

## 2018-11-30 MED ORDER — NOREPINEPHRINE 4 MG/250ML-% IV SOLN
0.0000 ug/min | INTRAVENOUS | Status: DC
  Administered 2018-11-30: 1 ug/min via INTRAVENOUS
  Administered 2018-11-30: 2 ug/min via INTRAVENOUS
  Filled 2018-11-30 (×2): qty 250

## 2018-11-30 MED ORDER — SENNOSIDES-DOCUSATE SODIUM 8.6-50 MG PO TABS
1.0000 | ORAL_TABLET | Freq: Two times a day (BID) | ORAL | Status: DC
  Administered 2018-11-30 – 2018-12-01 (×3): 1 via ORAL
  Filled 2018-11-30 (×3): qty 1

## 2018-11-30 MED ORDER — POLYETHYLENE GLYCOL 3350 17 G PO PACK
17.0000 g | PACK | Freq: Every day | ORAL | Status: DC
  Administered 2018-11-30 – 2018-12-03 (×4): 17 g
  Filled 2018-11-30 (×4): qty 1

## 2018-11-30 NOTE — Consult Note (Signed)
Consultation Note Date: 11/30/2018   Patient Name: [name]  DOB: 23-Sep-1951  MRN: [id number]  Age / Sex: 68 y.o., female  PCP: Valerie Roys, DO Referring Physician: Fritzi Mandes, MD  Reason for Consultation: Establishing goals of care  HPI/Patient Profile: 68 y.o. female  with past medical history of COPD on home oxygen 3L [place], smoker, obesity, HTN, HLD, restless leg, DM, depression, anxiety, CKD admitted on 12/24/2018 with respiratory distress and fall. Placed on BiPAP in ED but failed BiPAP and required intubation/mechanical ventilation with ICU admission. Patient with acute on chronic hypercarbic respiratory failure (PCO2 of 98) due to COPD exacerbation and viral bronchitis. Remains intubated and sedated on ventilator. Palliative medicine consultation for goals of care.   Clinical Assessment and Goals of Care:  I have reviewed medical records, discussed with care team and assessed the patient at bedside. Intubated/sedated and unable to participate in [place] conversation. Appears comfortable. No family at bedside.   Long discussion with daughter, Benjamine Mola via telephone. Benjamine Mola lives 3.5 hours away and unable to visit the hospital again until Saturday, 12-28-18.  I introduced Palliative Medicine as specialized medical care for people living with serious illness. It focuses on providing relief from the symptoms and stress of a serious illness.   We discussed a brief life review of the patient. Prior to admission, living home independently with baseline COPD requiring 3L [place].   Discussed events leading up to admission and course of hospitalization including diagnoses and interventions. Discussed guarded prognosis with underlying chronic respiratory failure due to COPD. Explained challenges with weaning her due to agitation and ability to safely get her off of ventilator with progressive, lung disease  previously requiring oxygen support.   Advanced directives, concepts specific to code status, and artifical feeding and hydration were discussed. Patient does NOT have a documented living will. No spouse. Benjamine Mola is automatically HCPOA along with her brother but he unfortunately lives in [place].   Benjamine Mola is surprised to hear her mother's code status was changed to DNR (after discussion with PCCM and other family members yesterday). Benjamine Mola shares that her mother has a DNR form but in the ED, Halla told Benjamine Mola to keep the DNR form from the care team because she wanted "everything done." Explained that everything possible is being done at this point. Explained concern to Benjamine Mola that if her mother's heart did stop and she died, the fear of her not surviving resuscitative efforts with underlying critical condition and co-morbidities. Also causing harm and suffering at EOL. After further discussion, Benjamine Mola agrees with decision for DNR in the event of cardiac arrest. She wishes to continue current plan of care and watchful waiting with hopes that her mother could wean from the ventilator. I do believe Benjamine Mola understands critical condition and that her mother may not be able to wean safely off the ventilator.   Explained process of continued aggressive interventions (trach/peg/SNF) vs comfort measures/compassionate extubation if unable to wean from ventilator. Benjamine Mola speaks of doing "whatever we can do" but her  mother would NOT wish for prolonged heroic interventions including trach/peg placement. Benjamine Mola shares that if her mother declines further and appears to be "actively dying" to "keep her comfortable."  Benjamine Mola plans to arrive at [facility] on Saturday ~12 to 1pm. Explained that PMT is not at [facility] over the weekend but further discussions can occur with critical care team. Benjamine Mola shares that her and aunt Burman Nieves) have not talked in years. Benjamine Mola gives me permission to discuss plan of  care with [facility]. Encouraged she communicate with her aunt and the rest of the family so everyone understands/on the same page with plan of care. PMT contact information given.     **Also spoke with patient's sister, Brantley Stage, via telephone. Maggie lives in [place] and plans to be at [facility] Friday afternoon. Also encouraged Burman Nieves to be present Saturday afternoon when Benjamine Mola is in town.   Also discussed course of hospitalization including diagnoses, interventions, poor prognosis with sister. Maggie understands that Velina is very sick. She also confirms prior DNR form and wishes against prolonged, heroic interventions. Burman Nieves shares her sister would NOT wish for trach/peg if not getting better. "Tandy would not want that." She shares that Letrice "hasn't had quality of life" for many years. If she does not improve this hospitalization, Burman Nieves wishes for "respect and dignity" at EOL. She understands challenges with weaning due to underlying agitation and chronic, progressive lung disease. Discussed process of compassionate extubation if Isaura cannot safely wean off ventilator. PMT contact information given. Answered all questions and concerns.     SUMMARY OF RECOMMENDATIONS    DNR in the event of cardiac arrest. Otherwise, continue current plan of care and watchful waiting.   Daughter lives 3.5 hours away. She plans to come to [facility] Saturday, 20-Dec-2018 for further discussions with care team.   Sister lives in [place] and plans to visit Friday afternoon. (Sister is technically not main Media planner. Daughter is not documented HCPOA but legally as next of kin, she is HCPOA). Encouraged daughter and sister to communicate and continue to discuss GOC/EOL wishes.  Per family, patient does NOT have documented living will or HCPOA but has spoken of wishes for DNR and also against prolonged, heroic interventions including trach/peg.   If patient unable to safely extubate or with further clinical decline,  family will likely agree with compassionate extubation to comfort measures.   PMT will follow.   Code Status/Advance Care Planning:  DNR  Symptom Management:   Per attending  Palliative Prophylaxis:   Aspiration, Delirium Protocol, Frequent Pain Assessment, Oral Care and Turn Reposition  Additional Recommendations (Limitations, Scope, Preferences):  DNR in the event of cardiac arrest. Otherwise, continue FULL scope treatment and current plan of care.  Psycho-social/Spiritual:   Desire for further Chaplaincy support:yes  Additional Recommendations: Caregiving  Support/Resources, Compassionate Wean Education and Education on Hospice  Prognosis:   Poor prognosis with acute on chronic respiratory failure, COPD exacerbation, viral bronchitis, AKI on CKD, and severe agitation on ventilator making it difficult to wean.   Discharge Planning: To Be Determined      Primary Diagnoses: Present on Admission: . Acute hypercapnic respiratory failure ([place])   I have reviewed the medical record, interviewed the patient and family, and examined the patient. The following aspects are pertinent.  Past Medical History:  Diagnosis Date  . Acute respiratory failure ([place]) 2015   twice  . Anxiety   . Chronic kidney disease   . COPD (chronic obstructive pulmonary disease) ([place])   . Depression   .  Diabetes mellitus without complication ([place])   . Hyperlipidemia   . Hypertension   . Meniscus tear    right knee  . Obesity   . Restless leg syndrome   . Tobacco abuse    Social History   Socioeconomic History  . Marital status: Single    Spouse name: Not on file  . Number of children: Not on file  . Years of education: Not on file  . Highest education level: Not on file  Occupational History  . Occupation: retired  Scientific laboratory technician  . Financial resource strain: Somewhat hard  . Food insecurity:    Worry: Never true    Inability: Never true  . Transportation needs:    Medical: Yes      Non-medical: Yes  Tobacco Use  . Smoking status: Former Smoker    Packs/day: 1.00    Years: 0.00    Pack years: 0.00    Types: Cigarettes, E-cigarettes    Last attempt to quit: 12/20/1913    Years since quitting: 105.0  . Smokeless tobacco: Never Used  . Tobacco comment: Happy that I quit still miss it  Substance and Sexual Activity  . Alcohol use: No  . Drug use: No  . Sexual activity: Never    Birth control/protection: None  Lifestyle  . Physical activity:    Days per week: 0 days    Minutes per session: 0 min  . Stress: Patient refused  Relationships  . Social connections:    Talks on phone: More than three times a week    Gets together: Once a week    Attends religious service: Never    Active member of club or organization: Yes    Attends meetings of clubs or organizations: Never    Relationship status: Divorced  Other Topics Concern  . Not on file  Social History Narrative  . Not on file   Family History  Problem Relation Age of Onset  . Alcohol abuse Mother   . Heart disease Mother   . Hypertension Mother   . Mental illness Mother   . Stroke Mother   . Heart disease Father   . Hyperlipidemia Father   . Drug abuse Sister   . Migraines Sister   . Drug abuse Brother   . Alcohol abuse Brother   . Heart disease Brother   . Hyperlipidemia Brother   . Bipolar disorder Daughter   . Anxiety disorder Daughter   . Bipolar disorder Son   . Alcohol abuse Son   . Alcohol abuse Brother   . Drug abuse Brother   . Drug abuse Brother   . Alcohol abuse Brother   . Asthma Brother   . Raynaud syndrome Brother   . Heart disease Maternal Grandmother   . Diabetes Paternal Grandmother    Scheduled Meds: . aspirin  81 mg Per Tube Daily  . chlorhexidine gluconate (MEDLINE KIT)  15 mL Mouth Rinse BID  . clonazePAM  0.5 mg Per Tube BID  . enoxaparin (LOVENOX) injection  40 mg Subcutaneous Q24H  . free water  30 mL Per Tube Q4H  . insulin aspart  0-9 Units Subcutaneous  Q4H  . ipratropium-albuterol  3 mL Nebulization Q4H  . mouth rinse  15 mL Mouth Rinse 10 times per day  . methylPREDNISolone (SOLU-MEDROL) injection  40 mg Intravenous Q12H  . multivitamin  15 mL Per Tube Daily  . pantoprazole sodium  40 mg Per Tube Daily  . polyethylene glycol  17 g  Per Tube Daily  . pravastatin  40 mg Per Tube QHS  . rOPINIRole  0.5 mg Per Tube BID  . senna-docusate  1 tablet Oral BID  . sertraline  100 mg Per Tube QHS  . sodium chloride flush  10-40 mL Intracatheter Q12H   Continuous Infusions: . sodium chloride 80 mL/hr at 11/30/18 1352  . azithromycin Stopped (11/30/18 1049)  . feeding supplement (VITAL HIGH PROTEIN) 1,000 mL (11/30/18 1325)  . fentaNYL infusion INTRAVENOUS 400 mcg/hr (11/30/18 1457)  . norepinephrine (LEVOPHED) Adult infusion 1 mcg/min (11/30/18 1352)  . propofol (DIPRIVAN) infusion 35 mcg/kg/min (11/30/18 1352)   PRN Meds:.acetaminophen **OR** acetaminophen, bisacodyl, fentaNYL, ipratropium-albuterol, midazolam, morphine injection, ondansetron **OR** ondansetron (ZOFRAN) IV, sodium chloride flush Medications Prior to Admission:  Prior to Admission medications   Medication Sig Start Date End Date Taking? Authorizing Provider  albuterol (PROVENTIL HFA;VENTOLIN HFA) 108 (90 Base) MCG/ACT inhaler Inhale 1-2 puffs into the lungs every 4 (four) hours as needed for wheezing or shortness of breath. 08/05/18  Yes Johnson, Ladaja Yusupov P, DO  albuterol (PROVENTIL) (2.5 MG/3ML) 0.083% nebulizer solution USE 1 VIAL VIA NEBULIZER EVERY 6 HOURS AS NEEDED FOR WHEEZING OR SHORTNESS OF BREATH Patient taking differently: Take 3 mLs by nebulization every 6 (six) hours as needed.  10/17/18  Yes Johnson, Vernida Mcnicholas P, DO  aspirin EC 81 MG tablet Take 81 mg by mouth daily.   Yes [provider]  atenolol (TENORMIN) 50 MG tablet Take 1 tablet (50 mg total) by mouth daily. 08/05/18  Yes Johnson, Armoni Depass P, DO  B Complex Vitamins (B COMPLEX PO) Take 1 Dose by mouth daily.    Yes  [provider]  budesonide-formoterol (SYMBICORT) 160-4.5 MCG/ACT inhaler Inhale 2 puffs into the lungs 2 (two) times daily. 08/18/18  Yes Johnson, Kiarrah Rausch P, DO  Calcium Carbonate-Vit D-Min (CALCIUM 1200 PO) Take 1 tablet by mouth daily.    Yes [provider]  clonazePAM (KLONOPIN) 1 MG tablet Take 1 tablet (1 mg total) by mouth daily as needed. 09/01/18 11/30/18 Yes Johnson, Adalie Mand P, DO  gabapentin (NEURONTIN) 100 MG capsule Take 2 capsules (200 mg total) by mouth 4 (four) times daily. 08/18/18  Yes Johnson, Lyris Hitchman P, DO  glucose blood (ONE TOUCH ULTRA TEST) test strip TEST once daily as directed 08/05/18  Yes Johnson, Belal Scallon P, DO  hydrochlorothiazide (HYDRODIURIL) 25 MG tablet Take 1 tablet (25 mg total) by mouth daily. 08/18/18  Yes Johnson, Shrihaan Porzio P, DO  Lipoic Acid 150 MG CAPS Take 150 mg by mouth daily.    Yes [provider]  losartan (COZAAR) 25 MG tablet Take 1 tablet (25 mg total) by mouth daily. 08/05/18  Yes Johnson, Lenola Lockner P, DO  meclizine (ANTIVERT) 25 MG tablet Take 1 tablet (25 mg total) by mouth 3 (three) times daily as needed for dizziness. 08/05/18  Yes Johnson, Jennifier Smitherman P, DO  Multiple Vitamins-Minerals (MULTIVITAMIN GUMMIES ADULTS PO) Take 1 tablet by mouth daily.    Yes [provider]  pravastatin (PRAVACHOL) 40 MG tablet Take 1 tablet (40 mg total) by mouth at bedtime. 08/05/18  Yes Johnson, Trayce Maino P, DO  rOPINIRole (REQUIP) 0.5 MG tablet Take 1 tablet (0.5 mg total) by mouth 3 (three) times daily. 08/05/18  Yes Johnson, Izzah Pasqua P, DO  sertraline (ZOLOFT) 100 MG tablet Take 1 tablet (100 mg total) by mouth daily. 08/05/18  Yes Johnson, Samule Life P, DO  theophylline (UNIPHYL) 400 MG 24 hr tablet Take 1 tablet (400 mg total) by mouth daily. 08/05/18  Yes Johnson, Maxson Oddo P, DO  tiotropium (SPIRIVA HANDIHALER) 18 MCG inhalation capsule inhale the contents of one capsule in the handihaler once daily 08/05/18  Yes Johnson, Olesya Wike P, DO  Vitamin D, Cholecalciferol, 1000  units CAPS Take 1,000 Units by mouth daily.    Yes [provider]  predniSONE (DELTASONE) 10 MG tablet 6 tabs today and tomorrow, 5 tabs next 2 days, decrease by 1 every other day until gone. Patient not taking: Reported on 11/28/2018 09/15/18   Park Liter P, DO   Allergies  Allergen Reactions  . Percocet [Oxycodone-Acetaminophen] Nausea And Vomiting  . Chantix [Varenicline] Other (See Comments)    Severe headache and vomiting  . Fluoxetine Other (See Comments)    Headaches  . Lisinopril Cough  . Other     Other reaction(s): Unknown Contrast  . Verapamil Other (See Comments)    Lazy   Review of Systems  Unable to perform ROS: Acuity of condition   Physical Exam Vitals signs and nursing note reviewed.  Constitutional:      Interventions: She is sedated and intubated.  HENT:     Head: Normocephalic and atraumatic.  Cardiovascular:     Rate and Rhythm: Normal rate.  Pulmonary:     Effort: No tachypnea, accessory muscle usage or respiratory distress. She is intubated.     Breath sounds: Decreased breath sounds present.  Abdominal:     Tenderness: There is no abdominal tenderness.  Skin:    General: Skin is warm and dry.     Coloration: Skin is pale.     Findings: Ecchymosis present.  Neurological:     GCS: GCS eye subscore is 2. GCS verbal subscore is 1. GCS motor subscore is 4.  Psychiatric:     Comments: Unable to assess-intubated/sedated    Vital Signs: BP (!) 97/57   Pulse 64   Temp 98 F (36.7 C) (Axillary)   Resp 12   Ht _0  (1.575 m)   Wt 103.8 kg   SpO2 100%   BMI 41.85 kg/m  Pain Scale: CPOT   Pain Score: 0-No pain   SpO2: SpO2: 100 % O2 Device:SpO2: 100 % O2 Flow Rate: .O2 Flow Rate (L/min): 3 L/min  IO: Intake/output summary:   Intake/Output Summary (Last 24 hours) at 11/30/2018 1526 Last data filed at 11/30/2018 1352 Gross per 24 hour  Intake 4542.95 ml  Output 640 ml  Net 3902.95 ml    LBM: Last BM Date: (UTA/PTA) Baseline  Weight: Weight: 104.3 kg Most recent weight: Weight: 103.8 kg     Palliative Assessment/Data: PPS 10%   Flowsheet Rows     Most Recent Value  Intake Tab  Referral Department  Critical care  Unit at Time of Referral  ICU  Palliative Care Primary Diagnosis  Pulmonary  Date Notified  11/29/18  Palliative Care Type  New Palliative care  Reason for referral  Clarify Goals of Care, End of Life Care Assistance  Date of Admission  12/13/2018  Date first seen by Palliative Care  11/30/18  # of days IP prior to Palliative referral  2  Clinical Assessment  Palliative Performance Scale Score  10%  Psychosocial & Spiritual Assessment  Palliative Care Outcomes  Patient/Family meeting held?  Yes  Who was at the meeting?  spoke with daughter and then sister via telephone  Palliative Care Outcomes  Clarified goals of care, Provided end of life care assistance, Provided psychosocial or spiritual support, ACP counseling assistance, Counseled regarding hospice  Time In: 1300 Time Out: 1420 Time Total: 80 Greater than 50%  of this time was spent counseling and coordinating care related to the above assessment and plan.  Signed by:  Ihor Dow, FNP-C Palliative Medicine Team  Phone: [phone] Fax: [phone]   Please contact Palliative Medicine Team phone at [phone] for questions and concerns.  For individual provider: See Shea Evans

## 2018-11-30 NOTE — Progress Notes (Addendum)
Inpatient Diabetes Program Recommendations  AACE/ADA: New Consensus Statement on Inpatient Glycemic Control (2015)  Target Ranges:  Prepandial:   less than 140 mg/dL      Peak postprandial:   less than 180 mg/dL (1-2 hours)      Critically ill patients:  140 - 180 mg/dL   Lab Results  Component Value Date   GLUCAP 280 (H) 11/30/2018   HGBA1C 6.6 (H) 14-Dec-2018    Review of Glycemic Control Results for [name], [name] (MRN [id number]) as of 11/30/2018 11:17  Ref. Range 11/29/2018 16:42 11/29/2018 19:24 11/29/2018 23:43 11/30/2018 03:28 11/30/2018 07:29  Glucose-Capillary Latest Ref Range: 70 - 99 mg/dL 379 (H) 432 (H) 761 (H) 190 (H) 280 (H)  Diabetes history: DM2 (diet controlled) Outpatient Diabetes medications: None Current orders for Inpatient glycemic control: Novolog 0-9 units q 4 hours,  Solumedrol 40 mg Q12H Vital High protein 50 ml/hr Inpatient Diabetes Program Recommendations:  Consider adding Novolog tube feed coverage 2 units q 4 hours (hold if tube feeds held).   Thanks,  Lindsey Meager, RN, BC-ADM Inpatient Diabetes Coordinator Pager [phone] (8a-5p)

## 2018-11-30 NOTE — Progress Notes (Signed)
[facility] - [place] [facility]   PATIENT NAME: [name]    MR#:  [id number]  DATE OF BIRTH:  Dec 15, 1950  SUBJECTIVE:   Patient came in with increasing shortness of breath and cough. Veins intubated  currently  IV precedex and fentanyl. On IV levophed REVIEW OF SYSTEMS:   Review of Systems  Unable to perform ROS: Intubated   Tolerating Diet:TF  DRUG ALLERGIES:   Allergies  Allergen Reactions  . Percocet [Oxycodone-Acetaminophen] Nausea And Vomiting  . Chantix [Varenicline] Other (See Comments)    Severe headache and vomiting  . Fluoxetine Other (See Comments)    Headaches  . Lisinopril Cough  . Other     Other reaction(s): Unknown Contrast  . Verapamil Other (See Comments)    Lazy    VITALS:  Blood pressure (!) 97/55, pulse 69, temperature 98 F (36.7 C), temperature source Axillary, resp. rate 14, height 5\' 2"  (1.575 m), weight 103.8 kg, SpO2 100 %.  PHYSICAL EXAMINATION:   Physical Exam  GENERAL:  68 y.o.-year-old patient lying in the bed with no acute distress. Morbidly obese critically ill  EYES: Pupils equal, round, reactive to light and accommodation. No scleral icterus.  HEENT: Head atraumatic, normocephalic. Oropharynx and nasopharynx clear. Intubated on the ventilator NECK:  Supple, no jugular venous distention. No thyroid enlargement, no tenderness.  LUNGS: Normal breath sounds bilaterally, no wheezing, rales, rhonchi. No use of accessory muscles of respiration.  CARDIOVASCULAR: S1, S2 normal. No murmurs, rubs, or gallops. Mild tachycardia ABDOMEN: Soft, nontender, nondistended. Bowel sounds present. No organomegaly or mass.  EXTREMITIES: No cyanosis, clubbing or edema b/l.    NEUROLOGIC: on the ventilator  PSYCHIATRIC:  patient is sedated  SKIN: No obvious rash, lesion, or ulcer.   LABORATORY PANEL:  CBC Recent Labs  Lab 11/30/18 0359  WBC 15.1*  HGB 8.0*  HCT 26.2*  PLT 176    Chemistries  Recent Labs  Lab  12/27/2018 1256  11/30/18 0359  NA 141   < > 139  K 3.3*   < > 4.4  CL 87*   < > 100  CO2 45*   < > 31  GLUCOSE 211*   < > 219*  BUN 21   < > 59*  CREATININE 0.75   < > 1.66*  CALCIUM 9.0   < > 7.2*  MG  --    < > 2.3  AST 17  --   --   ALT 15  --   --   ALKPHOS 78  --   --   BILITOT 0.6  --   --    < > = values in this interval not displayed.   Cardiac Enzymes Recent Labs  Lab 12/07/2018 1256  TROPONINI <0.03   RADIOLOGY:  Dg Chest Port 1 View  Result Date: 11/30/2018 CLINICAL DATA:  Acute respiratory failure EXAM: PORTABLE CHEST 1 VIEW COMPARISON:  11/29/2018 FINDINGS: Cardiac shadow is stable. Endotracheal tube and nasogastric catheter are noted in satisfactory position. Right jugular central line is again seen. Bibasilar atelectasis is seen. Hyperinflation is again identified. No focal confluent infiltrate is seen. IMPRESSION: Mild bibasilar atelectasis. Tubes and lines as described. Electronically Signed   By: Alcide Clever M.D.   On: 11/30/2018 07:07   Portable Chest Xray  Result Date: 11/29/2018 CLINICAL DATA:  Intubation EXAM: PORTABLE CHEST 1 VIEW COMPARISON:  Yesterday FINDINGS: Endotracheal tube tip between the clavicular heads and carina. The orogastric tube at least reaches the stomach. Right  IJ line with tip at the SVC. Large lung volumes with interstitial coarsening at the bases. Normal heart size and mediastinal contours. No effusion or pneumothorax. IMPRESSION: 1. Stable and unremarkable hardware positioning. 2. COPD with stable aeration. Electronically Signed   By: Marnee Spring M.D.   On: 11/29/2018 06:18   ASSESSMENT AND PLAN:  Angula Woolf  is a 68 y.o. female with a known history of COPD on oxygen presents with a fall.  She was in respiratory distress and was placed on BiPAP for elevated carbon dioxide on ABG.   1.  Acute on chronic hypercarbic respiratory failure.  - PCO2 of 98.   -was emergently intubated and on the ventilator. She failed BiPAP. -Continue IV  Solu-Medrol and inhalers and nebulizer  2.  COPD exacerbation  With possible pneumonia. -  Empiric antibiotics with\Zithromax.  - Solu-Medrol and nebulizer treatments.  3.  Acute encephalopathy likely secondary to high carbon dioxide.  - Hold gabapentin and Requip.    4.  Depression.  Continue Zoloft.  Decrease dose of Klonopin.  5.  Type 2 diabetes mellitus.  Put on sliding scale.  6.  Hyperlipidemia on Pravachol  Critically ill. Long-term poor prognosis. Palliative care to meet family  CODE STATUS: DNR  DVT Prophylaxis: Lovenox  TOTAL TIME TAKING CARE OF THIS PATIENT: *30** minutes.  >50% time spent on counselling and coordination of care  POSSIBLE D/C IN **??* DAYS, DEPENDING ON CLINICAL CONDITION.  Note: This dictation was prepared with Dragon dictation along with smaller phrase technology. Any transcriptional errors that result from this process are unintentional.  Enedina Finner M.D on 11/30/2018 at 2:52 PM  Between 7am to 6pm - Pager - [phone]  After 6pm go to [email or web address] - Social research officer, government  [facility]  Office  [phone]  CC: Primary care physician; [name], DOPatient ID: Smitty Knudsen, female   DOB: 22-Aug-1951, 68 y.o.   MRN: [id number]

## 2018-11-30 NOTE — Progress Notes (Signed)
Follow up - Critical Care Medicine Note  Patient Details:    [name] is an 68 y.o. female. 68 yo female, current smoker, admitted with acute on chronic respiratory failure secondary to AECOPD and viral bronchitis requiring Bipap.  Failed BiPAP and required intubation/mechanical ventilation.   Previously DNR.  Lines, Airways, Drains: Airway 7.5 mm (Active)  Secured at (cm) 25 cm 11/29/2018  8:00 PM  Measured From Lips 11/29/2018  8:00 PM  Secured Location Right 11/29/2018  7:15 PM  Secured By [employer] Tube Holder 11/29/2018  7:15 PM  Tube Holder Repositioned Yes 11/29/2018  7:15 PM  Cuff Pressure (cm H2O) 26 cm H2O 11/29/2018  7:15 PM  Site Condition Dry 11/29/2018  7:15 PM     CVC Triple Lumen 11/28/18 Right Internal jugular (Active)  Indication for Insertion or Continuance of Line Vasoactive infusions 11/29/2018  8:00 PM  Site Assessment Bleeding;Other (Comment) 11/29/2018  8:00 AM  Proximal Lumen Status Infusing;Flushed;Blood return noted 11/29/2018  8:00 AM  Medial Lumen Status Infusing;Flushed;Blood return noted 11/29/2018  8:00 AM  Distal Lumen Status Infusing;Flushed;Blood return noted 11/29/2018  8:00 AM  Dressing Type Occlusive;Gauze;Pressure 11/29/2018  8:00 AM  Dressing Status New drainage;Old drainage;Intact 11/29/2018  8:00 AM  Line Care Connections checked and tightened 11/29/2018  8:00 AM  Dressing Intervention Dressing reinforced 11/29/2018  8:00 AM  Dressing Change Due 12/05/18 11/29/2018  8:00 AM     NG/OG Tube Orogastric Center mouth Xray Documented cm marking at nare/ corner of mouth 55 cm (Active)  Cm Marking at Nare/Corner of Mouth (if applicable) 55 cm 11/29/2018  4:00 PM  External Length of Tube (cm) - (if applicable) 55 cm 11/28/2018  7:20 PM  Site Assessment Clean;Dry;Intact 11/29/2018  4:00 PM  Ongoing Placement Verification No change in cm markings or external length of tube from initial placement;No change in respiratory status;No acute changes, not attributed to clinical condition  11/29/2018  4:00 PM  Status Infusing tube feed 11/29/2018  4:00 PM  Intake (mL) 100 mL 11/28/2018 12:00 PM  Output (mL) 55 mL 11/29/2018  5:00 AM     Urethral Catheter BLRC, RN Non-latex (Active)  Indication for Insertion or Continuance of Catheter Unstable critically ill patients first 24-48 hours (See Criteria) 11/29/2018  4:00 PM  Site Assessment Clean;Intact;Dry 11/29/2018  4:00 PM  Catheter Maintenance Bag below level of bladder;No dependent loops;Catheter secured;Drainage bag/tubing not touching floor;Seal intact;Insertion date on drainage bag 11/29/2018  8:00 PM  Collection Container Standard drainage bag 11/29/2018  4:00 PM  Securement Method Securing device (Describe) 11/29/2018  4:00 PM  Urinary Catheter Interventions Unclamped 11/29/2018  4:00 PM  Output (mL) 110 mL 11/29/2018  8:00 PM    Anti-infectives:  Anti-infectives (From admission, onward)   Start     Dose/Rate Route Frequency Ordered Stop   11/28/18 2200  levofloxacin (LEVAQUIN) IVPB 500 mg  Status:  Discontinued     500 mg 100 mL/hr over 60 Minutes Intravenous Every 24 hours 11/28/18 0728 11/28/18 1014   11/28/18 1800  azithromycin (ZITHROMAX) 500 mg in sodium chloride 0.9 % 250 mL IVPB  Status:  Discontinued     500 mg 250 mL/hr over 60 Minutes Intravenous Daily-1800 11/28/18 1014 11/28/18 1015   11/28/18 1030  azithromycin (ZITHROMAX) 500 mg in sodium chloride 0.9 % 250 mL IVPB     500 mg 250 mL/hr over 60 Minutes Intravenous Daily 11/28/18 1015     25-Sep-2019 1515  cefTRIAXone (ROCEPHIN) 2 g in sodium chloride 0.9 %  100 mL IVPB  Status:  Discontinued     2 g 200 mL/hr over 30 Minutes Intravenous Every 24 hours 11/30/2018 1508 12/05/2018 1801   12/07/2018 1515  azithromycin (ZITHROMAX) 500 mg in sodium chloride 0.9 % 250 mL IVPB  Status:  Discontinued     500 mg 250 mL/hr over 60 Minutes Intravenous Every 24 hours 12/18/2018 1508 11/28/18 0717   12/02/2018 1430  ceFEPIme (MAXIPIME) 1 g in sodium chloride 0.9 % 100 mL IVPB  Status:  Discontinued      1 g 200 mL/hr over 30 Minutes Intravenous  Once 12/19/2018 1424 12/08/2018 1508      Microbiology: Results for orders placed or performed during the hospital encounter of 12/25/2018  Urine culture     Status: None   Collection Time: 12/06/2018  2:57 PM  Result Value Ref Range Status   Specimen Description   Final    URINE, RANDOM Performed at [facility], [address] [place], [place] [address]    Special Requests   Final    NONE Performed at [facility], [address], [place], [place] [address]    Culture   Final    NO GROWTH Performed at [facility], [address] [place], [place] [address]    Report Status 11/28/2018 FINAL  Final  Culture, blood (routine x 2)     Status: None (Preliminary result)   Collection Time: 12/09/2018  3:01 PM  Result Value Ref Range Status   Specimen Description BLOOD RAC  Final   Special Requests   Final    BOTTLES DRAWN AEROBIC AND ANAEROBIC Blood Culture adequate volume   Culture   Final    NO GROWTH 3 DAYS Performed at [facility], [address] [place], [place] [address]    Report Status PENDING  Incomplete  Culture, blood (routine x 2)     Status: None (Preliminary result)   Collection Time: 11/28/2018  4:06 PM  Result Value Ref Range Status   Specimen Description BLOOD BLOOD RIGHT HAND  Final   Special Requests   Final    BOTTLES DRAWN AEROBIC AND ANAEROBIC Blood Culture results may not be optimal due to an excessive volume of blood received in culture bottles   Culture   Final    NO GROWTH 3 DAYS Performed at [facility], [address] [place], [place] [address]    Report Status PENDING  Incomplete  Respiratory Panel by PCR     Status: None   Collection Time: 12/12/2018  5:21 PM  Result Value Ref Range Status   Adenovirus NOT DETECTED NOT DETECTED Final   Coronavirus 229E NOT DETECTED NOT DETECTED Final    Comment: (NOTE) The Coronavirus on the Respiratory Panel, DOES NOT test  for the novel  Coronavirus (2019 nCoV)    Coronavirus HKU1 NOT DETECTED NOT DETECTED Final   Coronavirus NL63 NOT DETECTED NOT DETECTED Final   Coronavirus OC43 NOT DETECTED NOT DETECTED Final   Metapneumovirus NOT DETECTED NOT DETECTED Final   Rhinovirus / Enterovirus NOT DETECTED NOT DETECTED Final   Influenza A NOT DETECTED NOT DETECTED Final   Influenza B NOT DETECTED NOT DETECTED Final   Parainfluenza Virus 1 NOT DETECTED NOT DETECTED Final   Parainfluenza Virus 2 NOT DETECTED NOT DETECTED Final   Parainfluenza Virus 3 NOT DETECTED NOT DETECTED Final   Parainfluenza Virus 4 NOT DETECTED NOT DETECTED Final   Respiratory Syncytial Virus NOT DETECTED NOT DETECTED Final  Bordetella pertussis NOT DETECTED NOT DETECTED Final   Chlamydophila pneumoniae NOT DETECTED NOT DETECTED Final   Mycoplasma pneumoniae NOT DETECTED NOT DETECTED Final  MRSA PCR Screening     Status: None   Collection Time: 12/08/2018  5:21 PM  Result Value Ref Range Status   MRSA by PCR NEGATIVE NEGATIVE Final    Comment:        The GeneXpert MRSA Assay (FDA approved for NASAL specimens only), is one component of a comprehensive MRSA colonization surveillance program. It is not intended to diagnose MRSA infection nor to guide or monitor treatment for MRSA infections. Performed at [facility], [address] [place], [place] [address]   Culture, respiratory (non-expectorated)     Status: None   Collection Time: 11/28/18 12:00 PM  Result Value Ref Range Status   Specimen Description   Final    TRACHEAL ASPIRATE Performed at [facility], [address] [place], [place] [address]    Special Requests   Final    NONE Performed at [facility], [address] [place], [place] [address]    Gram Stain   Final    ABUNDANT WBC PRESENT, PREDOMINANTLY MONONUCLEAR ABUNDANT GRAM POSITIVE COCCI    Culture   Final    Consistent with normal respiratory flora. Performed at [facility], [address] [place], [place] [address]    Report Status 11/30/2018 FINAL  Final    Best Practice/Protocols:  VTE Prophylaxis: Lovenox (prophylaxtic dose) GI Prophylaxis: Proton Pump Inhibitor Continous Sedation Precedex d/cd Propofol+ Fentanyl  Events: Intubated, mechanically ventilated 3/2  Studies: Dg Abd 1 View  Result Date: 11/28/2018 CLINICAL DATA:  OG tube placement. EXAM: ABDOMEN - 1 VIEW COMPARISON:  None. FINDINGS: Tip of the enteric tube below the diaphragm in the stomach, the side port is not well visualized but likely just beyond the gastroesophageal junction. No evidence of free air in the upper abdomen. Cholecystectomy clips noted. IMPRESSION: Tip of the enteric tube below the diaphragm in the stomach. The side-port is not well visualized but likely just beyond the gastroesophageal junction. Electronically Signed   By: Narda Rutherford M.D.   On: 11/28/2018 01:34   Ct Head Wo Contrast  Result Date: 11/30/2018 CLINICAL DATA:  Status post fall, altered mental status EXAM: CT HEAD WITHOUT CONTRAST CT CERVICAL SPINE WITHOUT CONTRAST TECHNIQUE: Multidetector CT imaging of the head and cervical spine was performed following the standard protocol without intravenous contrast. Multiplanar CT image reconstructions of the cervical spine were also generated. COMPARISON:  None. FINDINGS: CT HEAD FINDINGS Brain: No evidence of acute infarction, hemorrhage, hydrocephalus, extra-axial collection or mass lesion/mass effect. Vascular: No hyperdense vessel or unexpected calcification. Skull: No acute calvarial injury.  Bilateral parietal burr holes. Sinuses/Orbits: Osteoma in the left posterior ethmoid sinus. Remainder the paranasal sinuses are clear. Visualized mastoid sinuses are clear. Visualized orbits demonstrate no focal abnormality. Other: None CT CERVICAL SPINE FINDINGS Alignment: 1-2 mm anterolisthesis of C3 on C4. Skull base and vertebrae: No acute fracture. No primary bone  lesion or focal pathologic process. Soft tissues and spinal canal: No prevertebral fluid or swelling. No visible canal hematoma. Disc levels: Degenerative disc disease with disc height loss at C4-5, C5-6 and C6-7. Moderate right facet arthropathy at C2-3. Bilateral uncovertebral degenerative changes at C5-6 with mild foraminal narrowing. Upper chest: Lung apices are clear. Other: No fluid collection or hematoma. IMPRESSION: 1. No acute intracranial pathology. 2.  No acute osseous injury of the cervical spine.  Electronically Signed   By: Elige Ko   On: 2018-11-28 15:02   Ct Cervical Spine Wo Contrast  Result Date: 11/28/2018 CLINICAL DATA:  Status post fall, altered mental status EXAM: CT HEAD WITHOUT CONTRAST CT CERVICAL SPINE WITHOUT CONTRAST TECHNIQUE: Multidetector CT imaging of the head and cervical spine was performed following the standard protocol without intravenous contrast. Multiplanar CT image reconstructions of the cervical spine were also generated. COMPARISON:  None. FINDINGS: CT HEAD FINDINGS Brain: No evidence of acute infarction, hemorrhage, hydrocephalus, extra-axial collection or mass lesion/mass effect. Vascular: No hyperdense vessel or unexpected calcification. Skull: No acute calvarial injury.  Bilateral parietal burr holes. Sinuses/Orbits: Osteoma in the left posterior ethmoid sinus. Remainder the paranasal sinuses are clear. Visualized mastoid sinuses are clear. Visualized orbits demonstrate no focal abnormality. Other: None CT CERVICAL SPINE FINDINGS Alignment: 1-2 mm anterolisthesis of C3 on C4. Skull base and vertebrae: No acute fracture. No primary bone lesion or focal pathologic process. Soft tissues and spinal canal: No prevertebral fluid or swelling. No visible canal hematoma. Disc levels: Degenerative disc disease with disc height loss at C4-5, C5-6 and C6-7. Moderate right facet arthropathy at C2-3. Bilateral uncovertebral degenerative changes at C5-6 with mild foraminal  narrowing. Upper chest: Lung apices are clear. Other: No fluid collection or hematoma. IMPRESSION: 1. No acute intracranial pathology. 2.  No acute osseous injury of the cervical spine. Electronically Signed   By: Elige Ko   On: November 28, 2018 15:02   Dg Chest Port 1 View  Result Date: 11/30/2018 CLINICAL DATA:  Acute respiratory failure EXAM: PORTABLE CHEST 1 VIEW COMPARISON:  11/29/2018 FINDINGS: Cardiac shadow is stable. Endotracheal tube and nasogastric catheter are noted in satisfactory position. Right jugular central line is again seen. Bibasilar atelectasis is seen. Hyperinflation is again identified. No focal confluent infiltrate is seen. IMPRESSION: Mild bibasilar atelectasis. Tubes and lines as described. Electronically Signed   By: Alcide Clever M.D.   On: 11/30/2018 07:07   Portable Chest Xray  Result Date: 11/29/2018 CLINICAL DATA:  Intubation EXAM: PORTABLE CHEST 1 VIEW COMPARISON:  Yesterday FINDINGS: Endotracheal tube tip between the clavicular heads and carina. The orogastric tube at least reaches the stomach. Right IJ line with tip at the SVC. Large lung volumes with interstitial coarsening at the bases. Normal heart size and mediastinal contours. No effusion or pneumothorax. IMPRESSION: 1. Stable and unremarkable hardware positioning. 2. COPD with stable aeration. Electronically Signed   By: Marnee Spring M.D.   On: 11/29/2018 06:18   Dg Chest Port 1 View  Result Date: 11/28/2018 CLINICAL DATA:  Line placement EXAM: PORTABLE CHEST 1 VIEW COMPARISON:  11/28/2018, 2020-10-1610 FINDINGS: Endotracheal tube tip is about 2 cm superior to the carina. Right-sided central venous catheter tip over the SVC. No pneumothorax. Hyperinflation with mild bronchitic changes at the bases. Emphysematous disease. Stable slightly enlarged cardiomediastinal silhouette. IMPRESSION: 1. Endotracheal tube tip about 2 cm superior to carina. 2. Right-sided central venous catheter tip over the SVC. No pneumothorax 3.  Hyperinflation.  Stable interstitial opacities at the bases. Electronically Signed   By: Jasmine Pang M.D.   On: 11/28/2018 02:38   Portable Chest X-ray  Result Date: 11/28/2018 CLINICAL DATA:  Endotracheal tube placement. EXAM: PORTABLE CHEST 1 VIEW COMPARISON:  Radiographs yesterday. FINDINGS: Endotracheal tube tip 3.6 cm from the carina. Enteric tube in place, tip below the diaphragm, better assessed on concurrent abdominal radiographs. Mild cardiomegaly. Hyperinflation with chronic interstitial coarsening. No acute airspace disease, pneumothorax or large pleural effusion. IMPRESSION: 1.  Endotracheal tube tip 3.6 cm from the carina. Enteric tube in place, tip below the diaphragm. 2. Mild cardiomegaly and chronic hyperinflation. Electronically Signed   By: Narda Rutherford M.D.   On: 11/28/2018 01:32   Dg Chest Portable 1 View  Result Date: 12-02-18 CLINICAL DATA:  Acute shortness of breath EXAM: PORTABLE CHEST 1 VIEW COMPARISON:  03/02/2018 and prior chest radiographs FINDINGS: Cardiomegaly again noted. Probable COPD noted. Mild chronic interstitial opacities in the LOWER lungs again noted. There is no evidence of focal airspace disease, pulmonary edema, suspicious pulmonary nodule/mass, pleural effusion, or pneumothorax. No acute bony abnormalities are identified. IMPRESSION: No evidence of acute cardiopulmonary disease. Cardiomegaly, COPD type changes and mild chronic LOWER lung interstitial opacities. Electronically Signed   By: Harmon Pier M.D.   On: 12-02-18 13:32    Consults: Treatment Team:  Erin Fulling, MD   Subjective:    Overnight Issues: Overnight had issues with sedation.  Became very asynchronous with the ventilator.  Precedex was at max dose, it was discontinued and propofol restarted.  She is on propofol and fentanyl now synchronous with the ventilator but sedated and unable to follow commands.  Becomes agitated when sedation is lightened.  Objective:  Vital signs for last  24 hours: Temp:  [97.8 F (36.6 C)-99 F (37.2 C)] 97.8 F (36.6 C) (03/04 1926) Pulse Rate:  [64-98] 84 (03/04 2100) Resp:  [9-19] 13 (03/04 2100) BP: (76-196)/(41-172) 104/50 (03/04 2100) SpO2:  [88 %-100 %] 96 % (03/04 2100) FiO2 (%):  [35 %-40 %] 40 % (03/04 1926) Weight:  [103.8 kg] 103.8 kg (03/04 0500)  Hemodynamic parameters for last 24 hours:    Intake/Output from previous day: 03/03 0701 - 03/04 0700 In: 5071.8 [I.V.:3126.3; NG/GT:705; IV Piggyback:1240.5] Out: 448 [Urine:448]  Intake/Output this shift: Total I/O In: 402 [I.V.:402] Out: 125 [Urine:125]  Vent settings for last 24 hours: Vent Mode: PRVC FiO2 (%):  [35 %-40 %] 40 % Set Rate:  [14 bmp-18 bmp] 14 bmp Vt Set:  [400 mL] 400 mL PEEP:  [8 cmH20] 8 cmH20 Plateau Pressure:  [18 cmH20-29 cmH20] 29 cmH20  Physical Exam:  General: well developed, obese female, intubated/sedated/mechanically ventilated.  Synchronous with the ventilator only and deeply sedated.   Neuro:  Sedated.  HEENT: PERRL. No icterus Cardiovascular: nsr, rrr, no R/G Lungs: diminished throughout, coarse, no wheezes Abdomen: +BS x4, soft, obese, non distended Musculoskeletal: no edema, no clubbing  Skin: intact no lesions present   Assessment/Plan:  1.  Acute on chronic mixed respiratory failure with ventilator dependence due to acute exacerbation of COPD triggered by viral illness.  Continue ventilator support.  She had severe underlying disease prior to this event.  She has chronic hypoxic respiratory failure requiring 3 L of oxygen at baseline at home.  In addition it has surfaced that she has continued to smoke.  We will continue to support and reassess on a daily basis.  Her prognosis prior to this illness was guarded.  Limitations to weaning are related to agitation and asynchrony when agitated.  Sedatives have been readjusted.  We will start Klonopin via the OG twice a day to see if we can transition her from IV infusions.  2.  COPD  with chronic hypoxemic respiratory failure: She has acute exacerbation, continue steroids, bronchodilator treatments and Azithromycin.  3.  Acute on chronic kidney injury: Continue IV fluid repletion.. Fluids adjusted again today.  No indication for dialysis.  Continue to monitor renal function.  Monitor electrolytes.  4.  Type 2 diabetes mellitus: ICU hyperglycemia protocol.  Currently on sliding scale insulin.  5.  Depression: Continue Zoloft.  6.  Nutrition: Continue tube feeds.    LOS: 3 days   Additional comments: Multidisciplinary rounds were performed with the ICU team.    Family not at bedside today for discussion with regards to plan of care for today.  Critical Care Total Time*: 40 Minutes  C. Foye Spurling [place] [facility] 11/30/2018  *Care during the described time interval was provided by me and/or other providers on the critical care team.  I have reviewed this patient's available data, including medical history, events of note, physical examination and test results as part of my evaluation.

## 2018-12-01 LAB — CBC
HCT: 24.8 % — ABNORMAL LOW (ref 36.0–46.0)
Hemoglobin: 7.4 g/dL — ABNORMAL LOW (ref 12.0–15.0)
MCH: 30.1 pg (ref 26.0–34.0)
MCHC: 29.8 g/dL — ABNORMAL LOW (ref 30.0–36.0)
MCV: 100.8 fL — ABNORMAL HIGH (ref 80.0–100.0)
Platelets: 198 10*3/uL (ref 150–400)
RBC: 2.46 MIL/uL — ABNORMAL LOW (ref 3.87–5.11)
RDW: 15.2 % (ref 11.5–15.5)
WBC: 23.4 10*3/uL — ABNORMAL HIGH (ref 4.0–10.5)
nRBC: 0.1 % (ref 0.0–0.2)

## 2018-12-01 LAB — GLUCOSE, CAPILLARY
Glucose-Capillary: 133 mg/dL — ABNORMAL HIGH (ref 70–99)
Glucose-Capillary: 142 mg/dL — ABNORMAL HIGH (ref 70–99)
Glucose-Capillary: 149 mg/dL — ABNORMAL HIGH (ref 70–99)
Glucose-Capillary: 160 mg/dL — ABNORMAL HIGH (ref 70–99)
Glucose-Capillary: 198 mg/dL — ABNORMAL HIGH (ref 70–99)
Glucose-Capillary: 205 mg/dL — ABNORMAL HIGH (ref 70–99)

## 2018-12-01 LAB — BASIC METABOLIC PANEL
Anion gap: 6 (ref 5–15)
BUN: 59 mg/dL — ABNORMAL HIGH (ref 8–23)
CO2: 32 mmol/L (ref 22–32)
Calcium: 7.3 mg/dL — ABNORMAL LOW (ref 8.9–10.3)
Chloride: 101 mmol/L (ref 98–111)
Creatinine, Ser: 1.46 mg/dL — ABNORMAL HIGH (ref 0.44–1.00)
GFR calc Af Amer: 43 mL/min — ABNORMAL LOW (ref >60)
GFR calc non Af Amer: 37 mL/min — ABNORMAL LOW (ref >60)
Glucose, Bld: 210 mg/dL — ABNORMAL HIGH (ref 70–99)
Potassium: 4.4 mmol/L (ref 3.5–5.1)
Sodium: 139 mmol/L (ref 135–145)

## 2018-12-01 MED ORDER — VANCOMYCIN HCL 10 G IV SOLR
1500.0000 mg | INTRAVENOUS | Status: DC
  Administered 2018-12-02: 1500 mg via INTRAVENOUS
  Filled 2018-12-01 (×2): qty 1500

## 2018-12-01 MED ORDER — CHLORHEXIDINE GLUCONATE 0.12 % MT SOLN
OROMUCOSAL | Status: AC
  Filled 2018-12-01: qty 15

## 2018-12-01 MED ORDER — PRO-STAT SUGAR FREE PO LIQD
60.0000 mL | Freq: Two times a day (BID) | ORAL | Status: DC
  Administered 2018-12-01 – 2018-12-03 (×5): 60 mL via ORAL

## 2018-12-01 MED ORDER — VITAL HIGH PROTEIN PO LIQD
1000.0000 mL | ORAL | Status: DC
  Administered 2018-12-01: 1000 mL

## 2018-12-01 MED ORDER — VANCOMYCIN HCL 10 G IV SOLR
2000.0000 mg | Freq: Once | INTRAVENOUS | Status: AC
  Administered 2018-12-01: 2000 mg via INTRAVENOUS
  Filled 2018-12-01: qty 2000

## 2018-12-01 NOTE — Progress Notes (Signed)
[facility] [place] [facility]   PATIENT NAME: [name]    MR#:  [id number]  DATE OF BIRTH:  1951/07/08  SUBJECTIVE:   Patient came in with increasing shortness of breath and cough.  intubated  currently  IV precedex and fentanyl. On IV levophed  REVIEW OF SYSTEMS:   Review of Systems  Unable to perform ROS: Intubated   Tolerating Diet:TF  DRUG ALLERGIES:   Allergies  Allergen Reactions  . Percocet [Oxycodone-Acetaminophen] Nausea And Vomiting  . Chantix [Varenicline] Other (See Comments)    Severe headache and vomiting  . Fluoxetine Other (See Comments)    Headaches  . Lisinopril Cough  . Other     Other reaction(s): Unknown Contrast  . Verapamil Other (See Comments)    Lazy    VITALS:  Blood pressure (!) 96/52, pulse 84, temperature 98.4 F (36.9 C), resp. rate 14, height 5\' 2"  (1.575 m), weight 103.9 kg, SpO2 100 %.  PHYSICAL EXAMINATION:   Physical Exam  GENERAL:  68 y.o.-year-old patient lying in the bed with no acute distress. Morbidly obese critically ill  EYES: Pupils equal, round, reactive to light and accommodation. No scleral icterus.  HEENT: Head atraumatic, normocephalic. Oropharynx and nasopharynx clear. Intubated on the ventilator NECK:  Supple, no jugular venous distention. No thyroid enlargement, no tenderness.  LUNGS: Normal breath sounds bilaterally, no wheezing, rales, rhonchi. No use of accessory muscles of respiration.  CARDIOVASCULAR: S1, S2 normal. No murmurs, rubs, or gallops. Mild tachycardia ABDOMEN: Soft, nontender, nondistended. Bowel sounds present. No organomegaly or mass.  EXTREMITIES: No cyanosis, clubbing or edema b/l.    NEUROLOGIC: on the ventilator  PSYCHIATRIC:  patient is sedated  SKIN: No obvious rash, lesion, or ulcer.   LABORATORY PANEL:  CBC Recent Labs  Lab 12/01/18 0451  WBC 23.4*  HGB 7.4*  HCT 24.8*  PLT 198    Chemistries  Recent Labs  Lab 12-04-2018 1256  11/30/18 0359  12/01/18 0451  NA 141   < > 139 139  K 3.3*   < > 4.4 4.4  CL 87*   < > 100 101  CO2 45*   < > 31 32  GLUCOSE 211*   < > 219* 210*  BUN 21   < > 59* 59*  CREATININE 0.75   < > 1.66* 1.46*  CALCIUM 9.0   < > 7.2* 7.3*  MG  --    < > 2.3  --   AST 17  --   --   --   ALT 15  --   --   --   ALKPHOS 78  --   --   --   BILITOT 0.6  --   --   --    < > = values in this interval not displayed.   Cardiac Enzymes Recent Labs  Lab 12-04-18 1256  TROPONINI <0.03   RADIOLOGY:  Dg Chest Port 1 View  Result Date: 11/30/2018 CLINICAL DATA:  Acute respiratory failure EXAM: PORTABLE CHEST 1 VIEW COMPARISON:  11/29/2018 FINDINGS: Cardiac shadow is stable. Endotracheal tube and nasogastric catheter are noted in satisfactory position. Right jugular central line is again seen. Bibasilar atelectasis is seen. Hyperinflation is again identified. No focal confluent infiltrate is seen. IMPRESSION: Mild bibasilar atelectasis. Tubes and lines as described. Electronically Signed   By: Alcide Clever M.D.   On: 11/30/2018 07:07   ASSESSMENT AND PLAN:  [name]  is a 68 y.o. female with a known  history of COPD on oxygen presents with a fall.  She was in respiratory distress and was placed on BiPAP for elevated carbon dioxide on ABG.   1.  Acute on chronic hypercarbic respiratory failure.  - PCO2 of 98.   -was emergently intubated and on the ventilator. She failed BiPAP. -Continue IV Solu-Medrol and inhalers and nebulizer Pt will be a difficult wean  2.  COPD exacerbation  With possible pneumonia. -  Empiric antibiotics with\Zithromax.  - Solu-Medrol and nebulizer treatments.  3.  Acute encephalopathy likely secondary to high carbon dioxide.  - Hold gabapentin and Requip.    4.  Depression.  Continue Zoloft.  Decrease dose of Klonopin.  5.  Type 2 diabetes mellitus.  Put on sliding scale.  6.  Hyperlipidemia on Pravachol  Critically ill. Long-term poor prognosis. Palliative care spoke with fmaily.  Sister and dter to come over the weekend  CODE STATUS: DNR  DVT Prophylaxis: Lovenox  TOTAL TIME TAKING CARE OF THIS PATIENT: *30** minutes.  >50% time spent on counselling and coordination of care  POSSIBLE D/C IN **??* DAYS, DEPENDING ON CLINICAL CONDITION.  Note: This dictation was prepared with Dragon dictation along with smaller phrase technology. Any transcriptional errors that result from this process are unintentional.  Enedina Finner M.D on 12/01/2018 at 11:41 AM  Between 7am to 6pm - Pager - [phone]  After 6pm go to [email or web address] - Social research officer, government  [facility]  Office  [phone]  CC: Primary care physician; [name], DOPatient ID: [name], female   DOB: 1951/08/23, 68 y.o.   MRN: [id number]

## 2018-12-01 NOTE — Progress Notes (Signed)
Nutrition Follow-up  DOCUMENTATION CODES:   Obesity unspecified  INTERVENTION:  Initiate new goal TF regimen of Vital High Protein at 20 mL/hr (480 mL goal daily volume) + Pro-Stat 60 mL BID per tube. Provides 880 kcal, 102 grams of protein, 403 mL H2O daily. With current propofol rate provides 1500 kcal daily.  Continue liquid MVI daily per tube.  Continue minimum free water flush of 30 mL Q4hrs to maintain tube patency.  NUTRITION DIAGNOSIS:   Inadequate oral intake related to inability to eat as evidenced by NPO status.  Ongoing - addressing with TF regimen.  GOAL:   Provide needs based on ASPEN/SCCM guidelines  Met with TF regimen.  MONITOR:   Vent status, Labs, Weight trends, Skin, I & O's  REASON FOR ASSESSMENT:   Ventilator    ASSESSMENT:   68 yo female admitted with acute on chronic respiratory failure secondary to AECOPD and viral bronchitis requiring Bipap-failed biPAP, emergently intubated  Patient remains intubated and sedated. On PRVC mode with FiO2 50% and PEEP 8 cmH2O. Abdomen distended but soft. No BM yet this admission. Patient being followed by PMT. Per note from today plan is likely for compassionate extubation to comfort measures when family available and ready. Patient now on propofol gtt.  Enteral Access: OGT placed 3/2; terminates in stomach per chest x-ray 3/3; 55 cm at corner of mouth  MAP: 62-84 mmHg  TF: pt tolerating VHP @ 50 mL/hr  Patient is currently intubated on ventilator support Ve: 10.3 L/min Temp (24hrs), Avg:97.9 F (36.6 C), Min:97.7 F (36.5 C), Max:98.4 F (36.9 C)  Propofol: 23.5 mL/hr (620 kcal daily)  Medications reviewed and include: Novolog 0-9 units Q4hrs, Solu-Medrol 40 mg Q12hrs IV, liquid MVI daily per tube, Protonix, Mirralax, senna-docusate, NS @ 80 mL/hr, azithromycin, fentanyl gtt, norepinephrine gtt at 2 mcg/min, propofol gtt.  Labs reviewed: CBG 133-205, BUN 59, Creatinine 1.46.  I/O: 1305 mL UOP  yesterday (0.5 mL/kg/hr)  Weight trend: 103.9 kg on 3/5; +5.8 kg from 3/1  Discussed with RN.  Diet Order:   Diet Order            Diet NPO time specified  Diet effective now             EDUCATION NEEDS:   No education needs have been identified at this time  Skin:  Skin Assessment: Reviewed RN Assessment(scattered ecchymosis)  Last BM:  PTA  Height:   Ht Readings from Last 1 Encounters:  12/24/2018 '5\' 2"'  (1.575 m)   Weight:   Wt Readings from Last 1 Encounters:  12/01/18 103.9 kg   Ideal Body Weight:  50 kg  BMI:  Body mass index is 41.9 kg/m.  Estimated Nutritional Needs:   Kcal:  1079-1373kcal/day   Protein:  >100g/day   Fluid:  1.5L/day   Willey Blade, MS, RD, LDN Office: [phone] Pager: [phone] After Hours/Weekend Pager: [phone]

## 2018-12-01 NOTE — Progress Notes (Signed)
Pharmacy Antibiotic Note  [name] is a 68 y.o. female admitted on December 18, 2018 with pneumonia.  Pharmacy has been consulted for Vancomycin dosing.  Plan: Vancomycin 2 gm IV X 1 ordered to be given on 3/5 @ ~ 22:30. Vancomycin 1500 mg IV Q24H ordered to start on 3/6 @ 2300.  SCr = 1.46 CrCl = 30 ml/min Ke = 0.056 hr-1 T1/2 = 12.4 hrs Calc AUC = 515.6  C min = 11  No peak and trough currently ordered.   Height: 5\' 2"  (157.5 cm) Weight: 229 lb 0.9 oz (103.9 kg) IBW/kg (Calculated) : 50.1  Temp (24hrs), Avg:98.1 F (36.7 C), Min:97.7 F (36.5 C), Max:98.4 F (36.9 C)  Recent Labs  Lab 18-Dec-2018 1256 12-18-18 2058 11/28/18 0459 11/29/18 0535 11/30/18 0359 12/01/18 0451  WBC 22.8*  --  18.1*  --  15.1* 23.4*  CREATININE 0.75 0.91 1.05* 1.80* 1.66* 1.46*    Estimated Creatinine Clearance: 42.8 mL/min (A) (by C-G formula based on SCr of 1.46 mg/dL (H)).    Allergies  Allergen Reactions  . Percocet [Oxycodone-Acetaminophen] Nausea And Vomiting  . Chantix [Varenicline] Other (See Comments)    Severe headache and vomiting  . Fluoxetine Other (See Comments)    Headaches  . Lisinopril Cough  . Other     Other reaction(s): Unknown Contrast  . Verapamil Other (See Comments)    Lazy    Antimicrobials this admission:   >>    >>   Dose adjustments this admission:   Microbiology results:  BCx:   UCx:    Sputum:    MRSA PCR:   Thank you for allowing pharmacy to be a part of this patient's care.  Samvel Zinn D 12/01/2018 10:16 PM

## 2018-12-01 NOTE — Progress Notes (Addendum)
Daily Progress Note   Patient Name: [name]       Date: 12/01/2018 DOB: 1950-10-12  Age: 68 y.o. MRN#: [id number] Attending Physician: Fritzi Mandes, MD Primary Care Physician: Valerie Roys, DO Admit Date: 12/02/2018  Reason for Consultation/Follow-up: Establishing goals of care  Subjective: Patient remains intubated/sedated. Vitals signs stable. Off levo. No family at bedside.  Received call from patient's brother, Katherina Mires asking questions regarding diagnoses and prognosis. Discussed critical condition, diagnoses, interventions, chronic co-morbidities, and poor prognosis. Clair Gulling also agrees with sister Burman Nieves) and patient's daughter Benjamine Mola) that Reghan would NOT wish for prolonged heroic interventions if not showing improvement. I explained process of compassionate extubation and fear that this will likely be our only option with inability to wean her from ventilator. Clair Gulling understands and appreciates information. He is back in [place] and will not be present this weekend. He confirms that his sister, Burman Nieves, will be at the hospital tomorrow.   ADDENDUM 1345: Received call from daughter, Benjamine Mola. Provided patient update and again discussed plan of care including diagnoses, interventions, and poor prognosis. Benjamine Mola asks about a trach. Explained continued aggressive medical interventions including trach/peg/vent SNF placement with likely poor functional status and quality of life versus focus on comfort measures in order to allow peace and dignity at EOL. Benjamine Mola confirms that her mother would NOT wish prolonged heroic interventions including trach/feeding tube. Benjamine Mola may try to arrive by tomorrow, Friday but if not, will definitely be here Saturday. Answered questions and concerns.    ADDENDUM 1502: Also received call from sister, Brantley Stage. Provided patient update and again discussed plan of care including diagnoses, interventions and prognosis. Plan to meet with Maggie in person tomorrow, 3/6 afternoon.   Length of Stay: 4  Current Medications: Scheduled Meds:  . aspirin  81 mg Per Tube Daily  . chlorhexidine gluconate (MEDLINE KIT)  15 mL Mouth Rinse BID  . clonazePAM  0.5 mg Per Tube BID  . enoxaparin (LOVENOX) injection  40 mg Subcutaneous Q24H  . free water  30 mL Per Tube Q4H  . insulin aspart  0-9 Units Subcutaneous Q4H  . ipratropium-albuterol  3 mL Nebulization Q4H  . mouth rinse  15 mL Mouth Rinse 10 times per day  . methylPREDNISolone (SOLU-MEDROL) injection  40 mg Intravenous Q12H  . multivitamin  15 mL Per Tube Daily  . pantoprazole sodium  40 mg Per Tube Daily  . polyethylene glycol  17 g Per Tube Daily  . pravastatin  40 mg Per Tube QHS  . rOPINIRole  0.5 mg Per Tube BID  . senna-docusate  1 tablet Oral BID  . sertraline  100 mg Per Tube QHS  . sodium chloride flush  10-40 mL Intracatheter Q12H    Continuous Infusions: . sodium chloride 80 mL/hr at 12/01/18 0700  . azithromycin Stopped (11/30/18 1049)  . feeding supplement (VITAL HIGH PROTEIN) 50 mL/hr at 12/01/18 0400  . fentaNYL infusion INTRAVENOUS 200 mcg/hr (12/01/18 0758)  . norepinephrine (LEVOPHED) Adult infusion 2 mcg/min (12/01/18 0758)  . propofol (DIPRIVAN) infusion 40 mcg/kg/min (12/01/18 0756)    PRN Meds: acetaminophen **OR** acetaminophen, bisacodyl, fentaNYL, ipratropium-albuterol, midazolam, morphine injection, ondansetron **OR** ondansetron (ZOFRAN) IV, sodium chloride flush  Physical Exam Vitals signs and nursing note reviewed.  Constitutional:      Interventions: She is sedated and intubated.  HENT:     Head: Normocephalic and atraumatic.  Cardiovascular:     Rate and Rhythm: Normal rate.     Heart sounds: Normal heart sounds.  Pulmonary:     Effort: No  tachypnea, accessory muscle usage or respiratory distress. She is intubated.     Breath sounds: Decreased breath sounds present.  Abdominal:     Tenderness: There is no abdominal tenderness.  Skin:    General: Skin is warm and dry.     Coloration: Skin is pale.  Neurological:     Comments: Intubated/sedated           Vital Signs: BP (!) 102/53   Pulse 70   Temp 97.9 F (36.6 C) (Oral)   Resp 14   Ht '5\' 2"'  (1.575 m)   Wt 103.9 kg   SpO2 97%   BMI 41.90 kg/m  SpO2: SpO2: 97 % O2 Device: O2 Device: Ventilator O2 Flow Rate: O2 Flow Rate (L/min): 3 L/min  Intake/output summary:   Intake/Output Summary (Last 24 hours) at 12/01/2018 8588 Last data filed at 12/01/2018 0700 Gross per 24 hour  Intake 5370.56 ml  Output 1305 ml  Net 4065.56 ml   LBM: Last BM Date: (PTA) Baseline Weight: Weight: 104.3 kg Most recent weight: Weight: 103.9 kg       Palliative Assessment/Data: PPS 10%   Flowsheet Rows     Most Recent Value  Intake Tab  Referral Department  Critical care  Unit at Time of Referral  ICU  Palliative Care Primary Diagnosis  Pulmonary  Date Notified  11/29/18  Palliative Care Type  New Palliative care  Reason for referral  Clarify Goals of Care, End of Life Care Assistance  Date of Admission  12/16/2018  Date first seen by Palliative Care  11/30/18  # of days IP prior to Palliative referral  2  Clinical Assessment  Palliative Performance Scale Score  10%  Psychosocial & Spiritual Assessment  Palliative Care Outcomes  Patient/Family meeting held?  Yes  Who was at the meeting?  spoke with daughter and then sister via telephone  Palliative Care Outcomes  Clarified goals of care, Provided end of life care assistance, Provided psychosocial or spiritual support, ACP counseling assistance, Counseled regarding hospice      Patient Active Problem List   Diagnosis Date Noted  . Palliative care by specialist   . Goals of care, counseling/discussion   . Endotracheal  tube present   . Agitation   .  Acute respiratory failure with hypercapnia ([place]) 12/16/2018  . COPD exacerbation ([place])   . DNR (do not resuscitate) 08/05/2018  . Ventral hernia 11/22/2017  . Restless legs syndrome (RLS) 06/11/2015  . Type 2 diabetes, diet controlled ([place]) 03/22/2015  . Anxiety   . Hyperlipidemia   . Benign hypertensive renal disease   . Chronic kidney disease   . COPD, severe ([place]) 02/27/2014    Palliative Care Assessment & Plan   Patient Profile: 68 y.o. female  with past medical history of COPD on home oxygen 3L [place], smoker, obesity, HTN, HLD, restless leg, DM, depression, anxiety, CKD admitted on 12/06/2018 with respiratory distress and fall. Placed on BiPAP in ED but failed BiPAP and required intubation/mechanical ventilation with ICU admission. Patient with acute on chronic hypercarbic respiratory failure (PCO2 of 98) due to COPD exacerbation and viral bronchitis. Remains intubated and sedated on ventilator. Palliative medicine consultation for goals of care.   Assessment: Acute on chronic hypercarbic respiratory failure COPD exacerbation Viral bronchitis AKI with CKD Acute encephalopathy Agitation  Recommendations/Plan:  DNR in event of cardiac arrest. Otherwise continue current plan of care and watchful waiting until family arrives this weekend.  PMT provider has spoken with daughter, sister, and brother via telephone who are all in agreement that patient would NOT want prolonged heroic interventions including trach/peg placement.   Patient remains critically ill unable to wean on ventilator. Likely compassionate extubation to comfort measures when family available and ready. Daughter will not be at [facility] until Saturday.   Code Status: DNR   Code Status Orders  (From admission, onward)         Start     Ordered   11/29/18 1220  Do not attempt resuscitation (DNR)  Continuous    Question Answer Comment  In the event of cardiac or respiratory ARREST Do  not call a "code blue"   In the event of cardiac or respiratory ARREST Do not perform Intubation, CPR, defibrillation or ACLS   In the event of cardiac or respiratory ARREST Use medication by any route, position, wound care, and other measures to relive pain and suffering. May use oxygen, suction and manual treatment of airway obstruction as needed for comfort.      11/29/18 1225        Code Status History    Date Active Date Inactive Code Status Order ID Comments User Context   12/08/2018 1962 11/29/2018 1225 Full Code [id number]  Loletha Grayer, MD ED       Prognosis:   Poor prognosis: Acute on chronic respiratory failure, COPD exacerbation, viral bronchitis, AKI on CKD, and severe agitation on ventilator making it difficult to wean.  Discharge Planning:  To Be Determined  Care plan was discussed with multidisciplinary rounds, brother, RN  Thank you for allowing the Palliative Medicine Team to assist in the care of this patient.   Time In: 0930- 1205- Time Out: 0940 1225 Total Time 35 Prolonged Time Billed no      Greater than 50%  of this time was spent counseling and coordinating care related to the above assessment and plan.  Ihor Dow, FNP-C Palliative Medicine Team  Phone: [phone] Fax: [phone]  Please contact Palliative Medicine Team phone at [phone] for questions and concerns.

## 2018-12-01 NOTE — Progress Notes (Signed)
Follow up - Critical Care Medicine Note  Patient Details:    [name] [name] is an 68 y.o. female. 68 yo female, current smoker, admitted with acute on chronic respiratory failure secondary to AECOPD and viral bronchitis requiring Bipap.  Failed BiPAP and required intubation/mechanical ventilation.   Previously DNR.  Lines, Airways, Drains: Airway 7.5 mm (Active)  Secured at (cm) 25 cm 11/29/2018  8:00 PM  Measured From Lips 11/29/2018  8:00 PM  Secured Location Right 11/29/2018  7:15 PM  Secured By [employer] Tube Holder 11/29/2018  7:15 PM  Tube Holder Repositioned Yes 11/29/2018  7:15 PM  Cuff Pressure (cm H2O) 26 cm H2O 11/29/2018  7:15 PM  Site Condition Dry 11/29/2018  7:15 PM     CVC Triple Lumen 11/28/18 Right Internal jugular (Active)  Indication for Insertion or Continuance of Line Vasoactive infusions 11/29/2018  8:00 PM  Site Assessment Bleeding;Other (Comment) 11/29/2018  8:00 AM  Proximal Lumen Status Infusing;Flushed;Blood return noted 11/29/2018  8:00 AM  Medial Lumen Status Infusing;Flushed;Blood return noted 11/29/2018  8:00 AM  Distal Lumen Status Infusing;Flushed;Blood return noted 11/29/2018  8:00 AM  Dressing Type Occlusive;Gauze;Pressure 11/29/2018  8:00 AM  Dressing Status New drainage;Old drainage;Intact 11/29/2018  8:00 AM  Line Care Connections checked and tightened 11/29/2018  8:00 AM  Dressing Intervention Dressing reinforced 11/29/2018  8:00 AM  Dressing Change Due 12/05/18 11/29/2018  8:00 AM     NG/OG Tube Orogastric Center mouth Xray Documented cm marking at nare/ corner of mouth 55 cm (Active)  Cm Marking at Nare/Corner of Mouth (if applicable) 55 cm 11/29/2018  4:00 PM  External Length of Tube (cm) - (if applicable) 55 cm 11/28/2018  7:20 PM  Site Assessment Clean;Dry;Intact 11/29/2018  4:00 PM  Ongoing Placement Verification No change in cm markings or external length of tube from initial placement;No change in respiratory status;No acute changes, not attributed to clinical condition  11/29/2018  4:00 PM  Status Infusing tube feed 11/29/2018  4:00 PM  Intake (mL) 100 mL 11/28/2018 12:00 PM  Output (mL) 55 mL 11/29/2018  5:00 AM     Urethral Catheter BLRC, RN Non-latex (Active)  Indication for Insertion or Continuance of Catheter Unstable critically ill patients first 24-48 hours (See Criteria) 11/29/2018  4:00 PM  Site Assessment Clean;Intact;Dry 11/29/2018  4:00 PM  Catheter Maintenance Bag below level of bladder;No dependent loops;Catheter secured;Drainage bag/tubing not touching floor;Seal intact;Insertion date on drainage bag 11/29/2018  8:00 PM  Collection Container Standard drainage bag 11/29/2018  4:00 PM  Securement Method Securing device (Describe) 11/29/2018  4:00 PM  Urinary Catheter Interventions Unclamped 11/29/2018  4:00 PM  Output (mL) 110 mL 11/29/2018  8:00 PM    Anti-infectives:  Anti-infectives (From admission, onward)   Start     Dose/Rate Route Frequency Ordered Stop   11/28/18 2200  levofloxacin (LEVAQUIN) IVPB 500 mg  Status:  Discontinued     500 mg 100 mL/hr over 60 Minutes Intravenous Every 24 hours 11/28/18 0728 11/28/18 1014   11/28/18 1800  azithromycin (ZITHROMAX) 500 mg in sodium chloride 0.9 % 250 mL IVPB  Status:  Discontinued     500 mg 250 mL/hr over 60 Minutes Intravenous Daily-1800 11/28/18 1014 11/28/18 1015   11/28/18 1030  azithromycin (ZITHROMAX) 500 mg in sodium chloride 0.9 % 250 mL IVPB     500 mg 250 mL/hr over 60 Minutes Intravenous Daily 11/28/18 1015     25-Sep-2019 1515  cefTRIAXone (ROCEPHIN) 2 g in sodium chloride 0.9 %  100 mL IVPB  Status:  Discontinued     2 g 200 mL/hr over 30 Minutes Intravenous Every 24 hours 12/04/2018 1508 12/22/2018 1801   12/04/2018 1515  azithromycin (ZITHROMAX) 500 mg in sodium chloride 0.9 % 250 mL IVPB  Status:  Discontinued     500 mg 250 mL/hr over 60 Minutes Intravenous Every 24 hours 12/21/2018 1508 11/28/18 0717   12/07/2018 1430  ceFEPIme (MAXIPIME) 1 g in sodium chloride 0.9 % 100 mL IVPB  Status:  Discontinued      1 g 200 mL/hr over 30 Minutes Intravenous  Once 12/26/2018 1424 12/04/2018 1508      Microbiology: Results for orders placed or performed during the hospital encounter of 11/30/2018  Urine culture     Status: None   Collection Time: 12/24/2018  2:57 PM  Result Value Ref Range Status   Specimen Description   Final    URINE, RANDOM Performed at [facility], [address], [place], [place] [address]    Special Requests   Final    NONE Performed at [facility], [address] [place], [place] [address]    Culture   Final    NO GROWTH Performed at [facility], [address] [place], [place] [address]    Report Status 11/28/2018 FINAL  Final  Culture, blood (routine x 2)     Status: None (Preliminary result)   Collection Time: 12/22/2018  3:01 PM  Result Value Ref Range Status   Specimen Description BLOOD RAC  Final   Special Requests   Final    BOTTLES DRAWN AEROBIC AND ANAEROBIC Blood Culture adequate volume   Culture   Final    NO GROWTH 4 DAYS Performed at [facility], [address] [place], [place] [address]    Report Status PENDING  Incomplete  Culture, blood (routine x 2)     Status: None (Preliminary result)   Collection Time: 12/17/2018  4:06 PM  Result Value Ref Range Status   Specimen Description BLOOD BLOOD RIGHT HAND  Final   Special Requests   Final    BOTTLES DRAWN AEROBIC AND ANAEROBIC Blood Culture results may not be optimal due to an excessive volume of blood received in culture bottles   Culture   Final    NO GROWTH 4 DAYS Performed at [facility], [address] [place], [place] [address]    Report Status PENDING  Incomplete  Respiratory Panel by PCR     Status: None   Collection Time: 12/11/2018  5:21 PM  Result Value Ref Range Status   Adenovirus NOT DETECTED NOT DETECTED Final   Coronavirus 229E NOT DETECTED NOT DETECTED Final    Comment: (NOTE) The Coronavirus on the Respiratory Panel, DOES NOT test  for the novel  Coronavirus (2019 nCoV)    Coronavirus HKU1 NOT DETECTED NOT DETECTED Final   Coronavirus NL63 NOT DETECTED NOT DETECTED Final   Coronavirus OC43 NOT DETECTED NOT DETECTED Final   Metapneumovirus NOT DETECTED NOT DETECTED Final   Rhinovirus / Enterovirus NOT DETECTED NOT DETECTED Final   Influenza A NOT DETECTED NOT DETECTED Final   Influenza B NOT DETECTED NOT DETECTED Final   Parainfluenza Virus 1 NOT DETECTED NOT DETECTED Final   Parainfluenza Virus 2 NOT DETECTED NOT DETECTED Final   Parainfluenza Virus 3 NOT DETECTED NOT DETECTED Final   Parainfluenza Virus 4 NOT DETECTED NOT DETECTED Final   Respiratory Syncytial Virus NOT DETECTED NOT DETECTED Final  Bordetella pertussis NOT DETECTED NOT DETECTED Final   Chlamydophila pneumoniae NOT DETECTED NOT DETECTED Final   Mycoplasma pneumoniae NOT DETECTED NOT DETECTED Final  MRSA PCR Screening     Status: None   Collection Time: 12/14/2018  5:21 PM  Result Value Ref Range Status   MRSA by PCR NEGATIVE NEGATIVE Final    Comment:        The GeneXpert MRSA Assay (FDA approved for NASAL specimens only), is one component of a comprehensive MRSA colonization surveillance program. It is not intended to diagnose MRSA infection nor to guide or monitor treatment for MRSA infections. Performed at [facility], [address] [place], [place] [address]   Culture, respiratory (non-expectorated)     Status: None   Collection Time: 11/28/18 12:00 PM  Result Value Ref Range Status   Specimen Description   Final    TRACHEAL ASPIRATE Performed at [facility], [address] [place], [place] [address]    Special Requests   Final    NONE Performed at [facility], [address] [place], [place] [address]    Gram Stain   Final    ABUNDANT WBC PRESENT, PREDOMINANTLY MONONUCLEAR ABUNDANT GRAM POSITIVE COCCI    Culture   Final    Consistent with normal respiratory flora. Performed at [facility], [address], [place], [place] [address]    Report Status 11/30/2018 FINAL  Final  Culture, respiratory (non-expectorated)     Status: None (Preliminary result)   Collection Time: 12/01/18  1:15 PM  Result Value Ref Range Status   Specimen Description   Final    TRACHEAL ASPIRATE Performed at [facility], [address] [place], [place] [address]    Special Requests   Final    NONE Performed at [facility], [address] [place], [place] [address]    Gram Stain   Final    ABUNDANT WBC PRESENT,BOTH PMN AND MONONUCLEAR ABUNDANT GRAM POSITIVE COCCI Performed at [facility], [address], [place], [place] [address]    Culture PENDING  Incomplete   Report Status PENDING  Incomplete    Best Practice/Protocols:  VTE Prophylaxis: Lovenox (prophylaxtic dose) GI Prophylaxis: Proton Pump Inhibitor Continous Sedation Precedex d/cd Propofol+ Fentanyl  Events: Intubated, mechanically ventilated 3/2  Studies: Dg Abd 1 View  Result Date: 11/28/2018 CLINICAL DATA:  OG tube placement. EXAM: ABDOMEN - 1 VIEW COMPARISON:  None. FINDINGS: Tip of the enteric tube below the diaphragm in the stomach, the side port is not well visualized but likely just beyond the gastroesophageal junction. No evidence of free air in the upper abdomen. Cholecystectomy clips noted. IMPRESSION: Tip of the enteric tube below the diaphragm in the stomach. The side-port is not well visualized but likely just beyond the gastroesophageal junction. Electronically Signed   By: Narda Rutherford M.D.   On: 11/28/2018 01:34   Ct Head Wo Contrast  Result Date: 11/30/2018 CLINICAL DATA:  Status post fall, altered mental status EXAM: CT HEAD WITHOUT CONTRAST CT CERVICAL SPINE WITHOUT CONTRAST TECHNIQUE: Multidetector CT imaging of the head and cervical spine was performed following the standard protocol without intravenous contrast. Multiplanar CT image reconstructions of the cervical  spine were also generated. COMPARISON:  None. FINDINGS: CT HEAD FINDINGS Brain: No evidence of acute infarction, hemorrhage, hydrocephalus, extra-axial collection or mass lesion/mass effect. Vascular: No hyperdense vessel or unexpected calcification. Skull: No acute calvarial injury.  Bilateral parietal burr holes. Sinuses/Orbits: Osteoma in the  left posterior ethmoid sinus. Remainder the paranasal sinuses are clear. Visualized mastoid sinuses are clear. Visualized orbits demonstrate no focal abnormality. Other: None CT CERVICAL SPINE FINDINGS Alignment: 1-2 mm anterolisthesis of C3 on C4. Skull base and vertebrae: No acute fracture. No primary bone lesion or focal pathologic process. Soft tissues and spinal canal: No prevertebral fluid or swelling. No visible canal hematoma. Disc levels: Degenerative disc disease with disc height loss at C4-5, C5-6 and C6-7. Moderate right facet arthropathy at C2-3. Bilateral uncovertebral degenerative changes at C5-6 with mild foraminal narrowing. Upper chest: Lung apices are clear. Other: No fluid collection or hematoma. IMPRESSION: 1. No acute intracranial pathology. 2.  No acute osseous injury of the cervical spine. Electronically Signed   By: Elige Ko   On: 2018-12-09 15:02   Ct Cervical Spine Wo Contrast  Result Date: 2018-12-09 CLINICAL DATA:  Status post fall, altered mental status EXAM: CT HEAD WITHOUT CONTRAST CT CERVICAL SPINE WITHOUT CONTRAST TECHNIQUE: Multidetector CT imaging of the head and cervical spine was performed following the standard protocol without intravenous contrast. Multiplanar CT image reconstructions of the cervical spine were also generated. COMPARISON:  None. FINDINGS: CT HEAD FINDINGS Brain: No evidence of acute infarction, hemorrhage, hydrocephalus, extra-axial collection or mass lesion/mass effect. Vascular: No hyperdense vessel or unexpected calcification. Skull: No acute calvarial injury.  Bilateral parietal burr holes. Sinuses/Orbits:  Osteoma in the left posterior ethmoid sinus. Remainder the paranasal sinuses are clear. Visualized mastoid sinuses are clear. Visualized orbits demonstrate no focal abnormality. Other: None CT CERVICAL SPINE FINDINGS Alignment: 1-2 mm anterolisthesis of C3 on C4. Skull base and vertebrae: No acute fracture. No primary bone lesion or focal pathologic process. Soft tissues and spinal canal: No prevertebral fluid or swelling. No visible canal hematoma. Disc levels: Degenerative disc disease with disc height loss at C4-5, C5-6 and C6-7. Moderate right facet arthropathy at C2-3. Bilateral uncovertebral degenerative changes at C5-6 with mild foraminal narrowing. Upper chest: Lung apices are clear. Other: No fluid collection or hematoma. IMPRESSION: 1. No acute intracranial pathology. 2.  No acute osseous injury of the cervical spine. Electronically Signed   By: Elige Ko   On: 2018-12-09 15:02   Dg Chest Port 1 View  Result Date: 11/30/2018 CLINICAL DATA:  Acute respiratory failure EXAM: PORTABLE CHEST 1 VIEW COMPARISON:  11/29/2018 FINDINGS: Cardiac shadow is stable. Endotracheal tube and nasogastric catheter are noted in satisfactory position. Right jugular central line is again seen. Bibasilar atelectasis is seen. Hyperinflation is again identified. No focal confluent infiltrate is seen. IMPRESSION: Mild bibasilar atelectasis. Tubes and lines as described. Electronically Signed   By: Alcide Clever M.D.   On: 11/30/2018 07:07   Portable Chest Xray  Result Date: 11/29/2018 CLINICAL DATA:  Intubation EXAM: PORTABLE CHEST 1 VIEW COMPARISON:  Yesterday FINDINGS: Endotracheal tube tip between the clavicular heads and carina. The orogastric tube at least reaches the stomach. Right IJ line with tip at the SVC. Large lung volumes with interstitial coarsening at the bases. Normal heart size and mediastinal contours. No effusion or pneumothorax. IMPRESSION: 1. Stable and unremarkable hardware positioning. 2. COPD with  stable aeration. Electronically Signed   By: Marnee Spring M.D.   On: 11/29/2018 06:18   Dg Chest Port 1 View  Result Date: 11/28/2018 CLINICAL DATA:  Line placement EXAM: PORTABLE CHEST 1 VIEW COMPARISON:  11/28/2018, 2020-02-2611 FINDINGS: Endotracheal tube tip is about 2 cm superior to the carina. Right-sided central venous catheter tip over the SVC. No pneumothorax. Hyperinflation with mild  bronchitic changes at the bases. Emphysematous disease. Stable slightly enlarged cardiomediastinal silhouette. IMPRESSION: 1. Endotracheal tube tip about 2 cm superior to carina. 2. Right-sided central venous catheter tip over the SVC. No pneumothorax 3. Hyperinflation.  Stable interstitial opacities at the bases. Electronically Signed   By: Jasmine Pang M.D.   On: 11/28/2018 02:38   Portable Chest X-ray  Result Date: 11/28/2018 CLINICAL DATA:  Endotracheal tube placement. EXAM: PORTABLE CHEST 1 VIEW COMPARISON:  Radiographs yesterday. FINDINGS: Endotracheal tube tip 3.6 cm from the carina. Enteric tube in place, tip below the diaphragm, better assessed on concurrent abdominal radiographs. Mild cardiomegaly. Hyperinflation with chronic interstitial coarsening. No acute airspace disease, pneumothorax or large pleural effusion. IMPRESSION: 1. Endotracheal tube tip 3.6 cm from the carina. Enteric tube in place, tip below the diaphragm. 2. Mild cardiomegaly and chronic hyperinflation. Electronically Signed   By: Narda Rutherford M.D.   On: 11/28/2018 01:32   Dg Chest Portable 1 View  Result Date: 2018/12/23 CLINICAL DATA:  Acute shortness of breath EXAM: PORTABLE CHEST 1 VIEW COMPARISON:  03/02/2018 and prior chest radiographs FINDINGS: Cardiomegaly again noted. Probable COPD noted. Mild chronic interstitial opacities in the LOWER lungs again noted. There is no evidence of focal airspace disease, pulmonary edema, suspicious pulmonary nodule/mass, pleural effusion, or pneumothorax. No acute bony abnormalities are  identified. IMPRESSION: No evidence of acute cardiopulmonary disease. Cardiomegaly, COPD type changes and mild chronic LOWER lung interstitial opacities. Electronically Signed   By: Harmon Pier M.D.   On: 12/23/2018 13:32    Consults: Treatment Team:  Erin Fulling, MD   Subjective:    Overnight Issues: Sedated mechanically ventilated.  Agitated when wake-up assessment is attempted.  Not synchronous with the ventilator when sedation is lightened.  Poor respiratory mechanics due to underlying COPD.  Copious secretions from ET tube  Objective:  Vital signs for last 24 hours: Temp:  [97.7 F (36.5 C)-98.4 F (36.9 C)] 98.4 F (36.9 C) (03/05 1600) Pulse Rate:  [67-103] 97 (03/05 2100) Resp:  [12-27] 18 (03/05 2100) BP: (94-120)/(46-66) 110/49 (03/05 2100) SpO2:  [80 %-100 %] 95 % (03/05 2100) FiO2 (%):  [35 %-50 %] 50 % (03/05 1943) Weight:  [103.9 kg] 103.9 kg (03/05 0447)  Hemodynamic parameters for last 24 hours:    Intake/Output from previous day: 03/04 0701 - 03/05 0700 In: 5400.6 [I.V.:3730.6; NG/GT:1430; IV Piggyback:240] Out: 1305 [Urine:1305]  Intake/Output this shift: Total I/O In: 987.9 [I.V.:507.9; NG/GT:230; IV Piggyback:250] Out: 100 [Urine:100]  Vent settings for last 24 hours: Vent Mode: PRVC FiO2 (%):  [35 %-50 %] 50 % Set Rate:  [14 bmp] 14 bmp Vt Set:  [400 mL] 400 mL PEEP:  [8 cmH20] 8 cmH20 Plateau Pressure:  [27 cmH20] 27 cmH20  Physical Exam:  General:   obese female, intubated/sedated/mechanically ventilated.  Synchronous with the ventilator only and deeply sedated.   Neuro:  Sedated.  HEENT: PERRL. No icterus Cardiovascular: nsr, rrr, no R/G Lungs: diminished throughout, coarse, no wheezes, I:E 1:3, copious secretions from ET tube Abdomen: +BS x4, soft, obese, non distended Musculoskeletal:  Developing anasarca +2, no clubbing  Skin: intact no lesions present   Assessment/Plan:  1.  Acute on chronic mixed respiratory failure with ventilator  dependence due to acute exacerbation of COPD triggered by viral illness.  Continue ventilator support.  She had severe underlying disease prior to this event.  She has chronic hypoxic respiratory failure requiring 3 L of oxygen at baseline at home.  In addition it has surfaced  that she has continued to smoke. Her prognosis prior to this illness was guarded.  Limitations to weaning are related to agitation and asynchrony when agitated.  She continues to be on propofol and fentanyl infusions.  She is also on Klonopin via the OG twice a day to see if we can transition her from IV infusions.  2.  COPD with chronic hypoxemic respiratory failure: She has acute exacerbation, continue steroids, bronchodilator treatments and Azithromycin.  Copious secretions from the ET tube, culture sputum.  3.  Acute on chronic kidney injury: Continue IV fluid repletion.. Fluids adjusted again today due to developing anasarca.  No indication for dialysis.  Continue to monitor renal function.  Monitor electrolytes.  4.  Type 2 diabetes mellitus: ICU hyperglycemia protocol.  Currently on sliding scale insulin.  5.  Depression: Continue Zoloft.  6.  Nutrition: Continue tube feeds.    LOS: 4 days   Additional comments: Multidisciplinary rounds were performed with the ICU team.    Family not at bedside today for discussion with regards to plan of care for today.  Critical Care Total Time*: 40 Minutes  C. Foye Spurling [facility] 12/01/2018  *Care during the described time interval was provided by me and/or other providers on the critical care team.  I have reviewed this patient's available data, including medical history, events of note, physical examination and test results as part of my evaluation.

## 2018-12-01 NOTE — Progress Notes (Signed)
Uneventful day. Pallative care talked with out of town family. Local sister and adopted daughter in to visit. Aware of out of town sisters POA status.

## 2018-12-02 LAB — GLUCOSE, CAPILLARY
Glucose-Capillary: 152 mg/dL — ABNORMAL HIGH (ref 70–99)
Glucose-Capillary: 174 mg/dL — ABNORMAL HIGH (ref 70–99)
Glucose-Capillary: 178 mg/dL — ABNORMAL HIGH (ref 70–99)
Glucose-Capillary: 178 mg/dL — ABNORMAL HIGH (ref 70–99)
Glucose-Capillary: 182 mg/dL — ABNORMAL HIGH (ref 70–99)
Glucose-Capillary: 185 mg/dL — ABNORMAL HIGH (ref 70–99)
Glucose-Capillary: 207 mg/dL — ABNORMAL HIGH (ref 70–99)

## 2018-12-02 LAB — CULTURE, BLOOD (ROUTINE X 2)
Culture: NO GROWTH
Culture: NO GROWTH
Special Requests: ADEQUATE

## 2018-12-02 LAB — BASIC METABOLIC PANEL
Anion gap: 7 (ref 5–15)
BUN: 72 mg/dL — ABNORMAL HIGH (ref 8–23)
CO2: 30 mmol/L (ref 22–32)
Calcium: 7.5 mg/dL — ABNORMAL LOW (ref 8.9–10.3)
Chloride: 104 mmol/L (ref 98–111)
Creatinine, Ser: 1.37 mg/dL — ABNORMAL HIGH (ref 0.44–1.00)
GFR calc Af Amer: 46 mL/min — ABNORMAL LOW (ref >60)
GFR calc non Af Amer: 40 mL/min — ABNORMAL LOW (ref >60)
Glucose, Bld: 207 mg/dL — ABNORMAL HIGH (ref 70–99)
Potassium: 5.1 mmol/L (ref 3.5–5.1)
Sodium: 141 mmol/L (ref 135–145)

## 2018-12-02 LAB — CBC
HCT: 27.5 % — ABNORMAL LOW (ref 36.0–46.0)
Hemoglobin: 7.7 g/dL — ABNORMAL LOW (ref 12.0–15.0)
MCH: 29.4 pg (ref 26.0–34.0)
MCHC: 28 g/dL — ABNORMAL LOW (ref 30.0–36.0)
MCV: 105 fL — ABNORMAL HIGH (ref 80.0–100.0)
Platelets: 182 10*3/uL (ref 150–400)
RBC: 2.62 MIL/uL — ABNORMAL LOW (ref 3.87–5.11)
RDW: 15.6 % — ABNORMAL HIGH (ref 11.5–15.5)
WBC: 27.2 10*3/uL — ABNORMAL HIGH (ref 4.0–10.5)
nRBC: 0.1 % (ref 0.0–0.2)

## 2018-12-02 LAB — ALBUMIN: Albumin: 2.6 g/dL — ABNORMAL LOW (ref 3.5–5.0)

## 2018-12-02 MED ORDER — METHYLPREDNISOLONE SODIUM SUCC 40 MG IJ SOLR
20.0000 mg | Freq: Two times a day (BID) | INTRAMUSCULAR | Status: DC
  Administered 2018-12-02 – 2018-12-03 (×2): 20 mg via INTRAVENOUS
  Filled 2018-12-02 (×2): qty 1

## 2018-12-02 MED ORDER — SENNOSIDES-DOCUSATE SODIUM 8.6-50 MG PO TABS
2.0000 | ORAL_TABLET | Freq: Two times a day (BID) | ORAL | Status: DC
  Administered 2018-12-02 – 2018-12-03 (×2): 2 via ORAL
  Filled 2018-12-02 (×2): qty 2

## 2018-12-02 NOTE — Progress Notes (Signed)
Follow up - Critical Care Medicine Note  Patient Details:    [name] is an 68 y.o. female. 69 yo female, current smoker, admitted with acute on chronic respiratory failure secondary to AECOPD and viral bronchitis requiring Bipap.  Failed BiPAP and required intubation/mechanical ventilation.   Previously DNR.  Lines, Airways, Drains: Airway 7.5 mm (Active)  Secured at (cm) 25 cm 11/29/2018  8:00 PM  Measured From Lips 11/29/2018  8:00 PM  Secured Location Right 11/29/2018  7:15 PM  Secured By [employer] 11/29/2018  7:15 PM  Tube Holder Repositioned Yes 11/29/2018  7:15 PM  Cuff Pressure (cm H2O) 26 cm H2O 11/29/2018  7:15 PM  Site Condition Dry 11/29/2018  7:15 PM     CVC Triple Lumen 11/28/18 Right Internal jugular (Active)  Indication for Insertion or Continuance of Line Vasoactive infusions 11/29/2018  8:00 PM  Site Assessment Bleeding;Other (Comment) 11/29/2018  8:00 AM  Proximal Lumen Status Infusing;Flushed;Blood return noted 11/29/2018  8:00 AM  Medial Lumen Status Infusing;Flushed;Blood return noted 11/29/2018  8:00 AM  Distal Lumen Status Infusing;Flushed;Blood return noted 11/29/2018  8:00 AM  Dressing Type Occlusive;Gauze;Pressure 11/29/2018  8:00 AM  Dressing Status New drainage;Old drainage;Intact 11/29/2018  8:00 AM  Line Care Connections checked and tightened 11/29/2018  8:00 AM  Dressing Intervention Dressing reinforced 11/29/2018  8:00 AM  Dressing Change Due 12/05/18 11/29/2018  8:00 AM     NG/OG Tube Orogastric Center mouth Xray Documented cm marking at nare/ corner of mouth 55 cm (Active)  Cm Marking at Nare/Corner of Mouth (if applicable) 55 cm 11/29/2018  4:00 PM  External Length of Tube (cm) - (if applicable) 55 cm 11/28/2018  7:20 PM  Site Assessment Clean;Dry;Intact 11/29/2018  4:00 PM  Ongoing Placement Verification No change in cm markings or external length of tube from initial placement;No change in respiratory status;No acute changes, not attributed to clinical condition  11/29/2018  4:00 PM  Status Infusing tube feed 11/29/2018  4:00 PM  Intake (mL) 100 mL 11/28/2018 12:00 PM  Output (mL) 55 mL 11/29/2018  5:00 AM     Urethral Catheter BLRC, RN Non-latex (Active)  Indication for Insertion or Continuance of Catheter Unstable critically ill patients first 24-48 hours (See Criteria) 11/29/2018  4:00 PM  Site Assessment Clean;Intact;Dry 11/29/2018  4:00 PM  Catheter Maintenance Bag below level of bladder;No dependent loops;Catheter secured;Drainage bag/tubing not touching floor;Seal intact;Insertion date on drainage bag 11/29/2018  8:00 PM  Collection Container Standard drainage bag 11/29/2018  4:00 PM  Securement Method Securing device (Describe) 11/29/2018  4:00 PM  Urinary Catheter Interventions Unclamped 11/29/2018  4:00 PM  Output (mL) 110 mL 11/29/2018  8:00 PM    Anti-infectives:  Anti-infectives (From admission, onward)   Start     Dose/Rate Route Frequency Ordered Stop   12/02/18 2300  vancomycin (VANCOCIN) 1,500 mg in sodium chloride 0.9 % 500 mL IVPB     1,500 mg 250 mL/hr over 120 Minutes Intravenous Every 24 hours 12/01/18 2215     12/01/18 2215  vancomycin (VANCOCIN) 2,000 mg in sodium chloride 0.9 % 500 mL IVPB     2,000 mg 250 mL/hr over 120 Minutes Intravenous  Once 12/01/18 2210 12/02/18 0046   11/28/18 2200  levofloxacin (LEVAQUIN) IVPB 500 mg  Status:  Discontinued     500 mg 100 mL/hr over 60 Minutes Intravenous Every 24 hours 11/28/18 0728 11/28/18 1014   11/28/18 1800  azithromycin (ZITHROMAX) 500 mg in sodium chloride 0.9 % 250  mL IVPB  Status:  Discontinued     500 mg 250 mL/hr over 60 Minutes Intravenous Daily-1800 11/28/18 1014 11/28/18 1015   11/28/18 1030  azithromycin (ZITHROMAX) 500 mg in sodium chloride 0.9 % 250 mL IVPB  Status:  Discontinued     500 mg 250 mL/hr over 60 Minutes Intravenous Daily 11/28/18 1015 12/02/18 1020   12-11-18 1515  cefTRIAXone (ROCEPHIN) 2 g in sodium chloride 0.9 % 100 mL IVPB  Status:  Discontinued     2 g 200  mL/hr over 30 Minutes Intravenous Every 24 hours 12/11/2018 1508 2018/12/11 1801   December 11, 2018 1515  azithromycin (ZITHROMAX) 500 mg in sodium chloride 0.9 % 250 mL IVPB  Status:  Discontinued     500 mg 250 mL/hr over 60 Minutes Intravenous Every 24 hours 11-Dec-2018 1508 11/28/18 0717   December 11, 2018 1430  ceFEPIme (MAXIPIME) 1 g in sodium chloride 0.9 % 100 mL IVPB  Status:  Discontinued     1 g 200 mL/hr over 30 Minutes Intravenous  Once 2018-12-11 1424 12-11-18 1508      Microbiology: Results for orders placed or performed during the hospital encounter of December 11, 2018  Urine culture     Status: None   Collection Time: 12/11/2018  2:57 PM  Result Value Ref Range Status   Specimen Description   Final    URINE, RANDOM Performed at [facility], [address], [place], [place] [address]    Special Requests   Final    NONE Performed at [facility], [address], [place], [place] [address]    Culture   Final    NO GROWTH Performed at [facility], [address] [place], [place] [address]    Report Status 11/28/2018 FINAL  Final  Culture, blood (routine x 2)     Status: None   Collection Time: 12/11/18  3:01 PM  Result Value Ref Range Status   Specimen Description BLOOD RAC  Final   Special Requests   Final    BOTTLES DRAWN AEROBIC AND ANAEROBIC Blood Culture adequate volume   Culture   Final    NO GROWTH 5 DAYS Performed at [facility], [address] [place], [place] [address]    Report Status 12/02/2018 FINAL  Final  Culture, blood (routine x 2)     Status: None   Collection Time: 2018/12/11  4:06 PM  Result Value Ref Range Status   Specimen Description BLOOD BLOOD RIGHT HAND  Final   Special Requests   Final    BOTTLES DRAWN AEROBIC AND ANAEROBIC Blood Culture results may not be optimal due to an excessive volume of blood received in culture bottles   Culture   Final    NO GROWTH 5 DAYS Performed at [facility], [address]  [place], [place] [address]    Report Status 12/02/2018 FINAL  Final  Respiratory Panel by PCR     Status: None   Collection Time: Dec 11, 2018  5:21 PM  Result Value Ref Range Status   Adenovirus NOT DETECTED NOT DETECTED Final   Coronavirus 229E NOT DETECTED NOT DETECTED Final    Comment: (NOTE) The Coronavirus on the Respiratory Panel, DOES NOT test for the novel  Coronavirus (2019 nCoV)    Coronavirus HKU1 NOT DETECTED NOT DETECTED Final   Coronavirus NL63 NOT DETECTED NOT DETECTED Final   Coronavirus OC43 NOT DETECTED NOT DETECTED Final   Metapneumovirus NOT DETECTED NOT DETECTED Final   Rhinovirus / Enterovirus NOT  DETECTED NOT DETECTED Final   Influenza A NOT DETECTED NOT DETECTED Final   Influenza B NOT DETECTED NOT DETECTED Final   Parainfluenza Virus 1 NOT DETECTED NOT DETECTED Final   Parainfluenza Virus 2 NOT DETECTED NOT DETECTED Final   Parainfluenza Virus 3 NOT DETECTED NOT DETECTED Final   Parainfluenza Virus 4 NOT DETECTED NOT DETECTED Final   Respiratory Syncytial Virus NOT DETECTED NOT DETECTED Final   Bordetella pertussis NOT DETECTED NOT DETECTED Final   Chlamydophila pneumoniae NOT DETECTED NOT DETECTED Final   Mycoplasma pneumoniae NOT DETECTED NOT DETECTED Final  MRSA PCR Screening     Status: None   Collection Time: 12/07/2018  5:21 PM  Result Value Ref Range Status   MRSA by PCR NEGATIVE NEGATIVE Final    Comment:        The GeneXpert MRSA Assay (FDA approved for NASAL specimens only), is one component of a comprehensive MRSA colonization surveillance program. It is not intended to diagnose MRSA infection nor to guide or monitor treatment for MRSA infections. Performed at [facility], [address] [place], [place] [address]   Culture, respiratory (non-expectorated)     Status: None   Collection Time: 11/28/18 12:00 PM  Result Value Ref Range Status   Specimen Description   Final    TRACHEAL ASPIRATE Performed at [facility], [address] [place], [place] [address]    Special Requests   Final    NONE Performed at [facility], [address] [place], [place] [address]    Gram Stain   Final    ABUNDANT WBC PRESENT, PREDOMINANTLY MONONUCLEAR ABUNDANT GRAM POSITIVE COCCI    Culture   Final    Consistent with normal respiratory flora. Performed at [facility], [address] [place], [place] [address]    Report Status 11/30/2018 FINAL  Final  Culture, respiratory (non-expectorated)     Status: None (Preliminary result)   Collection Time: 12/01/18  1:15 PM  Result Value Ref Range Status   Specimen Description   Final    TRACHEAL ASPIRATE Performed at [facility], [address], [place], [place] [address]    Special Requests   Final    NONE Performed at [facility], [address] [place], [place] [address]    Gram Stain   Final    ABUNDANT WBC PRESENT,BOTH PMN AND MONONUCLEAR ABUNDANT GRAM POSITIVE COCCI Performed at [facility], [address], [place], [place] [address]    Culture ABUNDANT STAPHYLOCOCCUS AUREUS  Final   Report Status PENDING  Incomplete    Best Practice/Protocols:  VTE Prophylaxis: Lovenox (prophylaxtic dose) GI Prophylaxis: Proton Pump Inhibitor Continous Sedation Propofol+ Fentanyl  Events: Intubated, mechanically ventilated 3/2  Studies: Dg Abd 1 View  Result Date: 11/28/2018 CLINICAL DATA:  OG tube placement. EXAM: ABDOMEN - 1 VIEW COMPARISON:  None. FINDINGS: Tip of the enteric tube below the diaphragm in the stomach, the side port is not well visualized but likely just beyond the gastroesophageal junction. No evidence of free air in the upper abdomen. Cholecystectomy clips noted. IMPRESSION: Tip of the enteric tube below the diaphragm in the stomach. The side-port is not well visualized but likely just beyond the gastroesophageal junction. Electronically Signed   By: Narda Rutherford M.D.   On: 11/28/2018 01:34   Ct Head Wo  Contrast  Result Date: 11/30/2018 CLINICAL DATA:  Status post fall, altered mental status EXAM: CT HEAD WITHOUT CONTRAST CT CERVICAL SPINE  WITHOUT CONTRAST TECHNIQUE: Multidetector CT imaging of the head and cervical spine was performed following the standard protocol without intravenous contrast. Multiplanar CT image reconstructions of the cervical spine were also generated. COMPARISON:  None. FINDINGS: CT HEAD FINDINGS Brain: No evidence of acute infarction, hemorrhage, hydrocephalus, extra-axial collection or mass lesion/mass effect. Vascular: No hyperdense vessel or unexpected calcification. Skull: No acute calvarial injury.  Bilateral parietal burr holes. Sinuses/Orbits: Osteoma in the left posterior ethmoid sinus. Remainder the paranasal sinuses are clear. Visualized mastoid sinuses are clear. Visualized orbits demonstrate no focal abnormality. Other: None CT CERVICAL SPINE FINDINGS Alignment: 1-2 mm anterolisthesis of C3 on C4. Skull base and vertebrae: No acute fracture. No primary bone lesion or focal pathologic process. Soft tissues and spinal canal: No prevertebral fluid or swelling. No visible canal hematoma. Disc levels: Degenerative disc disease with disc height loss at C4-5, C5-6 and C6-7. Moderate right facet arthropathy at C2-3. Bilateral uncovertebral degenerative changes at C5-6 with mild foraminal narrowing. Upper chest: Lung apices are clear. Other: No fluid collection or hematoma. IMPRESSION: 1. No acute intracranial pathology. 2.  No acute osseous injury of the cervical spine. Electronically Signed   By: Elige Ko   On: 2018/12/21 15:02   Ct Cervical Spine Wo Contrast  Result Date: 2018-12-21 CLINICAL DATA:  Status post fall, altered mental status EXAM: CT HEAD WITHOUT CONTRAST CT CERVICAL SPINE WITHOUT CONTRAST TECHNIQUE: Multidetector CT imaging of the head and cervical spine was performed following the standard protocol without intravenous contrast. Multiplanar CT image  reconstructions of the cervical spine were also generated. COMPARISON:  None. FINDINGS: CT HEAD FINDINGS Brain: No evidence of acute infarction, hemorrhage, hydrocephalus, extra-axial collection or mass lesion/mass effect. Vascular: No hyperdense vessel or unexpected calcification. Skull: No acute calvarial injury.  Bilateral parietal burr holes. Sinuses/Orbits: Osteoma in the left posterior ethmoid sinus. Remainder the paranasal sinuses are clear. Visualized mastoid sinuses are clear. Visualized orbits demonstrate no focal abnormality. Other: None CT CERVICAL SPINE FINDINGS Alignment: 1-2 mm anterolisthesis of C3 on C4. Skull base and vertebrae: No acute fracture. No primary bone lesion or focal pathologic process. Soft tissues and spinal canal: No prevertebral fluid or swelling. No visible canal hematoma. Disc levels: Degenerative disc disease with disc height loss at C4-5, C5-6 and C6-7. Moderate right facet arthropathy at C2-3. Bilateral uncovertebral degenerative changes at C5-6 with mild foraminal narrowing. Upper chest: Lung apices are clear. Other: No fluid collection or hematoma. IMPRESSION: 1. No acute intracranial pathology. 2.  No acute osseous injury of the cervical spine. Electronically Signed   By: Elige Ko   On: 12/21/18 15:02   Dg Chest Port 1 View  Result Date: 11/30/2018 CLINICAL DATA:  Acute respiratory failure EXAM: PORTABLE CHEST 1 VIEW COMPARISON:  11/29/2018 FINDINGS: Cardiac shadow is stable. Endotracheal tube and nasogastric catheter are noted in satisfactory position. Right jugular central line is again seen. Bibasilar atelectasis is seen. Hyperinflation is again identified. No focal confluent infiltrate is seen. IMPRESSION: Mild bibasilar atelectasis. Tubes and lines as described. Electronically Signed   By: Alcide Clever M.D.   On: 11/30/2018 07:07   Portable Chest Xray  Result Date: 11/29/2018 CLINICAL DATA:  Intubation EXAM: PORTABLE CHEST 1 VIEW COMPARISON:  Yesterday  FINDINGS: Endotracheal tube tip between the clavicular heads and carina. The orogastric tube at least reaches the stomach. Right IJ line with tip at the SVC. Large lung volumes with interstitial coarsening at the bases. Normal heart size and mediastinal contours. No effusion or pneumothorax. IMPRESSION: 1.  Stable and unremarkable hardware positioning. 2. COPD with stable aeration. Electronically Signed   By: Marnee Spring M.D.   On: 11/29/2018 06:18   Dg Chest Port 1 View  Result Date: 11/28/2018 CLINICAL DATA:  Line placement EXAM: PORTABLE CHEST 1 VIEW COMPARISON:  11/28/2018, 11/27/2010 FINDINGS: Endotracheal tube tip is about 2 cm superior to the carina. Right-sided central venous catheter tip over the SVC. No pneumothorax. Hyperinflation with mild bronchitic changes at the bases. Emphysematous disease. Stable slightly enlarged cardiomediastinal silhouette. IMPRESSION: 1. Endotracheal tube tip about 2 cm superior to carina. 2. Right-sided central venous catheter tip over the SVC. No pneumothorax 3. Hyperinflation.  Stable interstitial opacities at the bases. Electronically Signed   By: Jasmine Pang M.D.   On: 11/28/2018 02:38   Portable Chest X-ray  Result Date: 11/28/2018 CLINICAL DATA:  Endotracheal tube placement. EXAM: PORTABLE CHEST 1 VIEW COMPARISON:  Radiographs yesterday. FINDINGS: Endotracheal tube tip 3.6 cm from the carina. Enteric tube in place, tip below the diaphragm, better assessed on concurrent abdominal radiographs. Mild cardiomegaly. Hyperinflation with chronic interstitial coarsening. No acute airspace disease, pneumothorax or large pleural effusion. IMPRESSION: 1. Endotracheal tube tip 3.6 cm from the carina. Enteric tube in place, tip below the diaphragm. 2. Mild cardiomegaly and chronic hyperinflation. Electronically Signed   By: Narda Rutherford M.D.   On: 11/28/2018 01:32   Dg Chest Portable 1 View  Result Date: 12/24/2018 CLINICAL DATA:  Acute shortness of breath EXAM:  PORTABLE CHEST 1 VIEW COMPARISON:  03/02/2018 and prior chest radiographs FINDINGS: Cardiomegaly again noted. Probable COPD noted. Mild chronic interstitial opacities in the LOWER lungs again noted. There is no evidence of focal airspace disease, pulmonary edema, suspicious pulmonary nodule/mass, pleural effusion, or pneumothorax. No acute bony abnormalities are identified. IMPRESSION: No evidence of acute cardiopulmonary disease. Cardiomegaly, COPD type changes and mild chronic LOWER lung interstitial opacities. Electronically Signed   By: Harmon Pier M.D.   On: 12/27/2018 13:32    Consults: Treatment Team:  Erin Fulling, MD   Subjective:    Overnight Issues: Anasarca worse.  She is still agitated when sedation is lightened.  This makes her very asynchronous with the ventilator and saturations drop.  Respiratory mechanics are very poor.  Family leaning towards comfort care. . Objective:  Vital signs for last 24 hours: Temp:  [98.2 F (36.8 C)-99 F (37.2 C)] 98.5 F (36.9 C) (03/06 1600) Pulse Rate:  [90-106] 94 (03/06 1800) Resp:  [9-20] 14 (03/06 1800) BP: (96-135)/(46-62) 115/57 (03/06 1800) SpO2:  [91 %-99 %] 96 % (03/06 1901) FiO2 (%):  [40 %-50 %] 50 % (03/06 1901)  Hemodynamic parameters for last 24 hours:    Intake/Output from previous day: 03/05 0701 - 03/06 0700 In: 5204.8 [I.V.:2503; NG/GT:1651.8; IV Piggyback:1000] Out: 735 [Urine:735]  Intake/Output this shift: No intake/output data recorded.  Vent settings for last 24 hours: Vent Mode: PRVC FiO2 (%):  [40 %-50 %] 50 % Set Rate:  [14 bmp] 14 bmp Vt Set:  [400 mL] 400 mL PEEP:  [8 cmH20] 8 cmH20 Plateau Pressure:  [19 cmH20-21 cmH20] 19 cmH20  Physical Exam:  General: well developed, obese female, intubated/sedated/mechanically ventilated.  Synchronous with the ventilator only when deeply sedated.   Neuro:  Sedated.  HEENT: PERRL. No icterus Cardiovascular: nsr, rrr, no R/G Lungs: diminished throughout,  coarse, rhonchi throughout Abdomen: +BS x4, soft, obese, non distended Musculoskeletal: no edema, no clubbing  Skin: intact no lesions present   Assessment/Plan:  1.  Acute on chronic  mixed respiratory failure with ventilator dependence due to acute exacerbation of COPD triggered by viral illness.  Continue ventilator support.  She had severe underlying disease prior to this event.  She has chronic hypoxic respiratory failure requiring 3 L of oxygen at baseline at home.  She had continued to smoke up to admission.    Her prognosis prior to this illness was guarded.  Limitations to weaning are related to agitation and asynchrony when agitated.  Sedatives have been readjusted.  Tinea Klonopin via the OG twice a day to see if we can transition her from IV infusions.  2.  COPD with chronic hypoxemic respiratory failure: She has acute exacerbation, continue steroids, bronchodilator treatments.  Sputum has shown copious gram-positive cocci.  Vancomycin was started last night.  3.  Acute on chronic kidney injury: She has developed anasarca and renal function continues to worsen.  Dialysis not indicated due to overall poor prognosis.  Continue to monitor renal function.  Monitor electrolytes.  Vancomycin adjustments per pharmacy to try to minimize kidney injury.  4.  Type 2 diabetes mellitus: ICU hyperglycemia protocol.  Currently on sliding scale insulin.  5.  Depression: Continue Zoloft.  6.  Nutrition: Continue tube feeds.  Currently tolerating    LOS: 5 days   Additional comments: Multidisciplinary rounds were performed with the ICU team.    Family not at bedside today for discussion with regards to plan of care for today.  Discussed with Palliative Care.  Critical Care Total Time*: 35 Minutes  C. Foye Spurling [place] [facility] 12/02/2018  *Care during the described time interval was provided by me and/or other providers on the critical care team.  I have reviewed this patient's available  data, including medical history, events of note, physical examination and test results as part of my evaluation.

## 2018-12-02 NOTE — Care Management (Signed)
Patient is intubated. Sister at bedside. Patient's daughter is Lanora Manis is next of kin [phone].  RNCM spoke with Lanora Manis regarding LTAC potential; list left at bedside per [email or web address]. Palliative is also meeting with patient.  Lanora Manis will take all into consideration and appreciates the support.  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 Per Gaye Pollack plans to move in with her if patient's condition changes.

## 2018-12-02 NOTE — Progress Notes (Signed)
[facility] -  at [facility]   PATIENT NAME: [name]    MR#:  [id number]  DATE OF BIRTH:  1951/08/07  SUBJECTIVE:   Remains intubated  REVIEW OF SYSTEMS:   Review of Systems  Unable to perform ROS: Intubated   Tolerating Diet:TF  DRUG ALLERGIES:   Allergies  Allergen Reactions  . Percocet [Oxycodone-Acetaminophen] Nausea And Vomiting  . Chantix [Varenicline] Other (See Comments)    Severe headache and vomiting  . Fluoxetine Other (See Comments)    Headaches  . Lisinopril Cough  . Other     Other reaction(s): Unknown Contrast  . Verapamil Other (See Comments)    Lazy    VITALS:  Blood pressure (!) 114/59, pulse 99, temperature 98.9 F (37.2 C), temperature source Oral, resp. rate 14, height 5\' 2"  (1.575 m), weight 103.9 kg, SpO2 92 %.  PHYSICAL EXAMINATION:   Physical Exam  GENERAL:  68 y.o.-year-old patient lying in the bed with no acute distress. Morbidly obese critically ill  EYES: Pupils equal, round, reactive to light and accommodation. No scleral icterus.  HEENT: Head atraumatic, normocephalic. Oropharynx and nasopharynx clear. Intubated on the ventilator NECK:  Supple, no jugular venous distention. No thyroid enlargement, no tenderness.  LUNGS: Normal breath sounds bilaterally, no wheezing, rales, rhonchi. No use of accessory muscles of respiration.  CARDIOVASCULAR: S1, S2 normal. No murmurs, rubs, or gallops. Mild tachycardia ABDOMEN: Soft, nontender, nondistended. Bowel sounds present. No organomegaly or mass.  EXTREMITIES: No cyanosis, clubbing or edema b/l.    NEUROLOGIC: on the ventilator  PSYCHIATRIC:  patient is sedated  SKIN: No obvious rash, lesion, or ulcer.   LABORATORY PANEL:  CBC Recent Labs  Lab 12/02/18 0425  WBC 27.2*  HGB 7.7*  HCT 27.5*  PLT 182    Chemistries  Recent Labs  Lab 12/26/2018 1256  11/30/18 0359  12/02/18 0425  NA 141   < > 139   < > 141  K 3.3*   < > 4.4   < > 5.1  CL 87*   < >  100   < > 104  CO2 45*   < > 31   < > 30  GLUCOSE 211*   < > 219*   < > 207*  BUN 21   < > 59*   < > 72*  CREATININE 0.75   < > 1.66*   < > 1.37*  CALCIUM 9.0   < > 7.2*   < > 7.5*  MG  --    < > 2.3  --   --   AST 17  --   --   --   --   ALT 15  --   --   --   --   ALKPHOS 78  --   --   --   --   BILITOT 0.6  --   --   --   --    < > = values in this interval not displayed.   Cardiac Enzymes Recent Labs  Lab 12/17/2018 1256  TROPONINI <0.03   RADIOLOGY:  No results found. ASSESSMENT AND PLAN:  [name]  is a 68 y.o. female with a known history of COPD on oxygen presents with a fall.  She was in respiratory distress and was placed on BiPAP for elevated carbon dioxide on ABG.   1.  Acute on chronic hypercarbic respiratory failure.  - PCO2 of 98.   -was emergently intubated and on the ventilator.  She failed BiPAP. -Continue IV Solu-Medrol and inhalers and nebulizer -Weaning per intensivist  2.  COPD exacerbation  With possible pneumonia. -  Empiric antibiotics with\Zithromax.  - Solu-Medrol and nebulizer treatments.  3.  Acute encephalopathy likely secondary to high carbon dioxide.  - Hold gabapentin and Requip.    4.  Depression.  Continue Zoloft.  Decrease dose of Klonopin.  5.  Type 2 diabetes mellitus.  Put on sliding scale.  6.  Hyperlipidemia on Pravachol  Critically ill. Long-term poor prognosis.  Palliative care team has been meeting with family CODE STATUS: DNR  DVT Prophylaxis: Lovenox  TOTAL TIME TAKING CARE OF THIS PATIENT: *32** minutes.  >50% time spent on counselling and coordination of care  POSSIBLE D/C IN **??* DAYS, DEPENDING ON CLINICAL CONDITION.  Note: This dictation was prepared with Dragon dictation along with smaller phrase technology. Any transcriptional errors that result from this process are unintentional.  Auburn Bilberry M.D on 12/02/2018 at 2:39 PM  Between 7am to 6pm - Pager - [phone]  After 6pm go to [email or web address] - Geophysicist/field seismologist  [facility]  Office  [phone]  CC: Primary care physician; [name], DOPatient ID: [name], female   DOB: [birth year], 68 y.o.   MRN: [id number]

## 2018-12-02 NOTE — Progress Notes (Signed)
Daily Progress Note   Patient Name: [name]       Date: 12/02/2018 DOB: 1951-03-04  Age: 68 y.o. MRN#: [id number] Attending Physician: Dustin Flock, MD Primary Care Physician: Valerie Roys, DO Admit Date: 12/14/2018  Reason for Consultation/Follow-up: Establishing goals of care  Subjective: Patient remains intubated/sedated. She does open eyes to painful stimuli/mouth care but does not show meaningful interaction. Appears comfortable.  Spoke with sister, Burman Nieves at bedside. Updated her on current status and plan of care. Again discussed diagnoses, interventions, and poor prognosis. Discussed continued aggressive medical interventions versus comfort pathway. Maggie tearful. She again tells me Adaiah would not want to continue living in this current state. She speaks of Cyndie being ready to go to heaven. Maggie understands daughter, Benjamine Mola, is primary Media planner and is faced with the decision of when to take her off the ventilator.   I did explain process of compassionate extubation to comfort measures only. Explained role of using medications to ensure symptoms are controlled and she is kept comfortable. Answered all questions and concerns for [employer] She hopes to be available this weekend when daughter arrives to town.   Also spoke with brother, Katherina Mires, via telephone and provided update on plan of care and prognosis.  Length of Stay: 5  Current Medications: Scheduled Meds:  . aspirin  81 mg Per Tube Daily  . chlorhexidine gluconate (MEDLINE KIT)  15 mL Mouth Rinse BID  . clonazePAM  0.5 mg Per Tube BID  . enoxaparin (LOVENOX) injection  40 mg Subcutaneous Q24H  . feeding supplement (PRO-STAT SUGAR FREE 64)  60 mL Oral BID  . free water  30 mL Per Tube Q4H  . insulin aspart   0-9 Units Subcutaneous Q4H  . ipratropium-albuterol  3 mL Nebulization Q4H  . mouth rinse  15 mL Mouth Rinse 10 times per day  . methylPREDNISolone (SOLU-MEDROL) injection  20 mg Intravenous Q12H  . multivitamin  15 mL Per Tube Daily  . pantoprazole sodium  40 mg Per Tube Daily  . polyethylene glycol  17 g Per Tube Daily  . pravastatin  40 mg Per Tube QHS  . rOPINIRole  0.5 mg Per Tube BID  . senna-docusate  2 tablet Oral BID  . sertraline  100 mg Per Tube QHS  . sodium chloride  flush  10-40 mL Intracatheter Q12H    Continuous Infusions: . sodium chloride 80 mL/hr at 12/02/18 0800  . feeding supplement (VITAL HIGH PROTEIN) 50 mL/hr at 12/02/18 0035  . fentaNYL infusion INTRAVENOUS 100 mcg/hr (12/02/18 0800)  . norepinephrine (LEVOPHED) Adult infusion Stopped (12/01/18 3419)  . propofol (DIPRIVAN) infusion 20 mcg/kg/min (12/02/18 0800)  . vancomycin      PRN Meds: acetaminophen **OR** acetaminophen, bisacodyl, fentaNYL, ipratropium-albuterol, midazolam, morphine injection, ondansetron **OR** ondansetron (ZOFRAN) IV, sodium chloride flush  Physical Exam Vitals signs and nursing note reviewed.  Constitutional:      Interventions: She is sedated and intubated.  HENT:     Head: Normocephalic and atraumatic.  Cardiovascular:     Rate and Rhythm: Normal rate.     Heart sounds: Normal heart sounds.  Pulmonary:     Effort: No tachypnea, accessory muscle usage or respiratory distress. She is intubated.     Breath sounds: Decreased breath sounds present.  Abdominal:     Tenderness: There is no abdominal tenderness.  Skin:    General: Skin is warm and dry.     Coloration: Skin is pale.  Neurological:     Comments: Intubated/sedated           Vital Signs: BP (!) 101/53   Pulse 98   Temp 98.7 F (37.1 C) (Oral)   Resp 14   Ht '5\' 2"'  (1.575 m)   Wt 103.9 kg   SpO2 94%   BMI 41.90 kg/m  SpO2: SpO2: 94 % O2 Device: O2 Device: Ventilator O2 Flow Rate: O2 Flow Rate (L/min): 40  L/min  Intake/output summary:   Intake/Output Summary (Last 24 hours) at 12/02/2018 1137 Last data filed at 12/02/2018 0800 Gross per 24 hour  Intake 4528.44 ml  Output 425 ml  Net 4103.44 ml   LBM: Last BM Date: (unknown) Baseline Weight: Weight: 104.3 kg Most recent weight: Weight: 103.9 kg       Palliative Assessment/Data: PPS 10%   Flowsheet Rows     Most Recent Value  Intake Tab  Referral Department  Critical care  Unit at Time of Referral  ICU  Palliative Care Primary Diagnosis  Pulmonary  Date Notified  11/29/18  Palliative Care Type  New Palliative care  Reason for referral  Clarify Goals of Care, End of Life Care Assistance  Date of Admission  12/21/2018  Date first seen by Palliative Care  11/30/18  # of days IP prior to Palliative referral  2  Clinical Assessment  Palliative Performance Scale Score  10%  Psychosocial & Spiritual Assessment  Palliative Care Outcomes  Patient/Family meeting held?  Yes  Who was at the meeting?  spoke with daughter and then sister via telephone  Palliative Care Outcomes  Clarified goals of care, Provided end of life care assistance, Provided psychosocial or spiritual support, ACP counseling assistance, Counseled regarding hospice      Patient Active Problem List   Diagnosis Date Noted  . Palliative care by specialist   . Goals of care, counseling/discussion   . Endotracheal tube present   . Agitation   . Acute respiratory failure with hypercapnia ([place]) 12/21/2018  . COPD exacerbation ([place])   . DNR (do not resuscitate) 08/05/2018  . Ventral hernia 11/22/2017  . Restless legs syndrome (RLS) 06/11/2015  . Type 2 diabetes, diet controlled ([place]) 03/22/2015  . Anxiety   . Hyperlipidemia   . Benign hypertensive renal disease   . Chronic kidney disease   . COPD, severe ([place])  02/27/2014    Palliative Care Assessment & Plan   Patient Profile: 68 y.o. female  with past medical history of COPD on home oxygen 3L [place], smoker,  obesity, HTN, HLD, restless leg, DM, depression, anxiety, CKD admitted on 12/04/2018 with respiratory distress and fall. Placed on BiPAP in ED but failed BiPAP and required intubation/mechanical ventilation with ICU admission. Patient with acute on chronic hypercarbic respiratory failure (PCO2 of 98) due to COPD exacerbation and viral bronchitis. Remains intubated and sedated on ventilator. Palliative medicine consultation for goals of care.   Assessment: Acute on chronic hypercarbic respiratory failure COPD exacerbation Viral bronchitis AKI with CKD Acute encephalopathy Agitation  Recommendations/Plan:  DNR in event of cardiac arrest. Otherwise continue current plan of care and watchful waiting until family arrives this weekend.  PMT provider has spoken with daughter, sister, and brother via telephone who are all in agreement that patient would NOT want prolonged heroic interventions including trach/peg placement.   Patient remains critically ill unable to wean on ventilator. Likely compassionate extubation to comfort measures when family available and ready. Daughter will not be at [facility] until Saturday.   Code Status: DNR   Code Status Orders  (From admission, onward)         Start     Ordered   11/29/18 1220  Do not attempt resuscitation (DNR)  Continuous    Question Answer Comment  In the event of cardiac or respiratory ARREST Do not call a "code blue"   In the event of cardiac or respiratory ARREST Do not perform Intubation, CPR, defibrillation or ACLS   In the event of cardiac or respiratory ARREST Use medication by any route, position, wound care, and other measures to relive pain and suffering. May use oxygen, suction and manual treatment of airway obstruction as needed for comfort.      11/29/18 1225        Code Status History    Date Active Date Inactive Code Status Order ID Comments User Context   12/27/2018 4920 11/29/2018 1225 Full Code [id number]  Loletha Grayer, MD ED         Prognosis:   Poor prognosis: Acute on chronic respiratory failure, COPD exacerbation, viral bronchitis, AKI on CKD, and severe agitation on ventilator making it difficult to wean.  Discharge Planning:  To Be Determined  Care plan was discussed with brother, sister, daughter, RN, Dr. Patsey Berthold  Thank you for allowing the Palliative Medicine Team to assist in the care of this patient.   Time In: 1110- 1250- Time Out: 1150 1300 Total Time 50 Prolonged Time Billed no      Greater than 50%  of this time was spent counseling and coordinating care related to the above assessment and plan.  Ihor Dow, FNP-C Palliative Medicine Team  Phone: [phone] Fax: [phone]  Please contact Palliative Medicine Team phone at [phone] for questions and concerns.

## 2018-12-02 NOTE — Plan of Care (Signed)
Pt cont on full vent support. Oxygen saturation issues last night Fi02 increased to 50%. . Propofol weaned off but pt cont on fentanyl weaned down to 100mEq from 200mEq. Un purposeful movement to lower ext. Opens eyes with stimulation  and voice.  Pt cont on tube feeding with minimal residual. Tmax of 99.0 this morning. 

## 2018-12-03 ENCOUNTER — Inpatient Hospital Stay: Payer: Medicare Other

## 2018-12-03 LAB — CULTURE, RESPIRATORY W GRAM STAIN

## 2018-12-03 LAB — BASIC METABOLIC PANEL
Anion gap: 6 (ref 5–15)
BUN: 90 mg/dL — ABNORMAL HIGH (ref 8–23)
CO2: 29 mmol/L (ref 22–32)
Calcium: 7.3 mg/dL — ABNORMAL LOW (ref 8.9–10.3)
Chloride: 106 mmol/L (ref 98–111)
Creatinine, Ser: 1.74 mg/dL — ABNORMAL HIGH (ref 0.44–1.00)
GFR calc Af Amer: 35 mL/min — ABNORMAL LOW (ref >60)
GFR calc non Af Amer: 30 mL/min — ABNORMAL LOW (ref >60)
Glucose, Bld: 239 mg/dL — ABNORMAL HIGH (ref 70–99)
Potassium: 5.3 mmol/L — ABNORMAL HIGH (ref 3.5–5.1)
Sodium: 141 mmol/L (ref 135–145)

## 2018-12-03 LAB — GLUCOSE, CAPILLARY
Glucose-Capillary: 224 mg/dL — ABNORMAL HIGH (ref 70–99)
Glucose-Capillary: 187 mg/dL — ABNORMAL HIGH (ref 70–99)
Glucose-Capillary: 174 mg/dL — ABNORMAL HIGH (ref 70–99)

## 2018-12-03 LAB — TRIGLYCERIDES: Triglycerides: 249 mg/dL — ABNORMAL HIGH (ref <150)

## 2018-12-03 LAB — CBC
HCT: 26.3 % — ABNORMAL LOW (ref 36.0–46.0)
Hemoglobin: 7.4 g/dL — ABNORMAL LOW (ref 12.0–15.0)
MCH: 29.6 pg (ref 26.0–34.0)
MCHC: 28.1 g/dL — ABNORMAL LOW (ref 30.0–36.0)
MCV: 105.2 fL — ABNORMAL HIGH (ref 80.0–100.0)
Platelets: 182 10*3/uL (ref 150–400)
RBC: 2.5 MIL/uL — ABNORMAL LOW (ref 3.87–5.11)
RDW: 15.6 % — ABNORMAL HIGH (ref 11.5–15.5)
WBC: 27.8 10*3/uL — ABNORMAL HIGH (ref 4.0–10.5)
nRBC: 0.4 % — ABNORMAL HIGH (ref 0.0–0.2)

## 2018-12-03 LAB — MAGNESIUM: Magnesium: 2.4 mg/dL (ref 1.7–2.4)

## 2018-12-03 MED ORDER — MORPHINE 100MG IN NS 100ML (1MG/ML) PREMIX INFUSION
5.0000 mg/h | INTRAVENOUS | Status: DC
  Administered 2018-12-03: 5 mg/h via INTRAVENOUS
  Filled 2018-12-03: qty 100

## 2018-12-03 MED ORDER — FENTANYL 2500MCG IN NS 250ML (10MCG/ML) PREMIX INFUSION: 25.0000 ug/h | INTRAVENOUS | Status: DC

## 2018-12-03 MED ORDER — FUROSEMIDE 10 MG/ML IJ SOLN
40.0000 mg | Freq: Once | INTRAMUSCULAR | Status: AC
  Administered 2018-12-03: 40 mg via INTRAVENOUS
  Filled 2018-12-03: qty 4

## 2018-12-03 MED ORDER — LORAZEPAM 2 MG/ML IJ SOLN
1.0000 mg | INTRAMUSCULAR | Status: DC | PRN
  Administered 2018-12-03: 1 mg via INTRAVENOUS
  Filled 2018-12-03: qty 1

## 2018-12-07 ENCOUNTER — Telehealth: Payer: Self-pay

## 2018-12-07 ENCOUNTER — Telehealth: Payer: Self-pay | Admitting: Pulmonary Disease

## 2018-12-07 NOTE — Telephone Encounter (Signed)
Death cert has been placed to LG's folder.

## 2018-12-07 NOTE — Telephone Encounter (Signed)
Death Certificate completed. [facility] aware. Copy faxed to [facility].

## 2018-12-07 NOTE — Telephone Encounter (Signed)
Received faxed death certificate from [facility], placed in LG box for signing.

## 2018-12-07 NOTE — Telephone Encounter (Signed)
Please see later telephone encounter dated as 12/07/18.

## 2018-12-12 DIAGNOSIS — J449 Chronic obstructive pulmonary disease, unspecified: Secondary | ICD-10-CM | POA: Diagnosis not present

## 2018-12-28 NOTE — Progress Notes (Signed)
Follow up - Critical Care Medicine Note  Patient Details:    [name] is an 68 y.o. female. 69 yo female, current smoker, admitted with acute on chronic respiratory failure secondary to AECOPD and viral bronchitis requiring Bipap.  Failed BiPAP and required intubation/mechanical ventilation.   Previously DNR.  Lines, Airways, Drains: Airway 7.5 mm (Active)  Secured at (cm) 25 cm 11/29/2018  8:00 PM  Measured From Lips 11/29/2018  8:00 PM  Secured Location Right 11/29/2018  7:15 PM  Secured By [employer] 11/29/2018  7:15 PM  Tube Holder Repositioned Yes 11/29/2018  7:15 PM  Cuff Pressure (cm H2O) 26 cm H2O 11/29/2018  7:15 PM  Site Condition Dry 11/29/2018  7:15 PM     CVC Triple Lumen 11/28/18 Right Internal jugular (Active)  Indication for Insertion or Continuance of Line Vasoactive infusions 11/29/2018  8:00 PM  Site Assessment Bleeding;Other (Comment) 11/29/2018  8:00 AM  Proximal Lumen Status Infusing;Flushed;Blood return noted 11/29/2018  8:00 AM  Medial Lumen Status Infusing;Flushed;Blood return noted 11/29/2018  8:00 AM  Distal Lumen Status Infusing;Flushed;Blood return noted 11/29/2018  8:00 AM  Dressing Type Occlusive;Gauze;Pressure 11/29/2018  8:00 AM  Dressing Status New drainage;Old drainage;Intact 11/29/2018  8:00 AM  Line Care Connections checked and tightened 11/29/2018  8:00 AM  Dressing Intervention Dressing reinforced 11/29/2018  8:00 AM  Dressing Change Due 12/05/18 11/29/2018  8:00 AM     NG/OG Tube Orogastric Center mouth Xray Documented cm marking at nare/ corner of mouth 55 cm (Active)  Cm Marking at Nare/Corner of Mouth (if applicable) 55 cm 11/29/2018  4:00 PM  External Length of Tube (cm) - (if applicable) 55 cm 11/28/2018  7:20 PM  Site Assessment Clean;Dry;Intact 11/29/2018  4:00 PM  Ongoing Placement Verification No change in cm markings or external length of tube from initial placement;No change in respiratory status;No acute changes, not attributed to clinical condition  11/29/2018  4:00 PM  Status Infusing tube feed 11/29/2018  4:00 PM  Intake (mL) 100 mL 11/28/2018 12:00 PM  Output (mL) 55 mL 11/29/2018  5:00 AM     Urethral Catheter BLRC, RN Non-latex (Active)  Indication for Insertion or Continuance of Catheter Unstable critically ill patients first 24-48 hours (See Criteria) 11/29/2018  4:00 PM  Site Assessment Clean;Intact;Dry 11/29/2018  4:00 PM  Catheter Maintenance Bag below level of bladder;No dependent loops;Catheter secured;Drainage bag/tubing not touching floor;Seal intact;Insertion date on drainage bag 11/29/2018  8:00 PM  Collection Container Standard drainage bag 11/29/2018  4:00 PM  Securement Method Securing device (Describe) 11/29/2018  4:00 PM  Urinary Catheter Interventions Unclamped 11/29/2018  4:00 PM  Output (mL) 110 mL 11/29/2018  8:00 PM    Anti-infectives:  Anti-infectives (From admission, onward)   Start     Dose/Rate Route Frequency Ordered Stop   12/02/18 2300  vancomycin (VANCOCIN) 1,500 mg in sodium chloride 0.9 % 500 mL IVPB     1,500 mg 250 mL/hr over 120 Minutes Intravenous Every 24 hours 12/01/18 2215     12/01/18 2215  vancomycin (VANCOCIN) 2,000 mg in sodium chloride 0.9 % 500 mL IVPB     2,000 mg 250 mL/hr over 120 Minutes Intravenous  Once 12/01/18 2210 12/02/18 0046   11/28/18 2200  levofloxacin (LEVAQUIN) IVPB 500 mg  Status:  Discontinued     500 mg 100 mL/hr over 60 Minutes Intravenous Every 24 hours 11/28/18 0728 11/28/18 1014   11/28/18 1800  azithromycin (ZITHROMAX) 500 mg in sodium chloride 0.9 % 250  mL IVPB  Status:  Discontinued     500 mg 250 mL/hr over 60 Minutes Intravenous Daily-1800 11/28/18 1014 11/28/18 1015   11/28/18 1030  azithromycin (ZITHROMAX) 500 mg in sodium chloride 0.9 % 250 mL IVPB  Status:  Discontinued     500 mg 250 mL/hr over 60 Minutes Intravenous Daily 11/28/18 1015 12/02/18 1020   12-11-18 1515  cefTRIAXone (ROCEPHIN) 2 g in sodium chloride 0.9 % 100 mL IVPB  Status:  Discontinued     2 g 200  mL/hr over 30 Minutes Intravenous Every 24 hours 12/11/2018 1508 2018/12/11 1801   December 11, 2018 1515  azithromycin (ZITHROMAX) 500 mg in sodium chloride 0.9 % 250 mL IVPB  Status:  Discontinued     500 mg 250 mL/hr over 60 Minutes Intravenous Every 24 hours 11-Dec-2018 1508 11/28/18 0717   December 11, 2018 1430  ceFEPIme (MAXIPIME) 1 g in sodium chloride 0.9 % 100 mL IVPB  Status:  Discontinued     1 g 200 mL/hr over 30 Minutes Intravenous  Once 2018-12-11 1424 12-11-18 1508      Microbiology: Results for orders placed or performed during the hospital encounter of December 11, 2018  Urine culture     Status: None   Collection Time: 12/11/2018  2:57 PM  Result Value Ref Range Status   Specimen Description   Final    URINE, RANDOM Performed at [facility], [address], [place], [place] [address]    Special Requests   Final    NONE Performed at [facility], [address], [place], [place] [address]    Culture   Final    NO GROWTH Performed at [facility], [address] [place], [place] [address]    Report Status 11/28/2018 FINAL  Final  Culture, blood (routine x 2)     Status: None   Collection Time: 12/11/18  3:01 PM  Result Value Ref Range Status   Specimen Description BLOOD RAC  Final   Special Requests   Final    BOTTLES DRAWN AEROBIC AND ANAEROBIC Blood Culture adequate volume   Culture   Final    NO GROWTH 5 DAYS Performed at [facility], [address] [place], [place] [address]    Report Status 12/02/2018 FINAL  Final  Culture, blood (routine x 2)     Status: None   Collection Time: 2018/12/11  4:06 PM  Result Value Ref Range Status   Specimen Description BLOOD BLOOD RIGHT HAND  Final   Special Requests   Final    BOTTLES DRAWN AEROBIC AND ANAEROBIC Blood Culture results may not be optimal due to an excessive volume of blood received in culture bottles   Culture   Final    NO GROWTH 5 DAYS Performed at [facility], [address]  [place], [place] [address]    Report Status 12/02/2018 FINAL  Final  Respiratory Panel by PCR     Status: None   Collection Time: Dec 11, 2018  5:21 PM  Result Value Ref Range Status   Adenovirus NOT DETECTED NOT DETECTED Final   Coronavirus 229E NOT DETECTED NOT DETECTED Final    Comment: (NOTE) The Coronavirus on the Respiratory Panel, DOES NOT test for the novel  Coronavirus (2019 nCoV)    Coronavirus HKU1 NOT DETECTED NOT DETECTED Final   Coronavirus NL63 NOT DETECTED NOT DETECTED Final   Coronavirus OC43 NOT DETECTED NOT DETECTED Final   Metapneumovirus NOT DETECTED NOT DETECTED Final   Rhinovirus / Enterovirus NOT  DETECTED NOT DETECTED Final   Influenza A NOT DETECTED NOT DETECTED Final   Influenza B NOT DETECTED NOT DETECTED Final   Parainfluenza Virus 1 NOT DETECTED NOT DETECTED Final   Parainfluenza Virus 2 NOT DETECTED NOT DETECTED Final   Parainfluenza Virus 3 NOT DETECTED NOT DETECTED Final   Parainfluenza Virus 4 NOT DETECTED NOT DETECTED Final   Respiratory Syncytial Virus NOT DETECTED NOT DETECTED Final   Bordetella pertussis NOT DETECTED NOT DETECTED Final   Chlamydophila pneumoniae NOT DETECTED NOT DETECTED Final   Mycoplasma pneumoniae NOT DETECTED NOT DETECTED Final  MRSA PCR Screening     Status: None   Collection Time: 12/07/2018  5:21 PM  Result Value Ref Range Status   MRSA by PCR NEGATIVE NEGATIVE Final    Comment:        The GeneXpert MRSA Assay (FDA approved for NASAL specimens only), is one component of a comprehensive MRSA colonization surveillance program. It is not intended to diagnose MRSA infection nor to guide or monitor treatment for MRSA infections. Performed at [facility], [address] [place], [place] [address]   Culture, respiratory (non-expectorated)     Status: None   Collection Time: 11/28/18 12:00 PM  Result Value Ref Range Status   Specimen Description   Final    TRACHEAL ASPIRATE Performed at [facility], [address] [place], [place] [address]    Special Requests   Final    NONE Performed at [facility], [address] [place], [place] [address]    Gram Stain   Final    ABUNDANT WBC PRESENT, PREDOMINANTLY MONONUCLEAR ABUNDANT GRAM POSITIVE COCCI    Culture   Final    Consistent with normal respiratory flora. Performed at [facility], [address] [place], [place] [address]    Report Status 11/30/2018 FINAL  Final  Culture, respiratory (non-expectorated)     Status: None   Collection Time: 12/01/18  1:15 PM  Result Value Ref Range Status   Specimen Description   Final    TRACHEAL ASPIRATE Performed at [facility], [address] [place], [place] [address]    Special Requests   Final    NONE Performed at [facility], [address] [place], [place] [address]    Gram Stain   Final    ABUNDANT WBC PRESENT,BOTH PMN AND MONONUCLEAR ABUNDANT GRAM POSITIVE COCCI Performed at [facility], [address] [place], [place] [address]    Culture ABUNDANT STAPHYLOCOCCUS AUREUS  Final   Report Status 12-26-18 FINAL  Final   Organism ID, Bacteria STAPHYLOCOCCUS AUREUS  Final      Susceptibility   Staphylococcus aureus - MIC*    CIPROFLOXACIN <=0.5 SENSITIVE Sensitive     ERYTHROMYCIN >=8 RESISTANT Resistant     GENTAMICIN <=0.5 SENSITIVE Sensitive     OXACILLIN <=0.25 SENSITIVE Sensitive     TETRACYCLINE <=1 SENSITIVE Sensitive     VANCOMYCIN <=0.5 SENSITIVE Sensitive     TRIMETH/SULFA <=10 SENSITIVE Sensitive     CLINDAMYCIN RESISTANT Resistant     RIFAMPIN <=0.5 SENSITIVE Sensitive     Inducible Clindamycin POSITIVE Resistant     * ABUNDANT STAPHYLOCOCCUS AUREUS    Best Practice/Protocols:  VTE Prophylaxis: Lovenox (prophylaxtic dose) GI Prophylaxis: Proton Pump Inhibitor Continous Sedation Precedex d/cd Propofol+ Fentanyl  Events: Intubated, mechanically ventilated 3/2  Studies: Dg Abd 1 View  Result Date:  December 26, 2018 CLINICAL DATA:  Follow-up ileus EXAM: ABDOMEN - 1 VIEW  COMPARISON:  11/28/2018 FINDINGS: Scattered large and small bowel gas is noted. No obstructive changes are identified. Gastric catheter tip is noted within the stomach although the proximal side port lies in the distal esophagus. This could be advanced several cm. Patchy infiltrative changes are noted in the bases. IMPRESSION: Gastric catheter as described. Scattered large and small bowel gas without obstructive change. Electronically Signed   By: Alcide Clever M.D.   On: December 25, 2018 07:53   Dg Abd 1 View  Result Date: 11/28/2018 CLINICAL DATA:  OG tube placement. EXAM: ABDOMEN - 1 VIEW COMPARISON:  None. FINDINGS: Tip of the enteric tube below the diaphragm in the stomach, the side port is not well visualized but likely just beyond the gastroesophageal junction. No evidence of free air in the upper abdomen. Cholecystectomy clips noted. IMPRESSION: Tip of the enteric tube below the diaphragm in the stomach. The side-port is not well visualized but likely just beyond the gastroesophageal junction. Electronically Signed   By: Narda Rutherford M.D.   On: 11/28/2018 01:34   Ct Head Wo Contrast  Result Date: 12/13/2018 CLINICAL DATA:  Status post fall, altered mental status EXAM: CT HEAD WITHOUT CONTRAST CT CERVICAL SPINE WITHOUT CONTRAST TECHNIQUE: Multidetector CT imaging of the head and cervical spine was performed following the standard protocol without intravenous contrast. Multiplanar CT image reconstructions of the cervical spine were also generated. COMPARISON:  None. FINDINGS: CT HEAD FINDINGS Brain: No evidence of acute infarction, hemorrhage, hydrocephalus, extra-axial collection or mass lesion/mass effect. Vascular: No hyperdense vessel or unexpected calcification. Skull: No acute calvarial injury.  Bilateral parietal burr holes. Sinuses/Orbits: Osteoma in the left posterior ethmoid sinus. Remainder the paranasal sinuses are clear.  Visualized mastoid sinuses are clear. Visualized orbits demonstrate no focal abnormality. Other: None CT CERVICAL SPINE FINDINGS Alignment: 1-2 mm anterolisthesis of C3 on C4. Skull base and vertebrae: No acute fracture. No primary bone lesion or focal pathologic process. Soft tissues and spinal canal: No prevertebral fluid or swelling. No visible canal hematoma. Disc levels: Degenerative disc disease with disc height loss at C4-5, C5-6 and C6-7. Moderate right facet arthropathy at C2-3. Bilateral uncovertebral degenerative changes at C5-6 with mild foraminal narrowing. Upper chest: Lung apices are clear. Other: No fluid collection or hematoma. IMPRESSION: 1. No acute intracranial pathology. 2.  No acute osseous injury of the cervical spine. Electronically Signed   By: Elige Ko   On: 12/24/2018 15:02   Ct Cervical Spine Wo Contrast  Result Date: 12/21/2018 CLINICAL DATA:  Status post fall, altered mental status EXAM: CT HEAD WITHOUT CONTRAST CT CERVICAL SPINE WITHOUT CONTRAST TECHNIQUE: Multidetector CT imaging of the head and cervical spine was performed following the standard protocol without intravenous contrast. Multiplanar CT image reconstructions of the cervical spine were also generated. COMPARISON:  None. FINDINGS: CT HEAD FINDINGS Brain: No evidence of acute infarction, hemorrhage, hydrocephalus, extra-axial collection or mass lesion/mass effect. Vascular: No hyperdense vessel or unexpected calcification. Skull: No acute calvarial injury.  Bilateral parietal burr holes. Sinuses/Orbits: Osteoma in the left posterior ethmoid sinus. Remainder the paranasal sinuses are clear. Visualized mastoid sinuses are clear. Visualized orbits demonstrate no focal abnormality. Other: None CT CERVICAL SPINE FINDINGS Alignment: 1-2 mm anterolisthesis of C3 on C4. Skull base and vertebrae: No acute fracture. No primary bone lesion or focal pathologic process. Soft tissues and spinal canal: No prevertebral fluid or  swelling. No visible canal hematoma. Disc levels: Degenerative disc disease with disc height loss at C4-5, C5-6 and C6-7. Moderate right facet arthropathy  at C2-3. Bilateral uncovertebral degenerative changes at C5-6 with mild foraminal narrowing. Upper chest: Lung apices are clear. Other: No fluid collection or hematoma. IMPRESSION: 1. No acute intracranial pathology. 2.  No acute osseous injury of the cervical spine. Electronically Signed   By: Elige Ko   On: Dec 26, 2018 15:02   Dg Chest Port 1 View  Result Date: 11/30/2018 CLINICAL DATA:  Acute respiratory failure EXAM: PORTABLE CHEST 1 VIEW COMPARISON:  11/29/2018 FINDINGS: Cardiac shadow is stable. Endotracheal tube and nasogastric catheter are noted in satisfactory position. Right jugular central line is again seen. Bibasilar atelectasis is seen. Hyperinflation is again identified. No focal confluent infiltrate is seen. IMPRESSION: Mild bibasilar atelectasis. Tubes and lines as described. Electronically Signed   By: Alcide Clever M.D.   On: 11/30/2018 07:07   Portable Chest Xray  Result Date: 11/29/2018 CLINICAL DATA:  Intubation EXAM: PORTABLE CHEST 1 VIEW COMPARISON:  Yesterday FINDINGS: Endotracheal tube tip between the clavicular heads and carina. The orogastric tube at least reaches the stomach. Right IJ line with tip at the SVC. Large lung volumes with interstitial coarsening at the bases. Normal heart size and mediastinal contours. No effusion or pneumothorax. IMPRESSION: 1. Stable and unremarkable hardware positioning. 2. COPD with stable aeration. Electronically Signed   By: Marnee Spring M.D.   On: 11/29/2018 06:18   Dg Chest Port 1 View  Result Date: 11/28/2018 CLINICAL DATA:  Line placement EXAM: PORTABLE CHEST 1 VIEW COMPARISON:  11/28/2018, 2020/03/1611 FINDINGS: Endotracheal tube tip is about 2 cm superior to the carina. Right-sided central venous catheter tip over the SVC. No pneumothorax. Hyperinflation with mild bronchitic changes  at the bases. Emphysematous disease. Stable slightly enlarged cardiomediastinal silhouette. IMPRESSION: 1. Endotracheal tube tip about 2 cm superior to carina. 2. Right-sided central venous catheter tip over the SVC. No pneumothorax 3. Hyperinflation.  Stable interstitial opacities at the bases. Electronically Signed   By: Jasmine Pang M.D.   On: 11/28/2018 02:38   Portable Chest X-ray  Result Date: 11/28/2018 CLINICAL DATA:  Endotracheal tube placement. EXAM: PORTABLE CHEST 1 VIEW COMPARISON:  Radiographs yesterday. FINDINGS: Endotracheal tube tip 3.6 cm from the carina. Enteric tube in place, tip below the diaphragm, better assessed on concurrent abdominal radiographs. Mild cardiomegaly. Hyperinflation with chronic interstitial coarsening. No acute airspace disease, pneumothorax or large pleural effusion. IMPRESSION: 1. Endotracheal tube tip 3.6 cm from the carina. Enteric tube in place, tip below the diaphragm. 2. Mild cardiomegaly and chronic hyperinflation. Electronically Signed   By: Narda Rutherford M.D.   On: 11/28/2018 01:32   Dg Chest Portable 1 View  Result Date: 2018-12-26 CLINICAL DATA:  Acute shortness of breath EXAM: PORTABLE CHEST 1 VIEW COMPARISON:  03/02/2018 and prior chest radiographs FINDINGS: Cardiomegaly again noted. Probable COPD noted. Mild chronic interstitial opacities in the LOWER lungs again noted. There is no evidence of focal airspace disease, pulmonary edema, suspicious pulmonary nodule/mass, pleural effusion, or pneumothorax. No acute bony abnormalities are identified. IMPRESSION: No evidence of acute cardiopulmonary disease. Cardiomegaly, COPD type changes and mild chronic LOWER lung interstitial opacities. Electronically Signed   By: Harmon Pier M.D.   On: 2018-12-26 13:32    Consults: Treatment Team:  Erin Fulling, MD   Subjective:    Overnight Issues: No significant response when sedation is lightened.  Worsening anasarca.  Urine output dropping.  Worsening renal  function.  Objective:  Vital signs for last 24 hours: Temp:  [98.5 F (36.9 C)-98.9 F (37.2 C)] 98.9 F (37.2 C) (03/07  0800) Pulse Rate:  [94-118] 108 (03/07 1106) Resp:  [13-23] 22 (03/07 1106) BP: (90-135)/(47-62) 97/49 (03/07 1100) SpO2:  [88 %-96 %] 93 % (03/07 1106) FiO2 (%):  [50 %-60 %] 60 % (03/07 1106) Weight:  [118.8 kg] 118.8 kg (03/07 0430)  Hemodynamic parameters for last 24 hours:    Intake/Output from previous day: 03/06 0701 - 03/07 0700 In: 1231.7 [I.V.:1231.7] Out: 1160 [Urine:1080; Emesis/NG output:80]  Intake/Output this shift: Total I/O In: 2368 [I.V.:1847.6; IV Piggyback:520.4] Out: 100 [Urine:100]  Vent settings for last 24 hours: Vent Mode: PRVC FiO2 (%):  [50 %-60 %] 60 % Set Rate:  [14 bmp] 14 bmp Vt Set:  [400 mL] 400 mL PEEP:  [8 cmH20] 8 cmH20 Plateau Pressure:  [17 cmH20-22 cmH20] 22 cmH20  Physical Exam:  General: well developed, obese female, intubated/sedated/mechanically ventilated.  Synchronous with the ventilator only and deeply sedated.   Neuro:  Sedated.  HEENT: PERRL. No icterus Cardiovascular: nsr, rrr, no R/G Lungs: diminished throughout, coarse, no wheezes Abdomen: +BS x4, soft, obese, non distended Musculoskeletal: no edema, no clubbing  Skin: intact no lesions present   Assessment/Plan:  1.  Acute on chronic mixed respiratory failure with ventilator dependence due to acute exacerbation of COPD triggered by viral illness.  She has continued to do poorly not tolerating ventilator modes well unless totally sedated.  Family at bedside they would like to transition her to comfort care.  We will do terminal wean to comfort.  2.  Methicillin sensitive Staphylococcus aureus pneumonia versus bronchitis: She had been on vancomycin as she is being transitioned to comfort care this will be discontinued.  3.  COPD with chronic hypoxemic respiratory failure: Continue supplemental oxygen after extubation and nebulization treatments.   Morphine for control of dyspnea.  4.  Acute on chronic kidney injury: Being transitioned to comfort care.  No dialysis contemplated.  5.  Type 2 diabetes mellitus: Discontinue sliding scale as being transitioned to comfort care  6.  Depression: Continue Zoloft.  7.  Nutrition: Transitioned to comfort care   LOS: 6 days   Additional comments: End-of-life discussions with the patient's family at bedside, this included daughter who is POA and patient's sister and cousin.  Will extubate and transition the patient to comfort care after discussion today.  Critical Care Total Time*: 45 Minutes  C. Foye Spurling [facility] 12/04/2018  *Care during the described time interval was provided by me and/or other providers on the critical care team.  I have reviewed this patient's available data, including medical history, events of note, physical examination and test results as part of my evaluation.

## 2018-12-28 NOTE — Death Summary Note (Signed)
DEATH SUMMARY   Patient Details  Name: [name] MRN: [id number] DOB: 07-18-1951  Admission/Discharge Information   Admit Date:  12/11/18  Date of Death:  December 17, 2018  Time of Death:  15:10 HRS  Length of Stay: 6  Referring Physician: [name], DO   Reason(s) for Hospitalization  Respiratory distress in the setting of COPD with chronic hypoxemic respiratory failure.  Diagnoses  Preliminary cause of death:   Acute on chronic respiratory failure with hypoxia and hypercarbia COPD GOLD class IV with exacerbation Pneumonia versus bronchitis with methicillin sensitive Staphylococcus aureus Acute kidney injury Moderate protein malnutrition Delirium due to toxic metabolic encephalopathy Fall at home Secondary Diagnoses (including complications and co-morbidities):  Active Problems:   Acute respiratory failure with hypercapnia (HCC)   COPD exacerbation (HCC)   Palliative care by specialist   Goals of care, counseling/discussion   Endotracheal tube present   Agitation   Brief Hospital Course (including significant findings, care, treatment, and services provided and events leading to death)  [name] is a 68 y.o. year old female who is a current smoker presented as below: This is a 68 yo female with PMH of Anxiety, CKD, COPD, Home O2 @3L  via nasal canula, Depression, Restless Leg Syndrome, Type II Diabetes Mellitus, Hyperlipidemia, Obesity, Anxiety, and Former Smoker.  She presented to [facility] ER on 12-11-22 via EMS from home with altered mental status and shortness of breath.  EMS initially called due to a fall, upon EMS arrival pt found on her knees in the bathroom with altered mental status and hypoxia O2 sats in the 70's.  Per ER notes en route to the ER EMS administered 125 mg solumedrol, albuterol neb, duoneb, and 2g of magnesium.   Upon arrival to the ER pt remained confused, abg revealed pH 7.33/pCO2 98/pO2 213/bicarb 51.7.  She was placed on Bipap.  Lab results  revealed K+ 3.3, CO2 45, BNP 55, troponin <0.03, wbc 22.8, and influenza pcr negative.  CXR negative for acute abnormality.  She was subsequently admitted to the stepdown unit by hospitalist team for additional workup and treatment.  The patient had prior DNR/DNI that is however this was reversed at the time of admission.  She failed to respond to BiPAP and was intubated and mechanically ventilated.  She tolerated mechanical ventilation poorly and required sedatives due to agitation that was likely related to toxic metabolic encephalopathy.  Initially there was no  evidence of pneumonia.  Her sputum changed while on the ventilator and she was noted to have methicillin sensitive Staphylococcus aureus.  This was treated with the appropriate antibiotics.  She continued to fail to progress with regards to mechanical ventilation and continued to be agitated when sedation was lightened.  During the hospitalization the Palliative Care team was consulted.  I also had multiple conversations with the family.  They stated that she prior had a DNR/DNI in place and this was reinstated.  Her quality of life and activities had been severely hindered by her advanced COPD.  Patient continued to decline in her renal function continued to worsen during her hospitalization.  The patient's family determined that if she could not be weaned off of the ventilator after 72 hours comfort measures should be instituted.  Patient failed to wean and family gathered on March 21, 2024and requested that comfort measures be initiated.  This was done and the patient expired quietly at 15:10 hrs. on Dec 17, 2018.  His family was in attendance.  Pertinent Labs  and Studies  Significant Diagnostic Studies Dg Abd 1 View  Result Date: 12/25/2018 CLINICAL DATA:  Follow-up ileus EXAM: ABDOMEN - 1 VIEW COMPARISON:  11/28/2018 FINDINGS: Scattered large and small bowel gas is noted. No obstructive changes are identified. Gastric catheter tip is noted within the  stomach although the proximal side port lies in the distal esophagus. This could be advanced several cm. Patchy infiltrative changes are noted in the bases. IMPRESSION: Gastric catheter as described. Scattered large and small bowel gas without obstructive change. Electronically Signed   By: Alcide Clever M.D.   On: 12/26/2018 07:53   Dg Abd 1 View  Result Date: 11/28/2018 CLINICAL DATA:  OG tube placement. EXAM: ABDOMEN - 1 VIEW COMPARISON:  None. FINDINGS: Tip of the enteric tube below the diaphragm in the stomach, the side port is not well visualized but likely just beyond the gastroesophageal junction. No evidence of free air in the upper abdomen. Cholecystectomy clips noted. IMPRESSION: Tip of the enteric tube below the diaphragm in the stomach. The side-port is not well visualized but likely just beyond the gastroesophageal junction. Electronically Signed   By: Narda Rutherford M.D.   On: 11/28/2018 01:34   Ct Head Wo Contrast  Result Date: 23-Dec-2018 CLINICAL DATA:  Status post fall, altered mental status EXAM: CT HEAD WITHOUT CONTRAST CT CERVICAL SPINE WITHOUT CONTRAST TECHNIQUE: Multidetector CT imaging of the head and cervical spine was performed following the standard protocol without intravenous contrast. Multiplanar CT image reconstructions of the cervical spine were also generated. COMPARISON:  None. FINDINGS: CT HEAD FINDINGS Brain: No evidence of acute infarction, hemorrhage, hydrocephalus, extra-axial collection or mass lesion/mass effect. Vascular: No hyperdense vessel or unexpected calcification. Skull: No acute calvarial injury.  Bilateral parietal burr holes. Sinuses/Orbits: Osteoma in the left posterior ethmoid sinus. Remainder the paranasal sinuses are clear. Visualized mastoid sinuses are clear. Visualized orbits demonstrate no focal abnormality. Other: None CT CERVICAL SPINE FINDINGS Alignment: 1-2 mm anterolisthesis of C3 on C4. Skull base and vertebrae: No acute fracture. No primary  bone lesion or focal pathologic process. Soft tissues and spinal canal: No prevertebral fluid or swelling. No visible canal hematoma. Disc levels: Degenerative disc disease with disc height loss at C4-5, C5-6 and C6-7. Moderate right facet arthropathy at C2-3. Bilateral uncovertebral degenerative changes at C5-6 with mild foraminal narrowing. Upper chest: Lung apices are clear. Other: No fluid collection or hematoma. IMPRESSION: 1. No acute intracranial pathology. 2.  No acute osseous injury of the cervical spine. Electronically Signed   By: Elige Ko   On: 12-23-2018 15:02   Ct Cervical Spine Wo Contrast  Result Date: 2018/12/23 CLINICAL DATA:  Status post fall, altered mental status EXAM: CT HEAD WITHOUT CONTRAST CT CERVICAL SPINE WITHOUT CONTRAST TECHNIQUE: Multidetector CT imaging of the head and cervical spine was performed following the standard protocol without intravenous contrast. Multiplanar CT image reconstructions of the cervical spine were also generated. COMPARISON:  None. FINDINGS: CT HEAD FINDINGS Brain: No evidence of acute infarction, hemorrhage, hydrocephalus, extra-axial collection or mass lesion/mass effect. Vascular: No hyperdense vessel or unexpected calcification. Skull: No acute calvarial injury.  Bilateral parietal burr holes. Sinuses/Orbits: Osteoma in the left posterior ethmoid sinus. Remainder the paranasal sinuses are clear. Visualized mastoid sinuses are clear. Visualized orbits demonstrate no focal abnormality. Other: None CT CERVICAL SPINE FINDINGS Alignment: 1-2 mm anterolisthesis of C3 on C4. Skull base and vertebrae: No acute fracture. No primary bone lesion or focal pathologic process. Soft tissues and spinal canal: No prevertebral fluid  or swelling. No visible canal hematoma. Disc levels: Degenerative disc disease with disc height loss at C4-5, C5-6 and C6-7. Moderate right facet arthropathy at C2-3. Bilateral uncovertebral degenerative changes at C5-6 with mild foraminal  narrowing. Upper chest: Lung apices are clear. Other: No fluid collection or hematoma. IMPRESSION: 1. No acute intracranial pathology. 2.  No acute osseous injury of the cervical spine. Electronically Signed   By: Elige Ko   On: 12/23/2018 15:02   Dg Chest Port 1 View  Result Date: 11/30/2018 CLINICAL DATA:  Acute respiratory failure EXAM: PORTABLE CHEST 1 VIEW COMPARISON:  11/29/2018 FINDINGS: Cardiac shadow is stable. Endotracheal tube and nasogastric catheter are noted in satisfactory position. Right jugular central line is again seen. Bibasilar atelectasis is seen. Hyperinflation is again identified. No focal confluent infiltrate is seen. IMPRESSION: Mild bibasilar atelectasis. Tubes and lines as described. Electronically Signed   By: Alcide Clever M.D.   On: 11/30/2018 07:07   Portable Chest Xray  Result Date: 11/29/2018 CLINICAL DATA:  Intubation EXAM: PORTABLE CHEST 1 VIEW COMPARISON:  Yesterday FINDINGS: Endotracheal tube tip between the clavicular heads and carina. The orogastric tube at least reaches the stomach. Right IJ line with tip at the SVC. Large lung volumes with interstitial coarsening at the bases. Normal heart size and mediastinal contours. No effusion or pneumothorax. IMPRESSION: 1. Stable and unremarkable hardware positioning. 2. COPD with stable aeration. Electronically Signed   By: Marnee Spring M.D.   On: 11/29/2018 06:18   Dg Chest Port 1 View  Result Date: 11/28/2018 CLINICAL DATA:  Line placement EXAM: PORTABLE CHEST 1 VIEW COMPARISON:  11/28/2018, 11/27/2010 FINDINGS: Endotracheal tube tip is about 2 cm superior to the carina. Right-sided central venous catheter tip over the SVC. No pneumothorax. Hyperinflation with mild bronchitic changes at the bases. Emphysematous disease. Stable slightly enlarged cardiomediastinal silhouette. IMPRESSION: 1. Endotracheal tube tip about 2 cm superior to carina. 2. Right-sided central venous catheter tip over the SVC. No pneumothorax 3.  Hyperinflation.  Stable interstitial opacities at the bases. Electronically Signed   By: Jasmine Pang M.D.   On: 11/28/2018 02:38   Portable Chest X-ray  Result Date: 11/28/2018 CLINICAL DATA:  Endotracheal tube placement. EXAM: PORTABLE CHEST 1 VIEW COMPARISON:  Radiographs yesterday. FINDINGS: Endotracheal tube tip 3.6 cm from the carina. Enteric tube in place, tip below the diaphragm, better assessed on concurrent abdominal radiographs. Mild cardiomegaly. Hyperinflation with chronic interstitial coarsening. No acute airspace disease, pneumothorax or large pleural effusion. IMPRESSION: 1. Endotracheal tube tip 3.6 cm from the carina. Enteric tube in place, tip below the diaphragm. 2. Mild cardiomegaly and chronic hyperinflation. Electronically Signed   By: Narda Rutherford M.D.   On: 11/28/2018 01:32   Dg Chest Portable 1 View  Result Date: 12/05/2018 CLINICAL DATA:  Acute shortness of breath EXAM: PORTABLE CHEST 1 VIEW COMPARISON:  03/02/2018 and prior chest radiographs FINDINGS: Cardiomegaly again noted. Probable COPD noted. Mild chronic interstitial opacities in the LOWER lungs again noted. There is no evidence of focal airspace disease, pulmonary edema, suspicious pulmonary nodule/mass, pleural effusion, or pneumothorax. No acute bony abnormalities are identified. IMPRESSION: No evidence of acute cardiopulmonary disease. Cardiomegaly, COPD type changes and mild chronic LOWER lung interstitial opacities. Electronically Signed   By: Harmon Pier M.D.   On: 11/29/2018 13:32    Microbiology Recent Results (from the past 240 hour(s))  Urine culture     Status: None   Collection Time: 12/16/2018  2:57 PM  Result Value Ref Range Status  Specimen Description   Final    URINE, RANDOM Performed at [facility], [address], [place], [place] [address]    Special Requests   Final    NONE Performed at [facility], [address] [place], [place] [address]    Culture   Final     NO GROWTH Performed at [facility], [address], [place], [place] [address]    Report Status 11/28/2018 FINAL  Final  Culture, blood (routine x 2)     Status: None   Collection Time: 12/21/2018  3:01 PM  Result Value Ref Range Status   Specimen Description BLOOD RAC  Final   Special Requests   Final    BOTTLES DRAWN AEROBIC AND ANAEROBIC Blood Culture adequate volume   Culture   Final    NO GROWTH 5 DAYS Performed at [facility], [address] [place], [place] [address]    Report Status 12/02/2018 FINAL  Final  Culture, blood (routine x 2)     Status: None   Collection Time: 12/15/2018  4:06 PM  Result Value Ref Range Status   Specimen Description BLOOD BLOOD RIGHT HAND  Final   Special Requests   Final    BOTTLES DRAWN AEROBIC AND ANAEROBIC Blood Culture results may not be optimal due to an excessive volume of blood received in culture bottles   Culture   Final    NO GROWTH 5 DAYS Performed at [facility], [address] [place], [place] [address]    Report Status 12/02/2018 FINAL  Final  Respiratory Panel by PCR     Status: None   Collection Time: 12/13/2018  5:21 PM  Result Value Ref Range Status   Adenovirus NOT DETECTED NOT DETECTED Final   Coronavirus 229E NOT DETECTED NOT DETECTED Final    Comment: (NOTE) The Coronavirus on the Respiratory Panel, DOES NOT test for the novel  Coronavirus (2019 nCoV)    Coronavirus HKU1 NOT DETECTED NOT DETECTED Final   Coronavirus NL63 NOT DETECTED NOT DETECTED Final   Coronavirus OC43 NOT DETECTED NOT DETECTED Final   Metapneumovirus NOT DETECTED NOT DETECTED Final   Rhinovirus / Enterovirus NOT DETECTED NOT DETECTED Final   Influenza A NOT DETECTED NOT DETECTED Final   Influenza B NOT DETECTED NOT DETECTED Final   Parainfluenza Virus 1 NOT DETECTED NOT DETECTED Final   Parainfluenza Virus 2 NOT DETECTED NOT DETECTED Final   Parainfluenza Virus 3 NOT DETECTED NOT DETECTED Final   Parainfluenza Virus 4  NOT DETECTED NOT DETECTED Final   Respiratory Syncytial Virus NOT DETECTED NOT DETECTED Final   Bordetella pertussis NOT DETECTED NOT DETECTED Final   Chlamydophila pneumoniae NOT DETECTED NOT DETECTED Final   Mycoplasma pneumoniae NOT DETECTED NOT DETECTED Final  MRSA PCR Screening     Status: None   Collection Time: 12/25/2018  5:21 PM  Result Value Ref Range Status   MRSA by PCR NEGATIVE NEGATIVE Final    Comment:        The GeneXpert MRSA Assay (FDA approved for NASAL specimens only), is one component of a comprehensive MRSA colonization surveillance program. It is not intended to diagnose MRSA infection nor to guide or monitor treatment for MRSA infections. Performed at [facility], [address] [place], [place] [address]   Culture, respiratory (non-expectorated)     Status: None   Collection Time: 11/28/18 12:00 PM  Result Value Ref Range Status   Specimen Description   Final  TRACHEAL ASPIRATE Performed at [facility], [address] [place], [place] [address]    Special Requests   Final    NONE Performed at [facility], [address] [place], [place] [address]    Gram Stain   Final    ABUNDANT WBC PRESENT, PREDOMINANTLY MONONUCLEAR ABUNDANT GRAM POSITIVE COCCI    Culture   Final    Consistent with normal respiratory flora. Performed at [facility], [address] [place], [place] [address]    Report Status 11/30/2018 FINAL  Final  Culture, respiratory (non-expectorated)     Status: None   Collection Time: 12/01/18  1:15 PM  Result Value Ref Range Status   Specimen Description   Final    TRACHEAL ASPIRATE Performed at [facility], [address] [place], [place] [address]    Special Requests   Final    NONE Performed at [facility], [address] [place], [place] [address]    Gram Stain   Final    ABUNDANT WBC PRESENT,BOTH PMN AND MONONUCLEAR ABUNDANT GRAM POSITIVE COCCI Performed at  [facility], [address] [place], [place] [address]    Culture ABUNDANT STAPHYLOCOCCUS AUREUS  Final   Report Status 12/21/2018 FINAL  Final   Organism ID, Bacteria STAPHYLOCOCCUS AUREUS  Final      Susceptibility   Staphylococcus aureus - MIC*    CIPROFLOXACIN <=0.5 SENSITIVE Sensitive     ERYTHROMYCIN >=8 RESISTANT Resistant     GENTAMICIN <=0.5 SENSITIVE Sensitive     OXACILLIN <=0.25 SENSITIVE Sensitive     TETRACYCLINE <=1 SENSITIVE Sensitive     VANCOMYCIN <=0.5 SENSITIVE Sensitive     TRIMETH/SULFA <=10 SENSITIVE Sensitive     CLINDAMYCIN RESISTANT Resistant     RIFAMPIN <=0.5 SENSITIVE Sensitive     Inducible Clindamycin POSITIVE Resistant     * ABUNDANT STAPHYLOCOCCUS AUREUS    Lab Basic Metabolic Panel: Recent Labs  Lab December 26, 2018 2058 11/28/18 0459 11/29/18 0535 11/30/18 0359 12/01/18 0451 12/02/18 0425 12/02/2018 0436  NA 141 140 138 139 139 141 141  K 3.4* 3.8 4.5 4.4 4.4 5.1 5.3*  CL 88* 92* 92* 100 101 104 106  CO2 42* 37* 35* 31 32 30 29  GLUCOSE 194* 221* 157* 219* 210* 207* 239*  BUN 21 27* 49* 59* 59* 72* 90*  CREATININE 0.91 1.05* 1.80* 1.66* 1.46* 1.37* 1.74*  CALCIUM 8.5* 8.0* 7.7* 7.2* 7.3* 7.5* 7.3*  MG 2.4 2.1  --  2.3  --   --  2.4  PHOS 2.7 1.4*  --  4.6  --   --   --    Liver Function Tests: Recent Labs  Lab 2018/12/26 1256 11/29/18 0535 12/02/18 0425  AST 17  --   --   ALT 15  --   --   ALKPHOS 78  --   --   BILITOT 0.6  --   --   PROT 7.3  --   --   ALBUMIN 3.6 3.2* 2.6*   No results for input(s): LIPASE, AMYLASE in the last 168 hours. No results for input(s): AMMONIA in the last 168 hours. CBC: Recent Labs  Lab 2018-12-26 1256 11/28/18 0459 11/30/18 0359 12/01/18 0451 12/02/18 0425 12/07/2018 0436  WBC 22.8* 18.1* 15.1* 23.4* 27.2* 27.8*  NEUTROABS 18.8*  --   --   --   --   --   HGB 12.2 9.9* 8.0* 7.4* 7.7* 7.4*  HCT 40.7 32.8* 26.2* 24.8*  27.5* 26.3*  MCV 100.5* 99.4 98.5 100.8* 105.0* 105.2*  PLT 276 237 176 198  182 182   Cardiac Enzymes: Recent Labs  Lab 2018-12-06 1256  TROPONINI <0.03   Sepsis Labs: Recent Labs  Lab December 06, 2018 1255  11/28/18 0459 11/29/18 0535 11/30/18 0359 12/01/18 0451 12/02/18 0425 12/07/2018 0436  PROCALCITON <0.10  --  <0.10 <0.10  --   --   --   --   WBC  --    < > 18.1*  --  15.1* 23.4* 27.2* 27.8*   < > = values in this interval not displayed.    Procedures/Operations  1.  CT head 12-06-2018 2.  C-spine December 06, 2018  C. Foye Spurling [place] [facility]    12/21/2018, 3:34 PM

## 2018-12-28 NOTE — Plan of Care (Signed)
Patient remains intubated at this time. Remains on tube feed.  Thick secretions this shift. Afebrile.  No acute concerns at this time. Will continue to monitor.

## 2018-12-28 NOTE — Progress Notes (Signed)
Pt extubated per MD order, pt placed on 3Lnc

## 2018-12-28 NOTE — Progress Notes (Signed)
[facility] [place] [facility]   PATIENT NAME: [name]    MR#:  [id number]  DATE OF BIRTH:  May 27, 1951  SUBJECTIVE:   Remains intubated  REVIEW OF SYSTEMS:   Review of Systems  Unable to perform ROS: Intubated   Tolerating Diet:TF  DRUG ALLERGIES:   Allergies  Allergen Reactions  . Percocet [Oxycodone-Acetaminophen] Nausea And Vomiting  . Chantix [Varenicline] Other (See Comments)    Severe headache and vomiting  . Fluoxetine Other (See Comments)    Headaches  . Lisinopril Cough  . Other     Other reaction(s): Unknown Contrast  . Verapamil Other (See Comments)    Lazy    VITALS:  Blood pressure (!) 97/49, pulse (!) 108, temperature 98.9 F (37.2 C), temperature source Oral, resp. rate (!) 22, height 5\' 2"  (1.575 m), weight 118.8 kg, SpO2 93 %.  PHYSICAL EXAMINATION:   Physical Exam  GENERAL:  68 y.o.-year-old patient lying in the bed with no acute distress. Morbidly obese critically ill  EYES: Pupils equal, round, reactive to light and accommodation. No scleral icterus.  HEENT: Head atraumatic, normocephalic. Oropharynx and nasopharynx clear. Intubated on the ventilator NECK:  Supple, no jugular venous distention. No thyroid enlargement, no tenderness.  LUNGS: Normal breath sounds bilaterally, no wheezing, rales, rhonchi. No use of accessory muscles of respiration.  CARDIOVASCULAR: S1, S2 normal. No murmurs, rubs, or gallops. Mild tachycardia ABDOMEN: Soft, nontender, nondistended. Bowel sounds present. No organomegaly or mass.  EXTREMITIES: No cyanosis, clubbing or edema b/l.    NEUROLOGIC: on the ventilator  PSYCHIATRIC:  patient is sedated  SKIN: No obvious rash, lesion, or ulcer.   LABORATORY PANEL:  CBC Recent Labs  Lab 12/10/2018 0436  WBC 27.8*  HGB 7.4*  HCT 26.3*  PLT 182    Chemistries  Recent Labs  Lab December 23, 2018 1256  12/13/2018 0436  NA 141   < > 141  K 3.3*   < > 5.3*  CL 87*   < > 106  CO2 45*   < > 29   GLUCOSE 211*   < > 239*  BUN 21   < > 90*  CREATININE 0.75   < > 1.74*  CALCIUM 9.0   < > 7.3*  MG  --    < > 2.4  AST 17  --   --   ALT 15  --   --   ALKPHOS 78  --   --   BILITOT 0.6  --   --    < > = values in this interval not displayed.   Cardiac Enzymes Recent Labs  Lab 12/23/18 1256  TROPONINI <0.03   RADIOLOGY:  Dg Abd 1 View  Result Date: 12/09/2018 CLINICAL DATA:  Follow-up ileus EXAM: ABDOMEN - 1 VIEW COMPARISON:  11/28/2018 FINDINGS: Scattered large and small bowel gas is noted. No obstructive changes are identified. Gastric catheter tip is noted within the stomach although the proximal side port lies in the distal esophagus. This could be advanced several cm. Patchy infiltrative changes are noted in the bases. IMPRESSION: Gastric catheter as described. Scattered large and small bowel gas without obstructive change. Electronically Signed   By: Alcide Clever M.D.   On: 12/02/2018 07:53   ASSESSMENT AND PLAN:  [name]  is a 68 y.o. female with a known history of COPD on oxygen presents with a fall.  She was in respiratory distress and was placed on BiPAP for elevated carbon dioxide on ABG.  1.  Acute on chronic hypercarbic respiratory failure.  - PCO2 of 98.   -was emergently intubated and on the ventilator. She failed BiPAP. -Continue IV Solu-Medrol and inhalers and nebulizer -Weaning per intensivist  2.  COPD exacerbation  With possible pneumonia. -  Empiric antibiotics with Zithromax.  - Solu-Medrol and nebulizer treatments.  3.  Acute encephalopathy likely secondary to high carbon dioxide.  - Hold gabapentin and Requip.    4.  Depression.  Continue Zoloft.  Decrease dose of Klonopin.  5.  Type 2 diabetes mellitus.  Put on sliding scale.  6.  Hyperlipidemia on Pravachol  Critically ill. Long-term poor prognosis.  Palliative care team has been meeting with family CODE STATUS: DNR  DVT Prophylaxis: Lovenox  TOTAL TIME TAKING CARE OF THIS PATIENT: *32**  minutes.  >50% time spent on counselling and coordination of care  POSSIBLE D/C IN **??* DAYS, DEPENDING ON CLINICAL CONDITION.  Note: This dictation was prepared with Dragon dictation along with smaller phrase technology. Any transcriptional errors that result from this process are unintentional.  Auburn Bilberry M.D on 12-15-18 at 1:37 PM  Between 7am to 6pm - Pager - [phone]  After 6pm go to [email or web address] - Social research officer, government  [facility]  Office  [phone]  CC: Primary care physician; [name], DOPatient ID: [name], female   DOB: 08/24/1951, 68 y.o.   MRN: [id number]

## 2018-12-28 NOTE — Progress Notes (Signed)
Recruitment maneuver completed pt tol well 

## 2018-12-28 NOTE — Progress Notes (Signed)
Pt extubated to [place] with comfort measures only orders in place. Family at bedside and chaplin called for support. Pt placed on morphine infusion and pt went asystole and apneic at 1510. Pt pronounced by this RN and Charna Elizabeth, RN as witness.

## 2018-12-28 DEATH — deceased

## 2019-01-02 ENCOUNTER — Other Ambulatory Visit: Payer: Self-pay | Admitting: Family Medicine

## 2019-01-02 DIAGNOSIS — I129 Hypertensive chronic kidney disease with stage 1 through stage 4 chronic kidney disease, or unspecified chronic kidney disease: Secondary | ICD-10-CM

## 2019-07-31 ENCOUNTER — Ambulatory Visit: Payer: Self-pay

## 2020-09-30 IMAGING — DX DG CHEST 1V PORT
2 series · 2 of 2 positions shown · non-contrast
Comparison: 11/29/2018

CLINICAL DATA: Acute respiratory failure

EXAM:
PORTABLE CHEST 1 VIEW

[chest ap (1 of 2)]
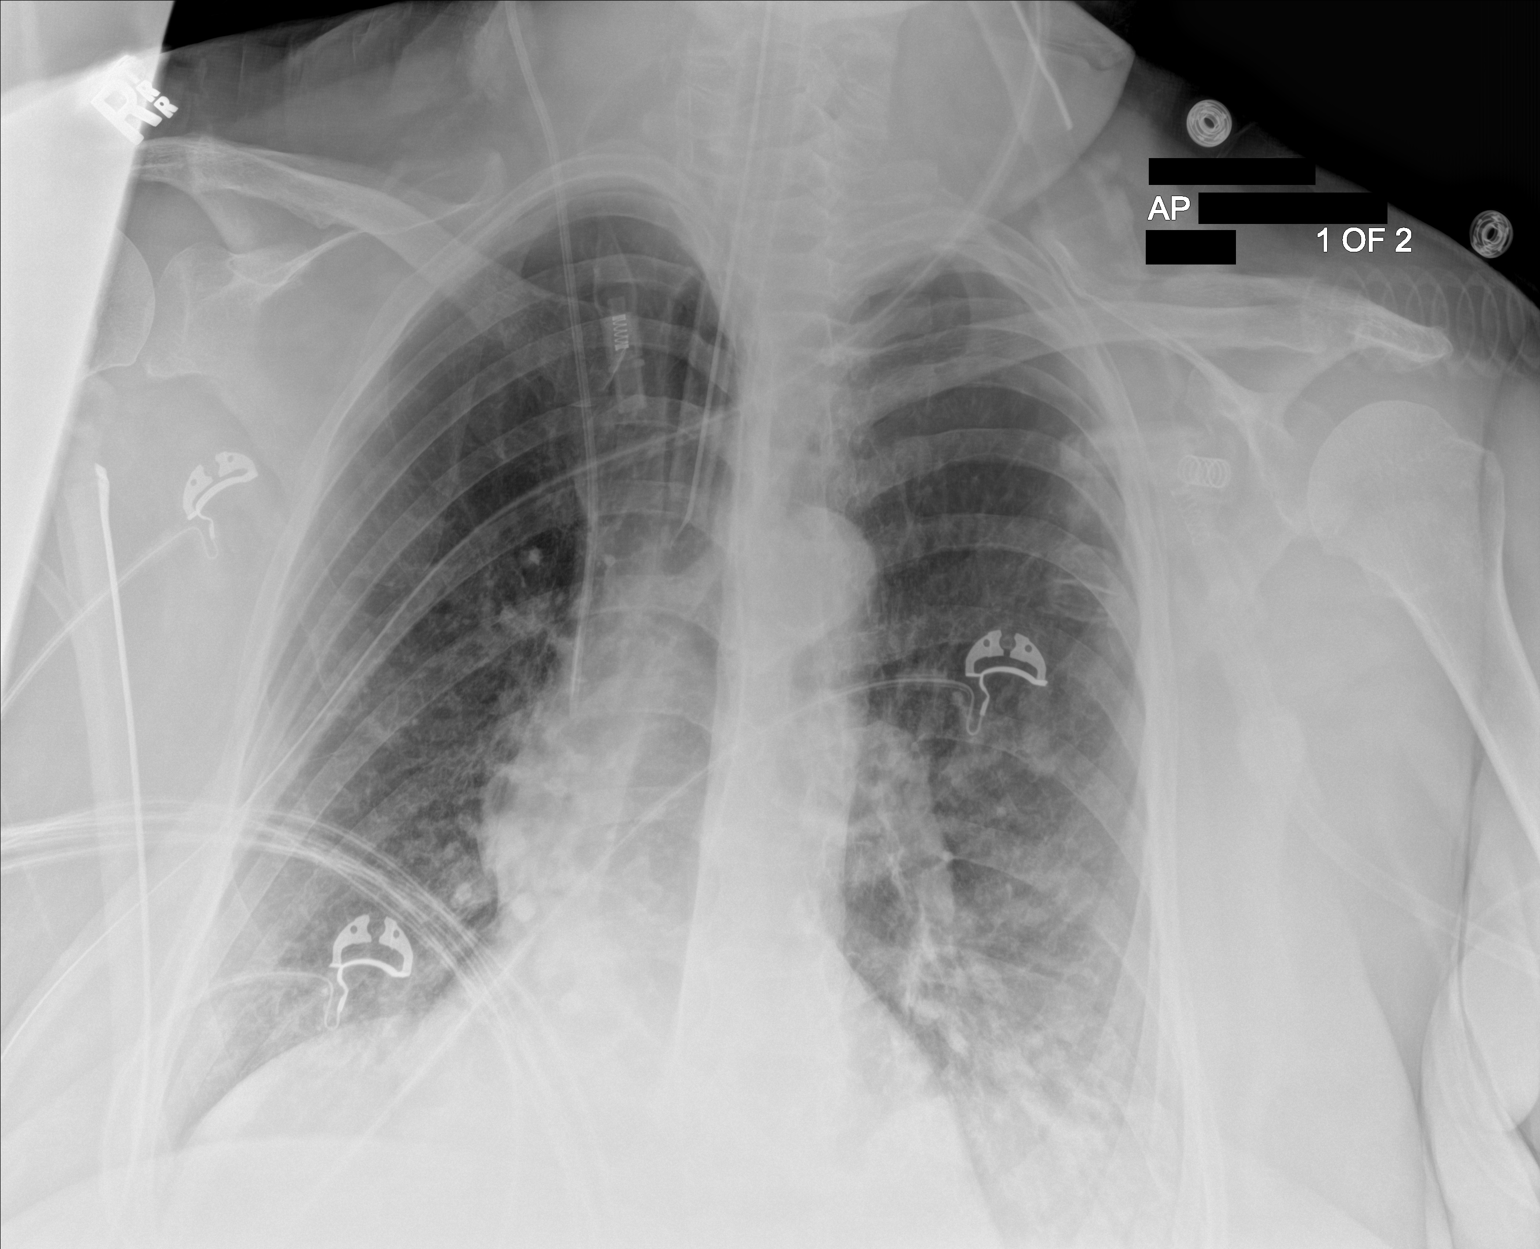

[chest ap (2 of 2)]
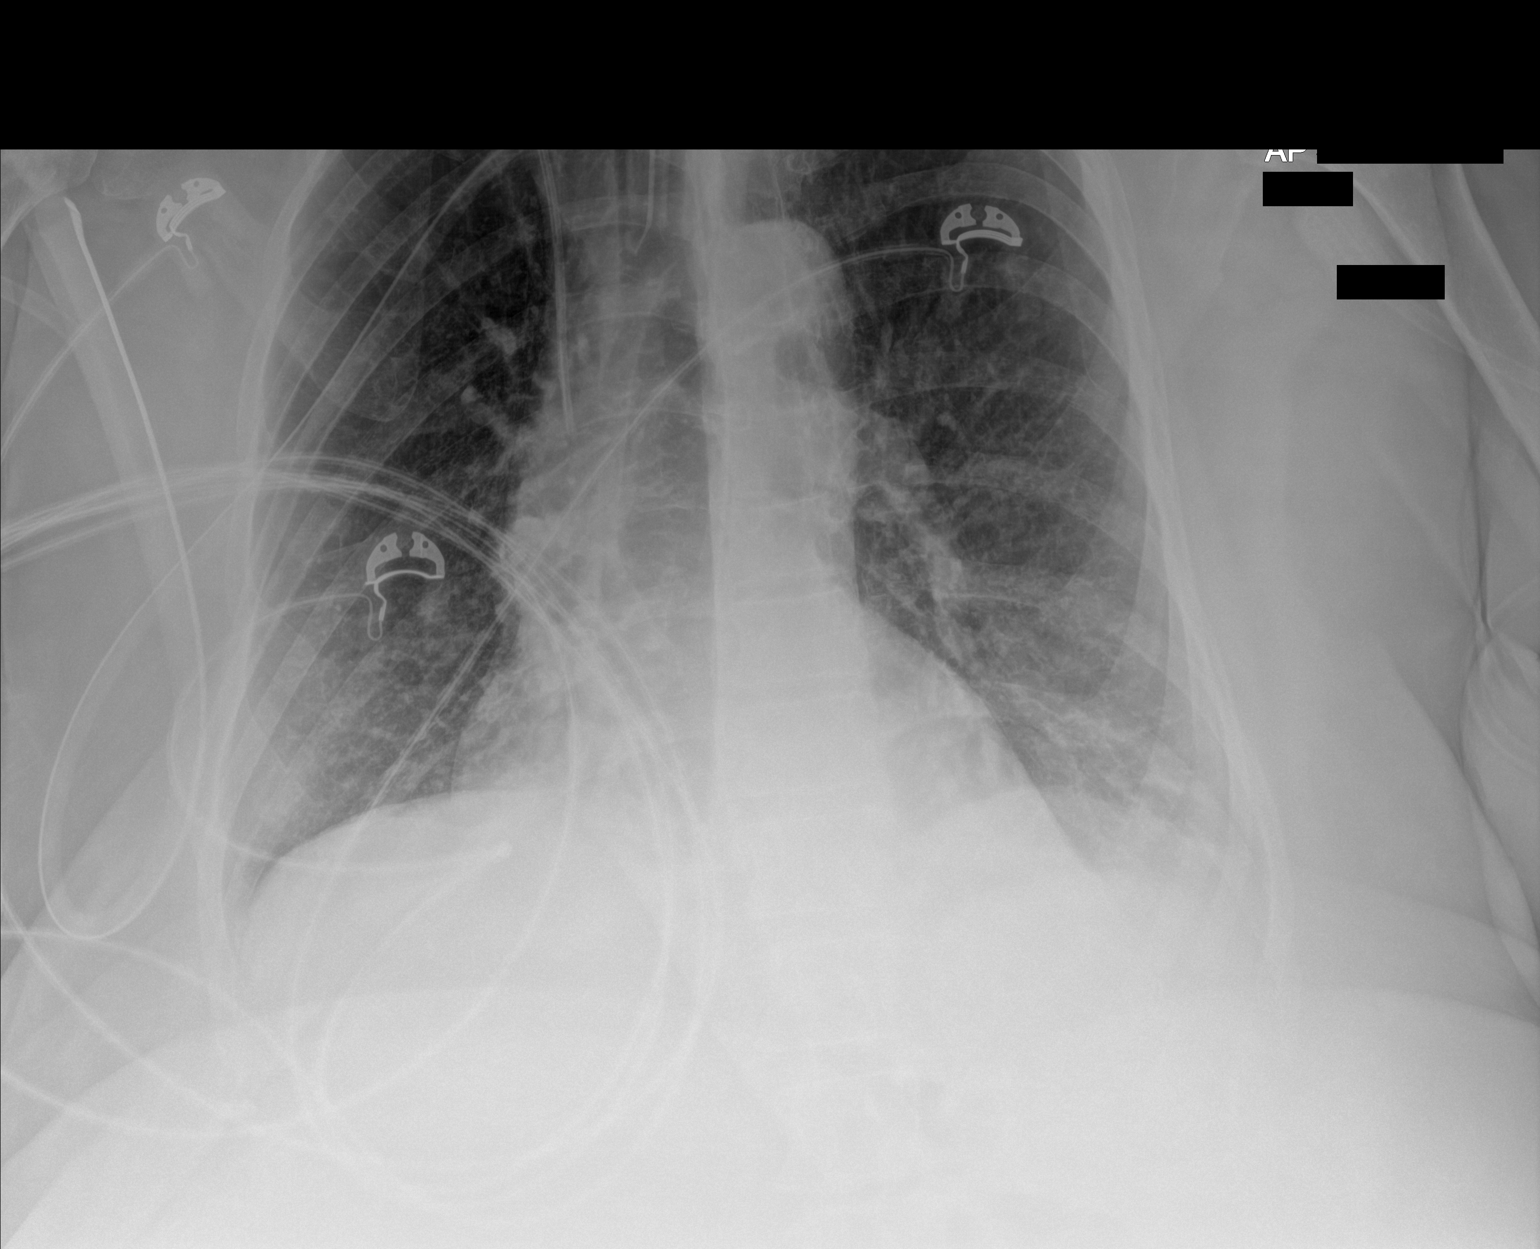

[2 of 2 positions shown; findings below may reference images not displayed]

FINDINGS: Cardiac shadow is stable. Endotracheal tube and nasogastric catheter
are noted in satisfactory position. Right jugular central line is
again seen. Bibasilar atelectasis is seen. Hyperinflation is again
identified. No focal confluent infiltrate is seen.
IMPRESSION: Mild bibasilar atelectasis.

Tubes and lines as described.

## 2020-10-03 IMAGING — DX DG ABDOMEN 1V
3 series · 3 of 3 positions shown · non-contrast
Comparison: 11/28/2018

CLINICAL DATA: Follow-up ileus

EXAM:
ABDOMEN - 1 VIEW

[abdomen supine (1 of 3)]
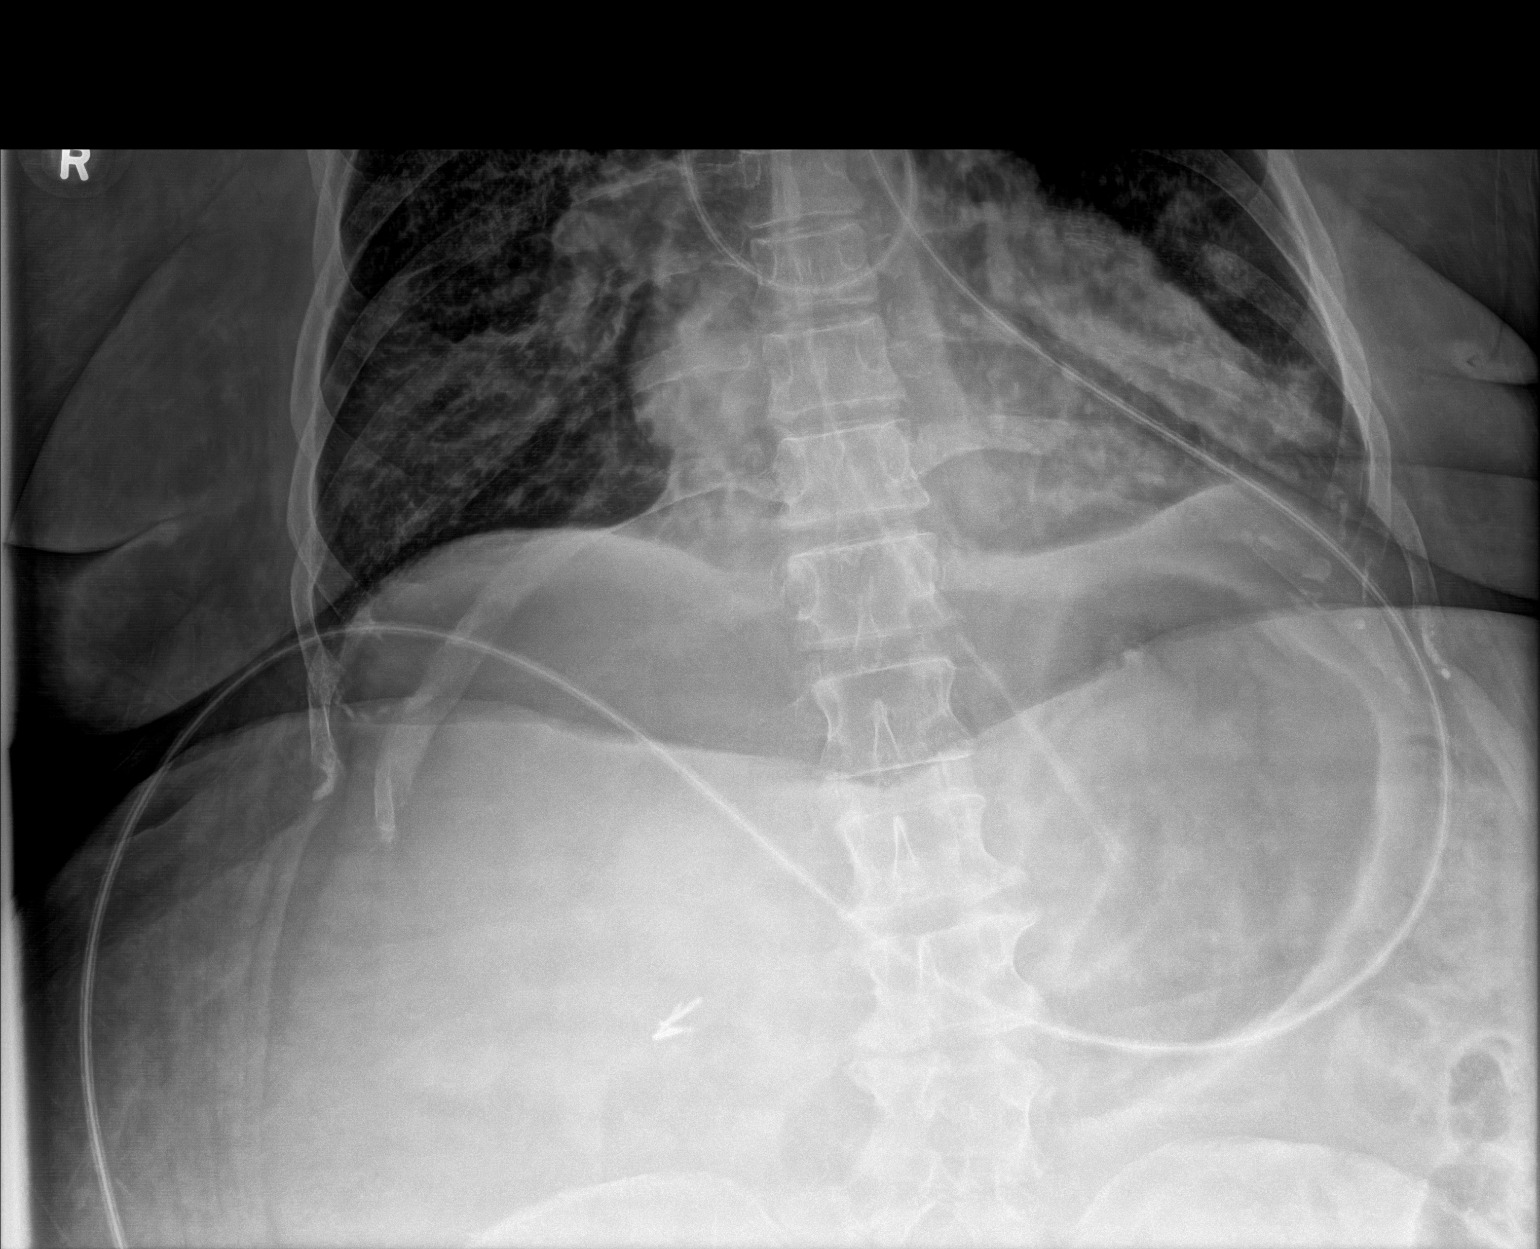

[abdomen supine (2 of 3)]
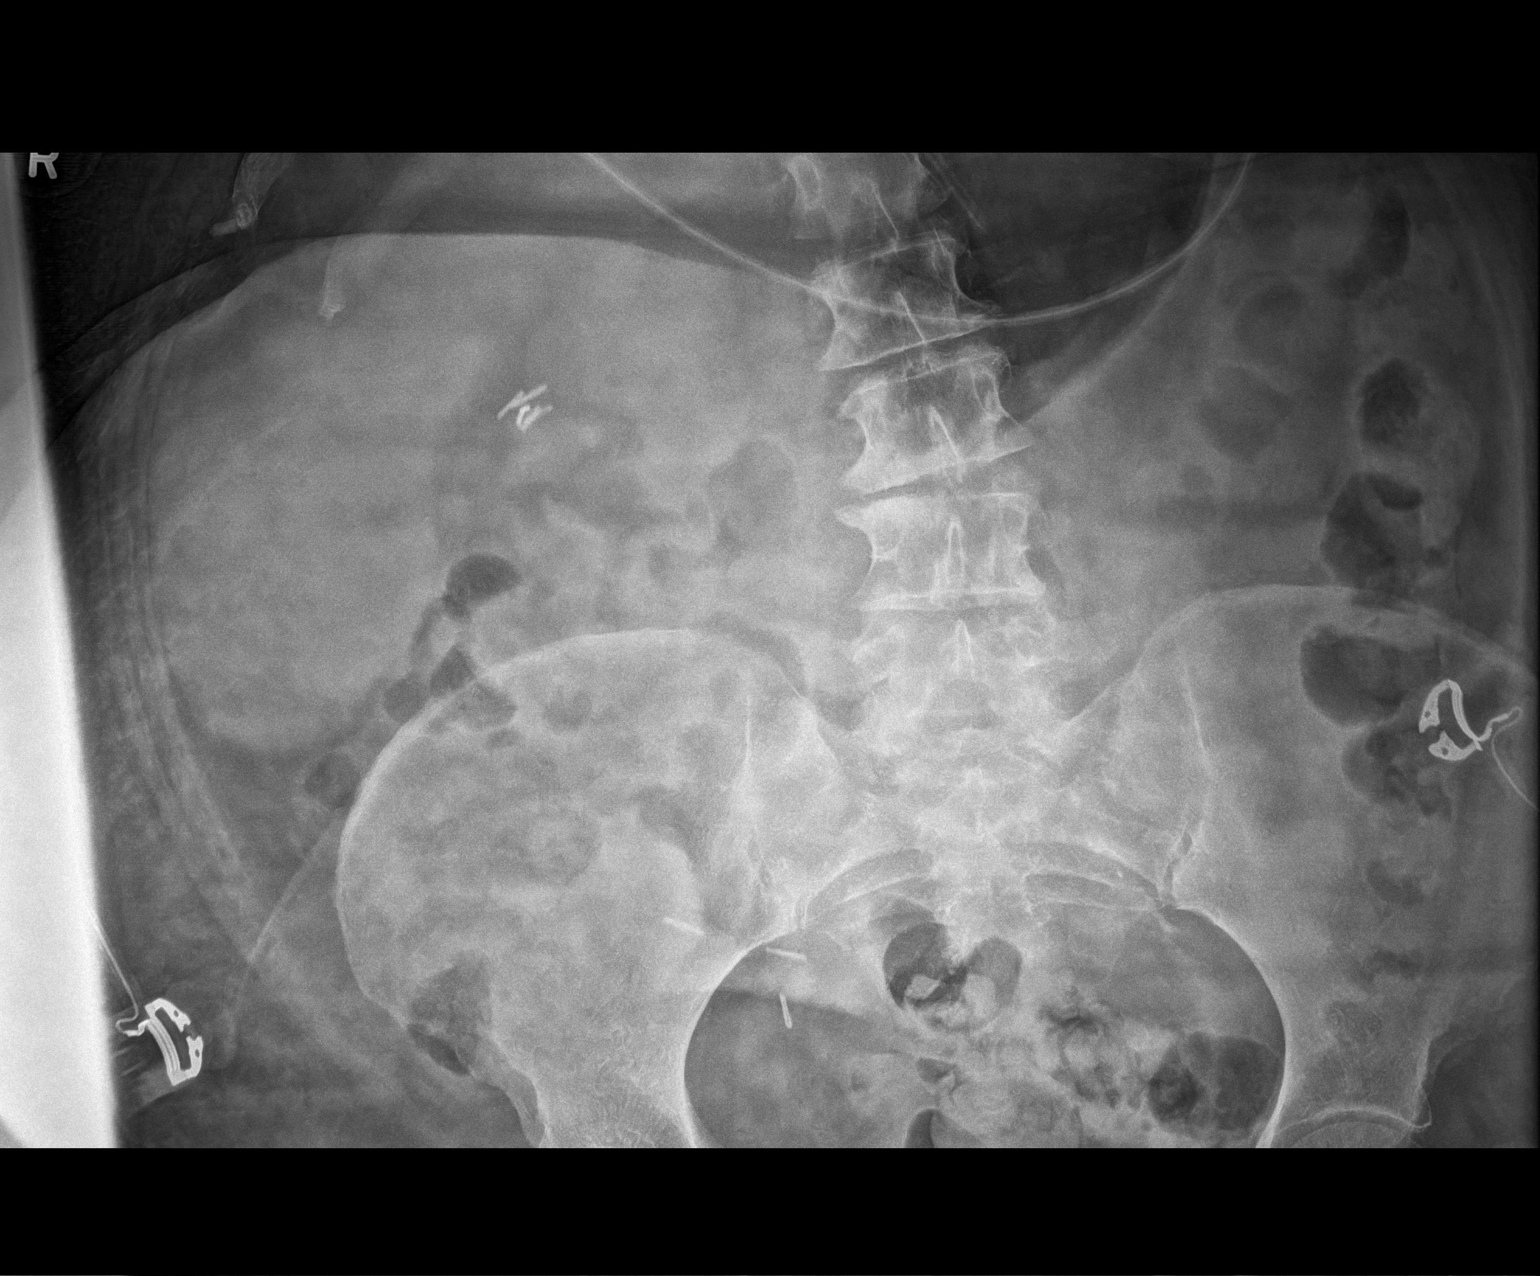

[abdomen supine (3 of 3)]
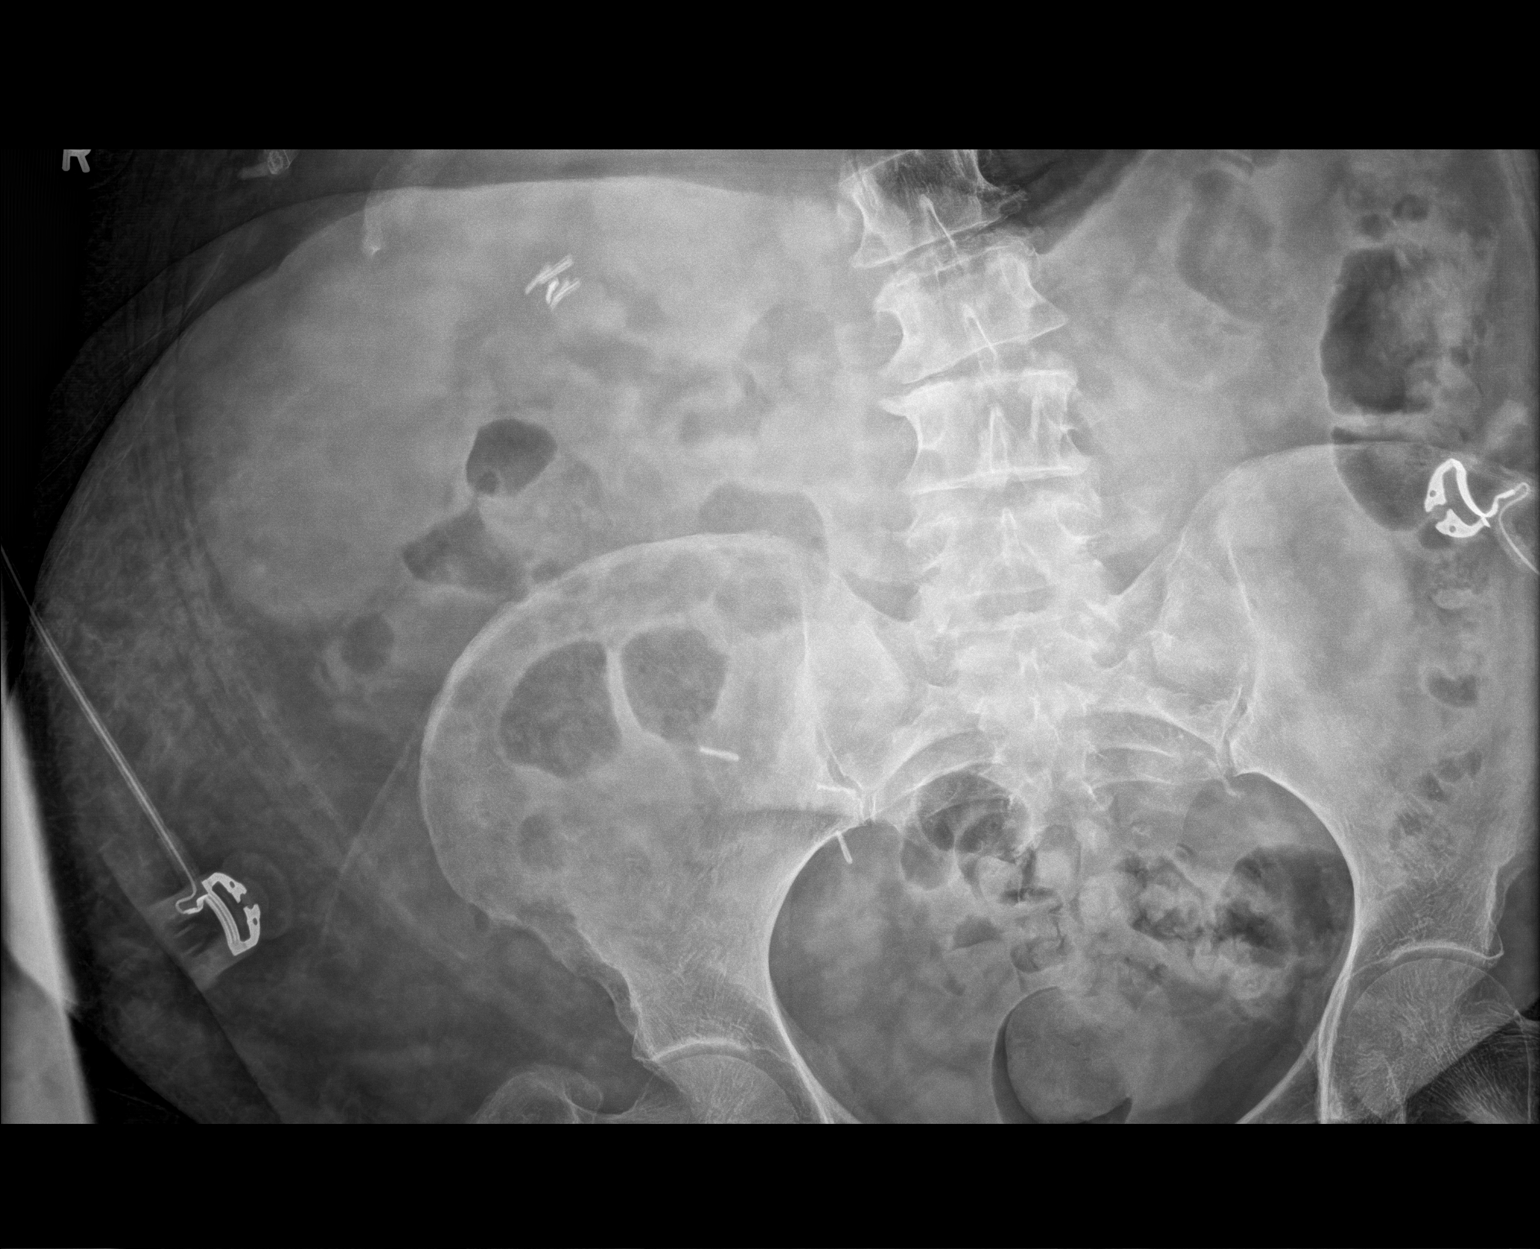

[3 of 3 positions shown; findings below may reference images not displayed]

FINDINGS: Scattered large and small bowel gas is noted. No obstructive changes
are identified. Gastric catheter tip is noted within the stomach
although the proximal side port lies in the distal esophagus. This
could be advanced several cm. Patchy infiltrative changes are noted
in the bases.
IMPRESSION: Gastric catheter as described.

Scattered large and small bowel gas without obstructive change.
